# Patient Record
Sex: Female | Born: 1950 | Race: White | Hispanic: No | Marital: Married | Smoking: Never smoker
Health system: Southern US, Community
[De-identification: ages and names within clinical notes are randomized; demographics above are authoritative.]

## PROBLEM LIST (undated history)

## (undated) DIAGNOSIS — D649 Anemia, unspecified: Secondary | ICD-10-CM

## (undated) DIAGNOSIS — M1990 Unspecified osteoarthritis, unspecified site: Secondary | ICD-10-CM

## (undated) DIAGNOSIS — M839 Adult osteomalacia, unspecified: Secondary | ICD-10-CM

## (undated) DIAGNOSIS — T7840XA Allergy, unspecified, initial encounter: Secondary | ICD-10-CM

## (undated) HISTORY — DX: Unspecified osteoarthritis, unspecified site: M19.90

## (undated) HISTORY — PX: TONSILLECTOMY: SUR1361

## (undated) HISTORY — PX: BREAST SURGERY: SHX581

## (undated) HISTORY — DX: Anemia, unspecified: D64.9

## (undated) HISTORY — DX: Adult osteomalacia, unspecified: M83.9

## (undated) HISTORY — DX: Allergy, unspecified, initial encounter: T78.40XA

## (undated) HISTORY — PX: FOOT SURGERY: SHX648

## (undated) HISTORY — PX: ECTOPIC PREGNANCY SURGERY: SHX613

---

## 1980-09-10 HISTORY — PX: OTHER SURGICAL HISTORY: SHX169

## 1999-09-27 ENCOUNTER — Other Ambulatory Visit: Admission: RE | Admit: 1999-09-27 | Discharge: 1999-09-27 | Payer: Self-pay | Admitting: Podiatry

## 2000-12-18 ENCOUNTER — Other Ambulatory Visit: Admission: RE | Admit: 2000-12-18 | Discharge: 2000-12-18 | Payer: Self-pay | Admitting: Obstetrics and Gynecology

## 2001-01-01 ENCOUNTER — Encounter: Admission: RE | Admit: 2001-01-01 | Discharge: 2001-01-01 | Payer: Self-pay | Admitting: Family Medicine

## 2001-01-01 ENCOUNTER — Encounter: Payer: Self-pay | Admitting: Family Medicine

## 2002-03-03 ENCOUNTER — Other Ambulatory Visit: Admission: RE | Admit: 2002-03-03 | Discharge: 2002-03-03 | Payer: Self-pay | Admitting: Obstetrics and Gynecology

## 2003-03-17 ENCOUNTER — Other Ambulatory Visit: Admission: RE | Admit: 2003-03-17 | Discharge: 2003-03-17 | Payer: Self-pay | Admitting: Obstetrics and Gynecology

## 2004-05-18 ENCOUNTER — Other Ambulatory Visit: Admission: RE | Admit: 2004-05-18 | Discharge: 2004-05-18 | Payer: Self-pay | Admitting: Obstetrics and Gynecology

## 2004-07-13 ENCOUNTER — Ambulatory Visit: Payer: Self-pay | Admitting: Internal Medicine

## 2004-07-27 ENCOUNTER — Ambulatory Visit: Payer: Self-pay | Admitting: Internal Medicine

## 2004-08-31 ENCOUNTER — Encounter (INDEPENDENT_AMBULATORY_CARE_PROVIDER_SITE_OTHER): Payer: Self-pay | Admitting: *Deleted

## 2004-08-31 ENCOUNTER — Ambulatory Visit (HOSPITAL_COMMUNITY): Admission: RE | Admit: 2004-08-31 | Discharge: 2004-08-31 | Payer: Self-pay | Admitting: Obstetrics and Gynecology

## 2004-09-07 ENCOUNTER — Ambulatory Visit: Payer: Self-pay | Admitting: Family Medicine

## 2005-05-24 ENCOUNTER — Other Ambulatory Visit: Admission: RE | Admit: 2005-05-24 | Discharge: 2005-05-24 | Payer: Self-pay | Admitting: Obstetrics and Gynecology

## 2005-10-30 ENCOUNTER — Ambulatory Visit: Payer: Self-pay | Admitting: Family Medicine

## 2006-05-27 ENCOUNTER — Other Ambulatory Visit: Admission: RE | Admit: 2006-05-27 | Discharge: 2006-05-27 | Payer: Self-pay | Admitting: Obstetrics and Gynecology

## 2006-08-16 ENCOUNTER — Ambulatory Visit: Payer: Self-pay | Admitting: Internal Medicine

## 2006-10-11 ENCOUNTER — Ambulatory Visit: Payer: Self-pay | Admitting: Internal Medicine

## 2006-10-17 ENCOUNTER — Ambulatory Visit: Payer: Self-pay | Admitting: Internal Medicine

## 2006-10-29 ENCOUNTER — Encounter (INDEPENDENT_AMBULATORY_CARE_PROVIDER_SITE_OTHER): Payer: Self-pay | Admitting: *Deleted

## 2006-10-29 ENCOUNTER — Ambulatory Visit: Payer: Self-pay | Admitting: Internal Medicine

## 2006-11-28 ENCOUNTER — Ambulatory Visit: Payer: Self-pay | Admitting: Internal Medicine

## 2007-09-09 ENCOUNTER — Ambulatory Visit: Payer: Self-pay | Admitting: Family Medicine

## 2007-09-09 DIAGNOSIS — J069 Acute upper respiratory infection, unspecified: Secondary | ICD-10-CM | POA: Insufficient documentation

## 2007-09-09 DIAGNOSIS — J040 Acute laryngitis: Secondary | ICD-10-CM | POA: Insufficient documentation

## 2007-09-09 DIAGNOSIS — B9689 Other specified bacterial agents as the cause of diseases classified elsewhere: Secondary | ICD-10-CM | POA: Insufficient documentation

## 2008-03-17 ENCOUNTER — Ambulatory Visit: Payer: Self-pay | Admitting: Family Medicine

## 2008-03-17 DIAGNOSIS — J309 Allergic rhinitis, unspecified: Secondary | ICD-10-CM | POA: Insufficient documentation

## 2008-03-17 DIAGNOSIS — M129 Arthropathy, unspecified: Secondary | ICD-10-CM | POA: Insufficient documentation

## 2008-03-17 DIAGNOSIS — K644 Residual hemorrhoidal skin tags: Secondary | ICD-10-CM | POA: Insufficient documentation

## 2008-09-10 DIAGNOSIS — M839 Adult osteomalacia, unspecified: Secondary | ICD-10-CM

## 2008-09-10 HISTORY — DX: Adult osteomalacia, unspecified: M83.9

## 2008-09-26 LAB — HM DEXA SCAN

## 2009-05-27 LAB — HM COLONOSCOPY: HM Colonoscopy: NORMAL

## 2009-10-14 ENCOUNTER — Telehealth: Payer: Self-pay | Admitting: Family Medicine

## 2009-10-19 ENCOUNTER — Ambulatory Visit: Payer: Self-pay | Admitting: Family Medicine

## 2010-05-02 LAB — HM DEXA SCAN

## 2010-10-12 NOTE — Assessment & Plan Note (Signed)
Summary: Madison Rowe // RS  Nurse Visit   Allergies: 1)  ! Codeine  Immunizations Administered:  Zostavax # 1:    Vaccine Type: Zostavax    Site: left deltoid    Mfr: Merck    Dose: 0.5 ml    Route: [place]    Given by: Pura Spice, RN    Exp. Date: 09/28/2010    Lot #: [id number]  Orders Added: 1)  Zoster (Shingles) Vaccine Live [90736] 2)  Admin 1st Vaccine [03474]

## 2010-10-12 NOTE — Progress Notes (Signed)
Summary: SHINGLES VACCINE (ZOSTAVAX) QUESTION  Phone Note Call from Patient   Caller: Patient [phone] Reason for Call: Talk to Nurse, Talk to Doctor Summary of Call: Pt called to inquire about shingles vaccine (Zostavax).... If not available at [facility] then she would like to come by and p/u RX for same so that she can obtain vaccination at [facility].... Pt would like to be contacted to advise @ [phone].  Initial call taken by: Debbra Riding,  October 14, 2009 11:57 AM  Follow-up for Phone Call        we have them here, she needs to check with her insurance company to see if its covered.  If its not then she will to pay for it in advanced Follow-up by: Alfred Levins, CMA,  October 18, 2009 8:22 AM  Additional Follow-up for Phone Call Additional follow up Details #1::        Pt wants to make sure that the Zostavax vaccine will be available if OV is scheduled.... Can you advise? Additional Follow-up by: Debbra Riding,  October 18, 2009 9:26 AM    Additional Follow-up for Phone Call Additional follow up Details #2::    yes we have plenty Follow-up by: Alfred Levins, CMA,  October 18, 2009 10:00 AM  Additional Follow-up for Phone Call Additional follow up Details #3:: Details for Additional Follow-up Action Taken: Attempted to call pt to schedule appt for inj.... LMTCB so appt can be scheduled..... Debbra Riding, October 18, 2009 12:24PM  Pt came in for appt, adv that she is going to do some checking and she will c/b... pt understands cost of $248 plus fee for administering same.... Pt may ck w/ [facility] to see if it may be cheaper... Will c/b to advise...Marland KitchenMarland KitchenDebbra Riding, February 8, 12:29PM  Additional Follow-up by: Debbra Riding,  October 18, 2009 12:25 PM   Appended Document: SHINGLES VACCINE (ZOSTAVAX) QUESTION make appt and come in on 2/9 for shingles injection

## 2010-11-28 ENCOUNTER — Encounter: Payer: Self-pay | Admitting: Internal Medicine

## 2010-11-29 ENCOUNTER — Ambulatory Visit (INDEPENDENT_AMBULATORY_CARE_PROVIDER_SITE_OTHER): Payer: BC Managed Care – PPO | Admitting: Internal Medicine

## 2010-11-29 ENCOUNTER — Encounter: Payer: Self-pay | Admitting: Internal Medicine

## 2010-11-29 DIAGNOSIS — J40 Bronchitis, not specified as acute or chronic: Secondary | ICD-10-CM

## 2010-11-29 MED ORDER — DOXYCYCLINE HYCLATE 100 MG PO TABS: 100.0000 mg | ORAL_TABLET | Freq: Two times a day (BID) | ORAL | Status: AC

## 2010-12-15 ENCOUNTER — Encounter: Payer: Self-pay | Admitting: Internal Medicine

## 2010-12-15 DIAGNOSIS — J40 Bronchitis, not specified as acute or chronic: Secondary | ICD-10-CM | POA: Insufficient documentation

## 2010-12-15 NOTE — Progress Notes (Signed)
  Subjective:    Patient ID: Madison Rowe, female    DOB: [birth year], 60 y.o.   MRN: [id number]  HPI Pt presents to clinic for evaluation of URI sx's. Notes 2wk h/o nasal congestion/drainage and cough productive for green sputum. Denies hemoptysis, wheezing or dyspnea. No allevaiting or exacerbating factors. No sick exposures.    Review of Systems  Constitutional: Negative for fever, chills and fatigue.  HENT: Positive for congestion and rhinorrhea. Negative for facial swelling and ear discharge.   Eyes: Negative for pain and discharge.  Respiratory: Positive for cough. Negative for shortness of breath and wheezing.        Objective:   Physical Exam  Nursing note and vitals reviewed. Constitutional: She appears well-developed and well-nourished. No distress.  HENT:  Head: Normocephalic and atraumatic.  Right Ear: Tympanic membrane, external ear and ear canal normal.  Left Ear: Tympanic membrane, external ear and ear canal normal.  Nose: Nose normal.  Mouth/Throat: Oropharynx is clear and moist. No oropharyngeal exudate.  Eyes: Conjunctivae are normal. Right eye exhibits no discharge. Left eye exhibits no discharge. No scleral icterus.  Neck: Neck supple.  Cardiovascular: Normal rate, regular rhythm and normal heart sounds.  Exam reveals no gallop and no friction rub.   No murmur heard. Pulmonary/Chest: Effort normal and breath sounds normal. No respiratory distress. She has no wheezes. She has no rales.  Lymphadenopathy:    She has no cervical adenopathy.  Skin: Skin is warm and dry. No rash noted. She is not diaphoretic. No erythema.          Assessment & Plan:

## 2010-12-15 NOTE — Assessment & Plan Note (Signed)
Begin doxycycline. F/u if no improvement or worsening.

## 2011-01-26 NOTE — Assessment & Plan Note (Signed)
[facility]                         GASTROENTEROLOGY OFFICE NOTE   NAME:Furia, JAMAL HASKIN                      MRN:          [id number]  DATE:11/28/2006                            DOB:          1951-08-02    HISTORY:  Madison Rowe presents today for a follow-up.  She is a 60 year old  who was evaluated October 11, 2006, for Hemoccult-positive stool and  anemia.  See that dictation for details.  She subsequently underwent  colonoscopy and upper endoscopy October 29, 2006.  Colonoscopy revealed  sigmoid diverticulosis.  No other abnormalities.  The terminal ileum was  normal.  Follow-up colonoscopy in 10 years recommended.  Upper endoscopy  revealed evidence of esophagitis as well as an esophageal stricture and  a small hiatal hernia.  She was also noted to have gastritis and  duodenitis likely secondary to nonsteroidal anti-inflammatory drugs.  There was no evidence of Helicobacter pylori.  She was treated with  Prevacid 30 mg daily as well as iron supplementation.  She was asked to  follow up at this time.  It should be noted that her small bowel biopsy  was normal.  Since her procedure she reports feeling well.  She  apparently had been able to give blood to the [employer].  This would  signify that her anemia has resolved.  She never really did have much in  the way of reflux symptoms.  She does report some intermittent  dysphagia.  No other complaints.  She does have a number of questions  regarding each of the procedures.  We went over each procedure  thoroughly and answered all of her questions.   PHYSICAL EXAMINATION:  GENERAL:  A well-appearing female in no acute  distress.  VITAL SIGNS:  Blood pressure is 128/70.  Her heart rate is 80 and  regular.  Weight is 193.4 pounds.  ABDOMEN:  Not reexamined.   IMPRESSION:  1. 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  Possible gastrointestinal      contributions secondary to nonsteroidal anti-inflammatory drug-      induced mucosal lesions.  2. Gastroesophageal reflux disease with endoscopic evidence of      esophagitis and a stricture.  3. Incidental diverticulosis.   RECOMMENDATIONS:  1. Continue Prevacid 30 mg daily.  This to address her esophagitis as      well as gastroduodenal inflammation.  2. Continue iron supplementation -- indefinitely as long as the      patient plans on continuing her blood doner activity.  3. Consider endoscopy with esophageal dilation should her dysphagia      become significant or persistent despite medication.  Otherwise,      routine office follow-up in one year.     Madison Rowe. Marina Goodell, MD  Electronically Signed    JNP/MedQ  DD: 11/28/2006  DT: 11/28/2006  Job #: [id number]   cc:   Huel Cote, M.D.

## 2011-01-26 NOTE — H&P (Signed)
[name], Rowe               ACCOUNT NO.:  [id number]   MEDICAL RECORD NO.:  [id number]            PATIENT TYPE:   LOCATION:                                 FACILITY:   PHYSICIAN:  Huel Cote, M.D.      DATE OF BIRTH:   DATE OF ADMISSION:  DATE OF DISCHARGE:                                HISTORY & PHYSICAL   SCHEDULED DATE OF SURGERY:  August 31, 2004 at 8:30 a.m. at the [facility].   Madison Rowe is a 60 year old G1 P0 who is being scheduled for an outpatient  removal of a vaginal lesion and a vulvar biopsy.  The patient was first  noted to have this lesion on her physical exam in September 2005 and had a  new lesion approximately 2-3 cm in length extending down from her midline  which appeared to be benign in nature.  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   PAST MEDICAL HISTORY:  Significant for allergies, arthritis, premature  ovarian failure, and osteopenia.   PAST OBSTETRICAL HISTORY:  She had one ectopic pregnancy and subsequently  adopted two children.   PAST GYNECOLOGICAL HISTORY:  Foot surgery.  She had an ectopic with a  salpingo-oophorectomy, a tuboplasty, laparoscopies x10, cystectomy, and a  lumpectomy.   PAST SURGICAL HISTORY:  No abnormal Pap smears.   FAMILY HISTORY:  Significant for no breast cancer or colon cancer or heart  disease.   Her health maintenance is up to date.   CURRENT MEDICATIONS:  1.  Prempro 0.3/1.5.  2.  Premarin cream.   ALLERGIES:  CODEINE.   PHYSICAL EXAMINATION:  VITAL SIGNS:  Weight 184 pounds, blood pressure is  130/90.  CARDIAC:  Regular rate and rhythm.  LUNGS:  Clear.  ABDOMEN:  Soft and nontender.  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  Cervix  has no lesions and there is this anterior vaginal wall mass which  extends  down from the midline approximately 2-3 cm in length.  The uterus is normal  size and adnexa have no masses.   The patient was counseled as to her options including removal and expectant  management of this vaginal lesion.  She feels that it has grown in size over  the past several months and is desirous of complete removal if possible.  Also, we will be proceeding with a vulvar biopsy to confirm the diagnosis of  lichen sclerosis which she treats with triamcinolone cream with some  success.  I have discussed with her the risks of bleeding and infection and  possible damage to bladder.  The patient understands these risks and desires  to proceed with surgery as stated.     Madison Rowe   KR/MEDQ  D:  08/28/2004  T:  08/28/2004  Job:  [id number]

## 2011-01-26 NOTE — Op Note (Signed)
NAME:  Madison Rowe, Madison Rowe               ACCOUNT NO.:  [id number]   MEDICAL RECORD NO.:  [id number]          PATIENT TYPE:  AMB   LOCATION:  DAY                          FACILITY:  [facility]   PHYSICIAN:  Huel Cote, M.D. DATE OF BIRTH:  1951-03-07   DATE OF PROCEDURE:  08/31/2004  DATE OF DISCHARGE:                                 OPERATIVE REPORT   PREOPERATIVE DIAGNOSES:  1.  Anterior vaginal wall mass.  2.  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   POSTOPERATIVE DIAGNOSES:  1.  Anterior vaginal wall mass.  2.  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  3.  Cystoscope confirming urethral diverticulum.   PROCEDURE:  1.  Excision of vaginal wall mass.  2.  Vulvar lesion.  3.  Cystoscope.   SURGEON:  Huel Cote, M.D. with Lynelle Smoke I. Patsi Sears, M.D. performing  the cystoscope.   ANESTHESIA:  LMAC.   SPECIMENS:  Vaginal wall lesion and a vulva biopsy.   ESTIMATED BLOOD LOSS:  50 mL.   COMPLICATIONS:  None.   DESCRIPTION OF PROCEDURE:  The patient was taken to the operating room where  [facility] anesthesia was obtained without difficulty.  She was then prepped and  draped in the normal sterile fashion in the dorsal lithotomy position.  The  patient was then carefully examined and it was noted that the previous  anterior vaginal mass was substantially larger in size and edematous. This  was traced back to its base and off a small stalk adjacent to the bladder  neck possibly. Given its close proximity to the bladder neck though it did  not appear to be confluent with the urethra.  Dr. Patsi Sears, urology, was  called into the room and agreed that the patient should undergo a cystoscope  after removal to confirm no urethral diverticulum present.  Therefore the  patient was numbed with 1% lidocaine with epinephrine all along the base of  the lesion and it was excised with Mayo scissors. The defect left was very  small and was able to be closed with two interrupted sutures of  2-0 Vicryl  with minimal bleeding.  The mass was then handed off to pathology and  attention was turned to the patient's vulva. A small 2 mm biopsy was  obtained in an area that looked consistent with Lichen sclerosis for a  confirmatory diagnosis and this was also closed with two interrupted sutures  of 2-0 Vicryl. At this point, Dr. Patsi Sears came to the room and performed  a flexible  cystoscopy and noted no defects in the urethra which was consistent with  being an anterior vaginal wall cyst or lesion which had possibly torsed and  become more edematous.  At this point, all instruments and sponges were  removed from the patient's vagina and she was awakened and taken to the  recovery room in stable condition.     Gary Fleet   KR/MEDQ  D:  08/31/2004  T:  08/31/2004  Job:  [id number]

## 2011-01-26 NOTE — Consult Note (Signed)
NAME:  Madison Rowe, Madison Rowe               ACCOUNT NO.:  [id number]   MEDICAL RECORD NO.:  [id number]          PATIENT TYPE:  AMB   LOCATION:  DAY                          FACILITY:  [facility]   PHYSICIAN:  Sigmund I. Patsi Sears, M.D.DATE OF BIRTH:  1950-10-22   DATE OF CONSULTATION:  08/31/2004  DATE OF DISCHARGE:                                   CONSULTATION   SUBJECTIVE:  Patient with torsion of vaginal mass for excision today.  The  area of the space of the abnormality appears to be attached to the urethra  and intraoperative consultation for possible urethral diverticulum was  obtained by Dr. Senaida Ores.   OBJECTIVE:  Examination today shows a 2 cm x 1 cm edematous cystic mass,  torsed, with the base thickened, and appears to be attached to the vaginal  epithelium overlying the urethra. There is no urethral excrescence noted.  There is no history of urinary tract infections or stone.   Cystoscopy is accomplished, and shows that the patient has a normal  appearing urethra, normal bladder neck. The ureteral orifices were in normal  position, with clear efflux from both orifices. There is no evidence of  bladder stone, tumor or bladder diverticular formation. There is no urethral  diverticulum.  The scope is removed. The patient then was awakened and taken  to the recovery room in good condition.     Sigm   SIT/MEDQ  D:  08/31/2004  T:  08/31/2004  Job:  [id number]   cc:   Huel Cote, M.D.  [address] [place], [address]  [place], [place] [address]  Fax: [phone]

## 2011-01-26 NOTE — Assessment & Plan Note (Signed)
[facility]                         GASTROENTEROLOGY OFFICE NOTE   NAME:Madison Rowe, Madison Rowe                      MRN:          [id number]  DATE:10/11/2006                            DOB:          1951-03-10    REFERRING PHYSICIAN:  Huel Cote, M.D.   REASON FOR CONSULTATION:  Hemoccult-positive stool.   HISTORY:  This is a pleasant, 60 year old white female with no  significant past medical history.  She is referred through the courtesy  of Dr. Senaida Ores regarding Hemoccult-positive stool.  The patient  reports long-standing history of blood donation to the [employer].  In  recent months, she was declined giving blood, due to anemia.  She was  found to have iron deficiency and placed on iron supplementation.  She  recently underwent home Hemoccult testing with three of three studies  returning positive.  She does take occasional nonsteroidal anti-  inflammatory drugs and aspirin.  She was using these prior to stool  testing.   Her GI review of systems is quite unremarkable.  No nausea, vomiting,  heartburn, indigestion, dysphagia, abdominal pain, melena, hematochezia,  constipation, diarrhea or weight-loss.  She has noticed trivial amounts  of red blood on the tissue with defecation.  The patient did undergo  routine screening colonoscopy in November of 2005.  The examination was  normal, except for mild diverticulosis.   PAST MEDICAL HISTORY:  Unremarkable.   PAST SURGICAL HISTORY:  1. Foot surgeries.  2. Laparoscopy.  3. Tuboplasty.  4. Lumpectomy.   ALLERGIES:  CODEINE.   CURRENT MEDICATIONS:  Prempro, Premarin cream, buffered aspirin,  Citrucel, and iron sulfate.   SOCIAL HISTORY:  The patient is married with two children.  She is an  Programmer, systems.  She does not smoke.  Occasionally uses alcohol.   FAMILY HISTORY:  Negative for gastrointestinal malignancy.   PHYSICAL EXAM:  Well-appearing female in no acute distress.  Blood  pressure is 118/64, heart rate is 84, weight is 195 pounds.  HEENT:  Sclerae are anicteric.  Conjunctivae are pink.  Oral mucosa  intact.  LUNGS:  Clear.  HEART:  Regular.  ABDOMEN:  Soft, without tenderness, masses or hernia.  EXTREMITIES:  Without edema.   IMPRESSION:  This is a 60 year old female, who presents regarding iron  deficiency anemia and Hemoccult-positive stool.  Negative screening  colonoscopy a little over two years ago.  Though it is quite conceivable  that her occult-positive stool is due to minor GI mucosal irritation  from aspirin and nonsteroidals and that her iron deficiency anemia is  due to chronic blood donation to the [employer], a significant underlying  gastrointestinal mucosal lesion must be excluded.  However, this seems  somewhat less likely, given her negative review of systems and physical  exam.  In any event, I think it is important that we proceed to exclude  such a process.   RECOMMENDATIONS:  1. Colonoscopy and upper endoscopy.  The nature of the procedures, as      well as the risks, benefits, and alternatives have been reviewed.      She understood and agreed to proceed.  2. Continue iron therapy, as long as the patient plans to continue as      a blood donor.     Wilhemina Bonito. Marina Goodell, MD  Electronically Signed    JNP/MedQ  DD: 10/11/2006  DT: 10/11/2006  Job #: [id number]   cc:   Huel Cote, M.D.

## 2011-07-28 ENCOUNTER — Encounter: Payer: Self-pay | Admitting: Family Medicine

## 2011-07-28 ENCOUNTER — Ambulatory Visit (INDEPENDENT_AMBULATORY_CARE_PROVIDER_SITE_OTHER): Payer: BC Managed Care – PPO | Admitting: Family Medicine

## 2011-07-28 VITALS — BP 118/76 | HR 80 | Temp 97.5°F | Wt 183.8 lb

## 2011-07-28 DIAGNOSIS — J069 Acute upper respiratory infection, unspecified: Secondary | ICD-10-CM

## 2011-07-28 MED ORDER — BENZONATATE 100 MG PO CAPS: 100.0000 mg | ORAL_CAPSULE | Freq: Three times a day (TID) | ORAL | Status: DC | PRN

## 2011-07-28 MED ORDER — FLUTICASONE PROPIONATE 50 MCG/ACT NA SUSP: 2.0000 | Freq: Every day | NASAL | Status: DC

## 2011-07-28 NOTE — Progress Notes (Signed)
  Subjective:    Patient ID: Madison Rowe, female    DOB: 1951/02/24, 60 y.o.   MRN: [id number]  HPI CC: cold?  5d h/o ST, drainage down back of throat, now with coughing and congestion.  Feeling fatigued.  ST continues to bother her.  Minimal HA.  Cough slightly productive - started last night.  Has tried mucinex, nyquil, benadryl, cepacol throat spray and lozenges.  No fevers/chills, abd pain, n/v/d, rashes, myalgia, arthralgia.    No sick contacts at home.  No smokers at home.  No asthma/copd.  + seasonal allergies.  Review of Systems Per HPI    Objective:   Physical Exam  Vitals reviewed. Constitutional: She appears well-developed and well-nourished. No distress.  HENT:  Head: Normocephalic and atraumatic.  Right Ear: Hearing, external ear and ear canal normal.  Left Ear: Hearing, tympanic membrane, external ear and ear canal normal.  Nose: Mucosal edema (significantly swollen R turbinate) present. No rhinorrhea. Right sinus exhibits no maxillary sinus tenderness and no frontal sinus tenderness. Left sinus exhibits no maxillary sinus tenderness and no frontal sinus tenderness.  Mouth/Throat: Uvula is midline and mucous membranes are normal. Posterior oropharyngeal erythema present. No oropharyngeal exudate, posterior oropharyngeal edema or tonsillar abscesses.       R cerumen impaction, asxs  Eyes: Conjunctivae and EOM are normal. Pupils are equal, round, and reactive to light. No scleral icterus.  Neck: Normal range of motion. Neck supple. No JVD present. No thyromegaly present.  Cardiovascular: Normal rate, regular rhythm, normal heart sounds and intact distal pulses.   No murmur heard. Pulmonary/Chest: Effort normal and breath sounds normal. No respiratory distress. She has no wheezes. She has no rales.  Lymphadenopathy:    She has no cervical adenopathy.  Skin: Skin is warm and dry. No rash noted.  Psychiatric: She has a normal mood and affect.       Assessment &  Plan:

## 2011-07-28 NOTE — Patient Instructions (Signed)
Sounds like you have a viral upper respiratory infection. Antibiotics are not needed for this.  Viral infections usually take 7-10 days to resolve.  The cough can a few several weeks to go away. Use medication as prescribed: flonase for nasal swelling. Take ibuprofen for throat discomfort and nasal saline irrigation throughout the day. Continue nyquil and mucinex. Push fluids and plenty of rest. Please return if you are not improving as expected, or if you have high fevers (>101.5) or difficulty swallowing or worsening productive cough. Call clinic with questions.  Good to see you today.

## 2011-07-28 NOTE — Assessment & Plan Note (Signed)
Likely viral urti with cough. See pt instructions. Update [place] if not improving as expected.

## 2011-07-30 ENCOUNTER — Telehealth: Payer: Self-pay

## 2011-07-30 NOTE — Telephone Encounter (Signed)
[facility] Triage Call Report Triage Record Num: [id number] Operator: Ether Griffins Patient Name: Madison Rowe Call Date & Time: 07/28/2011 10:53:56AM Patient Phone: [phone] PCP: Rickard Patience Patient Gender: Female PCP Fax : [phone] Patient DOB: 14-Jun-1951 Practice Name: [facility] Reason for Call: Caller: Leonora/Patient; PCP: Dianna Limbo [facility]); CB#: [phone]; Call Reason: Sore Throat; Sx Onset: 07/24/2011; Sx Notes:calling about sore throat,sinus drainage,cough, no energy. Afebrile. Home treatment(s) tried: Benadryl, Nyquil and Advil sore throat spray; Did home treatment help?: No.Guideline Used:All emergent sxs of Sore throat r/o.Advised to call scheduling line at office for appt today. Protocol(s) Used: Sore Throat or Hoarseness Recommended Outcome per Protocol: See Provider within 24 hours Reason for Outcome: Exposure to an individual with diagnosed strep throat but who has not completed 24 hours of antibiotic therapy Care Advice: Call provider if any of the following signs and symptoms develop: severe sore throat, enlarged tonsils with white or yellow patches, tender swollen glands, fever, generally feel ill, persistent low grade headache, or abdominal pain. ~ ~ SYMPTOM / CONDITION MANAGEMENT ~ INFECTION CONTROL ~ CAUTIONS Most adults need to drink 6-10 eight-ounce glasses (1.2-2.0 liters) of fluids per day unless previously told to limit fluid intake for other medical reasons. Limit fluids that contain caffeine, sugar or alcohol. Urine will be a very light yellow color when you drink enough fluids. ~ Sore Throat Relief: - Use warm salt water gargles 3 to 4 times/day, as needed (1/2 tsp. salt in 8 oz. [.2 liters] water). - Suck on hard candy, nonprescription or herbal throat lozenges (sugar-free if diabetic) - Eat soothing, soft food/fluids (broths, soups, or honey and lemon juice in hot tea, Popsicles, frozen yogurt or sherbet, scrambled eggs,  cooked cereals, Jell-O or puddings) whichever is most comforting. - Avoid eating salty, spicy or acidic foods. ~ 07/28/2011 11:07:04AM Page 1 of 1 [id number]

## 2011-08-13 ENCOUNTER — Encounter: Payer: Self-pay | Admitting: Internal Medicine

## 2011-08-13 DIAGNOSIS — Z0000 Encounter for general adult medical examination without abnormal findings: Secondary | ICD-10-CM | POA: Insufficient documentation

## 2011-08-14 ENCOUNTER — Encounter: Payer: BC Managed Care – PPO | Admitting: Internal Medicine

## 2011-08-14 ENCOUNTER — Encounter: Payer: Self-pay | Admitting: Internal Medicine

## 2011-08-14 ENCOUNTER — Ambulatory Visit (INDEPENDENT_AMBULATORY_CARE_PROVIDER_SITE_OTHER): Payer: BC Managed Care – PPO | Admitting: Endocrinology

## 2011-08-14 VITALS — BP 102/64 | HR 78 | Temp 97.9°F | Ht 64.0 in | Wt 182.5 lb

## 2011-08-14 DIAGNOSIS — J069 Acute upper respiratory infection, unspecified: Secondary | ICD-10-CM

## 2011-08-14 MED ORDER — AZITHROMYCIN 500 MG PO TABS: 500.0000 mg | ORAL_TABLET | Freq: Every day | ORAL | Status: AC

## 2011-08-14 NOTE — Progress Notes (Signed)
  Subjective:    Patient ID: Madison Rowe, female    DOB: 02-21-1951, 60 y.o.   MRN: [id number]  HPI Pt states few weeks of moderate congestion in the nose, and assoc sore throat.  The sore throat is improved, but the nasal congestion persists.   Past Medical History  Diagnosis Date  . Allergy   . Arthritis     Past Surgical History  Procedure Date  . Breast surgery   . Ectopic pregnancy surgery   . Tonsillectomy     History   Social History  . Marital Status: Married    Spouse Name: N/A    Number of Children: N/A  . Years of Education: N/A   Occupational History  . Not on file.   Social History Main Topics  . Smoking status: Never Smoker   . Smokeless tobacco: Not on file  . Alcohol Use: Yes     Occasional  . Drug Use: No  . Sexually Active: Not on file   Other Topics Concern  . Not on file   Social History Narrative  . No narrative on file    Current Outpatient Prescriptions on File Prior to Visit  Medication Sig Dispense Refill  . benzonatate (TESSALON PERLES) 100 MG capsule Take 1 capsule (100 mg total) by mouth 3 (three) times daily as needed for cough.  30 capsule  0  . Calcium Carbonate-Vitamin D (CALCIUM + D PO) Take by mouth 2 (two) times daily.        . fluticasone (FLONASE) 50 MCG/ACT nasal spray Place 2 sprays into the nose daily.  16 g  1    Allergies  Allergen Reactions  . Codeine     No family history on file.  There were no vitals taken for this visit.    Review of Systems Denies fever or cough    Objective:   Physical Exam  BP  Pulse  Temp(Src)  Ht  Wt  BMI   102/64  78  97.9 F (36.6 C) (Oral)  5\' 4"  (1.626 m)  182 lb 8 oz (82.781 kg)  31.33 kg/m2      VITAL SIGNS:  See vs page GENERAL: no distress head: no deformity eyes: no periorbital swelling, no proptosis external nose and ears are normal mouth: no lesions Both tm's are slightly red NECK: There is no palpable thyroid enlargement.  No thyroid nodule is palpable.  No  palpable lymphadenopathy at the anterior neck       Assessment & Plan:  Glenford Peers, new

## 2011-08-14 NOTE — Patient Instructions (Signed)
i have sent a prescription to your pharmacy, for an antibiotic pill. Loratadine-d (non-prescription) will help your congestion. I hope you feel better soon.  If you don't feel better by next week, please call dr Jonny Ruiz.

## 2011-09-09 NOTE — Progress Notes (Signed)
  Subjective:    Patient ID: Madison Rowe, female    DOB: [birth year], 60 y.o.   MRN: [id number]  HPI error    Review of Systems     Objective:   Physical Exam        Assessment & Plan:

## 2011-09-17 ENCOUNTER — Ambulatory Visit: Payer: BC Managed Care – PPO | Admitting: Internal Medicine

## 2011-09-27 ENCOUNTER — Encounter: Payer: Self-pay | Admitting: Internal Medicine

## 2011-09-27 ENCOUNTER — Other Ambulatory Visit (INDEPENDENT_AMBULATORY_CARE_PROVIDER_SITE_OTHER): Payer: BC Managed Care – PPO

## 2011-09-27 ENCOUNTER — Ambulatory Visit (INDEPENDENT_AMBULATORY_CARE_PROVIDER_SITE_OTHER): Payer: BC Managed Care – PPO | Admitting: Internal Medicine

## 2011-09-27 DIAGNOSIS — E785 Hyperlipidemia, unspecified: Secondary | ICD-10-CM | POA: Insufficient documentation

## 2011-09-27 DIAGNOSIS — D539 Nutritional anemia, unspecified: Secondary | ICD-10-CM | POA: Insufficient documentation

## 2011-09-27 DIAGNOSIS — D649 Anemia, unspecified: Secondary | ICD-10-CM

## 2011-09-27 DIAGNOSIS — E782 Mixed hyperlipidemia: Secondary | ICD-10-CM

## 2011-09-27 DIAGNOSIS — M810 Age-related osteoporosis without current pathological fracture: Secondary | ICD-10-CM

## 2011-09-27 DIAGNOSIS — J309 Allergic rhinitis, unspecified: Secondary | ICD-10-CM

## 2011-09-27 LAB — CBC WITH DIFFERENTIAL/PLATELET
Basophils Relative: 0.8 % (ref 0.0–3.0)
Eosinophils Relative: 1.3 % (ref 0.0–5.0)
HCT: 34.8 % — ABNORMAL LOW (ref 36.0–46.0)
Hemoglobin: 11.9 g/dL — ABNORMAL LOW (ref 12.0–15.0)
MCV: 84.9 fl (ref 78.0–100.0)
Monocytes Absolute: 0.5 10*3/uL (ref 0.1–1.0)
Neutro Abs: 2.7 10*3/uL (ref 1.4–7.7)
Neutrophils Relative %: 55.6 % (ref 43.0–77.0)
RBC: 4.09 Mil/uL (ref 3.87–5.11)
WBC: 4.9 10*3/uL (ref 4.5–10.5)

## 2011-09-27 LAB — FOLATE: Folate: 16.7 ng/mL (ref >5.9)

## 2011-09-27 LAB — COMPREHENSIVE METABOLIC PANEL
ALT: 15 U/L (ref 0–35)
AST: 18 U/L (ref 0–37)
Albumin: 4.1 g/dL (ref 3.5–5.2)
BUN: 18 mg/dL (ref 6–23)
Calcium: 9.1 mg/dL (ref 8.4–10.5)
Chloride: 103 mEq/L (ref 96–112)
Potassium: 4.5 mEq/L (ref 3.5–5.1)

## 2011-09-27 LAB — LDL CHOLESTEROL, DIRECT: Direct LDL: 122.7 mg/dL

## 2011-09-27 LAB — LIPID PANEL: HDL: 65.6 mg/dL (ref >39.00)

## 2011-09-27 MED ORDER — CETIRIZINE-PSEUDOEPHEDRINE ER 5-120 MG PO TB12: 1.0000 | ORAL_TABLET | Freq: Two times a day (BID) | ORAL | Status: DC

## 2011-09-27 MED ORDER — FLUTICASONE PROPIONATE 50 MCG/ACT NA SUSP: 2.0000 | Freq: Every day | NASAL | Status: DC

## 2011-09-27 MED ORDER — IBANDRONATE SODIUM 150 MG PO TABS: 150.0000 mg | ORAL_TABLET | ORAL | Status: AC

## 2011-09-27 NOTE — Patient Instructions (Signed)
Anemia, Nonspecific Your exam and blood tests show you are anemic. This means your blood (hemoglobin) level is low. Normal hemoglobin values are 12 to 15 g/dL for females and 14 to 17 g/dL for males. Make a note of your hemoglobin level today. The hematocrit percent is also used to measure anemia. A normal hematocrit is 38% to 46% in females and 42% to 49% in males. Make a note of your hematocrit level today. CAUSES  Anemia can be due to many different causes.  Excessive bleeding from periods (in women).   Intestinal bleeding.   Poor nutrition.   Kidney, thyroid, liver, and bone marrow diseases.  SYMPTOMS  Anemia can come on suddenly (acute). It can also come on slowly. Symptoms can include:  Minor weakness.   Dizziness.   Palpitations.   Shortness of breath.  Symptoms may be absent until half your hemoglobin is missing if it comes on slowly. Anemia due to acute blood loss from an injury or internal bleeding may require blood transfusion if the loss is severe. Hospital care is needed if you are anemic and there is significant continual blood loss. TREATMENT   Stool tests for blood (Hemoccult) and additional lab tests are often needed. This determines the best treatment.   Further checking on your condition and your response to treatment is very important. It often takes many weeks to correct anemia.  Depending on the cause, treatment can include:  Supplements of iron.   Vitamins B12 and folic acid.   Hormone medicines.If your anemia is due to bleeding, finding the cause of the blood loss is very important. This will help avoid further problems.  SEEK IMMEDIATE MEDICAL CARE IF:   You develop fainting, extreme weakness, shortness of breath, or chest pain.   You develop heavy vaginal bleeding.   You develop bloody or black, tarry stools or vomit up blood.   You develop a high fever, rash, repeated vomiting, or dehydration.  Document Released: 10/04/2004 Document Revised:  05/09/2011 Document Reviewed: 07/12/2009 [facility] Patient Information 2012 [place], [place].Osteoporosis Osteoporosis is a disease of the bones that makes them weaker and prone to break (fracture). By their mid-30s, most people begin to gradually lose bone strength. If this is severe enough, osteoporosis may occur. Osteopenia is a less severe weakness of the bones, which places you at risk for osteoporosis. It is important to identify if you have osteoporosis or osteopenia. Bone fractures from osteoporosis (especially hip and spine fractures) are a major cause of hospitalization, loss of independence, and can lead to life-threatening complications. CAUSES  There are a number of causes and risk factors:  Gender. Women are at a higher risk for osteoporosis than men.   Age. Bone formation slows down with age.   Ethnicity. For unclear reasons, white and Asian women are at higher risk for osteoporosis. Hispanic and African American women are at increased, but lesser, risk.   Family history of osteoporosis can mean that you are at a higher risk for getting it.   History of bone fractures indicates you may be at higher risk of another.   Calcium is very important for bone health and strength. Not enough calcium in your diet increases your risk for osteoporosis. Vitamin D is important for calcium metabolism. You get vitamin D from sunlight, foods, or supplements.   Physical activity. Bones get stronger with weight-bearing exercise and weaker without use.   Smoking is associated with decreased bone strength.   Medicines. Cortisone medicines, too much thyroid medicine, some cancer  and seizure medicines, and others can weaken bones and cause osteoporosis.   Decreased body weight is associated with osteoporosis. The small amount of estrogen-type molecules produced in fat cells seems to protect the bones.   Menopausal decrease in the hormone estrogen can cause osteoporosis.   Low levels of the hormone  testosterone can cause osteoporosis.   Some medical conditions can lead to osteoporosis (hyperthyroidism, hyperparathyroidism, B12 deficiency).  SYMPTOMS  Usually, no symptoms are felt as the bones weaken. The first symptoms are generally related to bone fractures. You may have silent, tiny bone fractures, especially in your spine. This can cause height loss and forward bending of the spine (kyphosis). DIAGNOSIS  You or your caregiver may suspect osteoporosis based on height loss and kyphosis. Osteoporosis or osteopenia may be identified on an X-ray done for other reasons. A bone density measurement will likely be taken. Your bones are often measured at your lower spine or your hips. Measurement is done by an X-ray called a DEXA scan, or sometimes by a computerized X-ray scan (CT or CAT scan). Other tests may be done to find the cause of osteoporosis, such as blood tests to measure calcium and vitamin D, or to monitor treatment. TREATMENT  The goal of osteoporosis treatment is to prevent fractures. This is done through medicine and home care treatments. Treatment will slow the weakening of your bones and strengthen them where possible. Measures to decrease the likelihood of falling and fracturing a bone are also important. Medicine  You may need supplements if you are not getting enough calcium, vitamin D, and vitamin B12.   If you are female and menopausal, you should discuss the option of estrogen replacement or estrogen-like medicine with your caregiver.   Medicines can be taken by mouth or injection to help build bone strength. When taken by mouth, there are important directions that you need to follow.   Calcitonin is a hormone made by the thyroid gland that can help build bone strength and decrease fracture risk in the spine. It can be taken by nasal spray or injection.   Parathyroid hormone can be injected to help build bone strength.   You will need to continue to get enough calcium  intake with any of these medicines.  FALL PREVENTION  If you are unsteady on your feet, use a cane, walker, or walk with someone's help.   Remove loose rugs or electrical cords from your home.   Keep your home well lit at night. Use glasses if you need them.   Avoid icy streets and wet or waxed floors.   Hold the railing when using stairs.   Watch out for your pets.   Install grab bars in your bathroom.   Exercise. Physical activity, especially weight-bearing exercise, helps strengthen bones. Strength and balance exercise, such as tai chi, helps prevent falls.   Alcohol and some medicines can make you more likely to fall. Discuss alcohol use with your caregiver. Ask your caregiver if any of your medicines might increase your risk for falling. Ask if safer alternatives are available.  HOME CARE INSTRUCTIONS   Try to prevent and avoid falls.   To pick up objects, bend at the knees. Do not bend with your back.   Do not smoke. If you smoke, ask for help to stop.   Have adequate calcium and vitamin D in your diet. Talk with your caregiver about amounts.   Before exercising, ask your caregiver what exercises will be good for you.  Only take over-the-counter or prescription medicines for pain, discomfort, or fever as directed by your caregiver.  SEEK MEDICAL CARE IF:   You have had a fracture and your pain is not controlled.   You have had a fracture and you are not able to return to activities as expected.   You are reinjured.   You develop side effects from medicines, especially stomach pain or trouble swallowing.   You develop new, unexplained problems.  SEEK IMMEDIATE MEDICAL CARE IF:   You develop sudden, severe pain in your back.   You develop pain after an injury or fall.  Document Released: 06/06/2005 Document Revised: 05/09/2011 Document Reviewed: 04/24/2007 [facility] Patient Information 2012 [place], [place].

## 2011-09-27 NOTE — Assessment & Plan Note (Signed)
Check her FLP today 

## 2011-09-27 NOTE — Assessment & Plan Note (Signed)
I will check her CBC today and will check her vitamin levels as well

## 2011-09-27 NOTE — Assessment & Plan Note (Signed)
She will try boniva, today I will check her vit D level and will look at other causes to check for secondary causes

## 2011-09-27 NOTE — Progress Notes (Signed)
Subjective:    Patient ID: Madison Rowe, female    DOB: 06/15/1951, 61 y.o.   MRN: [id number]  Anemia Presents for follow-up visit. Symptoms include malaise/fatigue. There has been no abdominal pain, anorexia, bruising/bleeding easily, confusion, fever, leg swelling, light-headedness, pallor, palpitations, paresthesias, pica or weight loss. Signs of blood loss that are not present include hematemesis, hematochezia, melena and vaginal bleeding. There are no compliance problems.       Review of Systems  Constitutional: Positive for malaise/fatigue. Negative for fever, chills, weight loss, diaphoresis, activity change, appetite change, fatigue and unexpected weight change.  HENT: Positive for congestion, rhinorrhea, sneezing and postnasal drip. Negative for hearing loss, ear pain, nosebleeds, sore throat, facial swelling, drooling, mouth sores, trouble swallowing, neck pain, neck stiffness, dental problem, voice change, sinus pressure, tinnitus and ear discharge.   Eyes: Negative for photophobia, pain, discharge, redness, itching and visual disturbance.  Respiratory: Negative for cough, chest tightness, shortness of breath, wheezing and stridor.   Cardiovascular: Negative for chest pain, palpitations and leg swelling.  Gastrointestinal: Negative for nausea, vomiting, abdominal pain, diarrhea, constipation, melena, hematochezia, abdominal distention, anal bleeding, anorexia and hematemesis.  Genitourinary: Negative for dysuria, urgency, frequency, hematuria, decreased urine volume, vaginal bleeding, enuresis, difficulty urinating and dyspareunia.  Musculoskeletal: Negative for myalgias, back pain, joint swelling, arthralgias and gait problem.  Skin: Negative for color change, pallor, rash and wound.  Neurological: Negative for dizziness, tremors, seizures, syncope, facial asymmetry, speech difficulty, weakness, light-headedness, numbness, headaches and paresthesias.  Hematological: Negative for  adenopathy. Does not bruise/bleed easily.  Psychiatric/Behavioral: Negative.  Negative for suicidal ideas, hallucinations, behavioral problems, confusion, sleep disturbance, self-injury, dysphoric mood, decreased concentration and agitation. The patient is not nervous/anxious and is not hyperactive.        Objective:   Physical Exam  Vitals reviewed. Constitutional: She is oriented to person, place, and time. She appears well-developed and well-nourished. No distress.  HENT:  Head: Normocephalic and atraumatic. No trismus in the jaw.  Right Ear: Hearing, tympanic membrane, external ear and ear canal normal.  Left Ear: Hearing, tympanic membrane, external ear and ear canal normal.  Nose: Mucosal edema and rhinorrhea present. No nose lacerations, sinus tenderness, nasal deformity, septal deviation or nasal septal hematoma. No epistaxis.  No foreign bodies. Right sinus exhibits no maxillary sinus tenderness and no frontal sinus tenderness. Left sinus exhibits no maxillary sinus tenderness and no frontal sinus tenderness.  Mouth/Throat: Oropharynx is clear and moist and mucous membranes are normal. Mucous membranes are not pale, not dry and not cyanotic. No uvula swelling. No oropharyngeal exudate, posterior oropharyngeal edema, posterior oropharyngeal erythema or tonsillar abscesses.  Eyes: Conjunctivae are normal. Right eye exhibits no discharge. Left eye exhibits no discharge. No scleral icterus.  Neck: Normal range of motion. Neck supple. No JVD present. No tracheal deviation present. No thyromegaly present.  Cardiovascular: Normal rate, regular rhythm, normal heart sounds and intact distal pulses.  Exam reveals no gallop and no friction rub.   No murmur heard. Pulmonary/Chest: Effort normal and breath sounds normal. No stridor. No respiratory distress. She has no wheezes. She has no rales. She exhibits no tenderness.  Abdominal: Soft. Bowel sounds are normal. She exhibits no distension and no  mass. There is no tenderness. There is no rebound and no guarding.  Musculoskeletal: Normal range of motion. She exhibits no edema and no tenderness.  Lymphadenopathy:    She has no cervical adenopathy.  Neurological: She is oriented to person, place, and time.  Skin: Skin is  warm and dry. No rash noted. She is not diaphoretic. No erythema. No pallor.  Psychiatric: She has a normal mood and affect. Her behavior is normal. Judgment and thought content normal.          Assessment & Plan:

## 2011-09-27 NOTE — Assessment & Plan Note (Signed)
Restart flonase ns and try [id number]

## 2011-10-01 ENCOUNTER — Encounter: Payer: Self-pay | Admitting: Internal Medicine

## 2011-10-01 LAB — VITAMIN D 1,25 DIHYDROXY: Vitamin D3 1, 25 (OH)2: 77 pg/mL

## 2011-10-03 ENCOUNTER — Telehealth: Payer: Self-pay

## 2011-10-03 NOTE — Telephone Encounter (Signed)
Patient informed. 

## 2011-10-03 NOTE — Telephone Encounter (Signed)
Yes, it can cause some swelling so this is an expected reaction

## 2011-10-03 NOTE — Telephone Encounter (Signed)
Patient was seen on 09/27/2011 for New pt. OV. She stated she received the tdap vaccine. There has been swelling, bruising, soreness and feels like a knot where the injection was given . Please advise if normal, [phone]

## 2013-02-09 ENCOUNTER — Other Ambulatory Visit: Payer: Self-pay | Admitting: Internal Medicine

## 2013-11-12 ENCOUNTER — Encounter: Payer: Self-pay | Admitting: Internal Medicine

## 2013-11-12 ENCOUNTER — Ambulatory Visit (INDEPENDENT_AMBULATORY_CARE_PROVIDER_SITE_OTHER): Payer: 59 | Admitting: Internal Medicine

## 2013-11-12 VITALS — BP 112/70 | HR 80 | Temp 97.1°F | Resp 16 | Ht 62.0 in | Wt 194.0 lb

## 2013-11-12 DIAGNOSIS — M549 Dorsalgia, unspecified: Secondary | ICD-10-CM

## 2013-11-12 DIAGNOSIS — J309 Allergic rhinitis, unspecified: Secondary | ICD-10-CM

## 2013-11-12 MED ORDER — METHYLPREDNISOLONE ACETATE 80 MG/ML IJ SUSP
120.0000 mg | Freq: Once | INTRAMUSCULAR | Status: AC
  Administered 2013-11-12: 120 mg via INTRAMUSCULAR

## 2013-11-12 MED ORDER — CETIRIZINE-PSEUDOEPHEDRINE ER 5-120 MG PO TB12: 1.0000 | ORAL_TABLET | Freq: Two times a day (BID) | ORAL | Status: AC

## 2013-11-12 NOTE — Assessment & Plan Note (Signed)
She has had some relief with nsaids I think an injection of depo-medrol IM will help

## 2013-11-12 NOTE — Patient Instructions (Signed)
Back Pain, Adult Low back pain is very common. About 1 in 5 people have back pain.The cause of low back pain is rarely dangerous. The pain often gets better over time.About half of people with a sudden onset of back pain feel better in just 2 weeks. About 8 in 10 people feel better by 6 weeks.  CAUSES Some common causes of back pain include:  Strain of the muscles or ligaments supporting the spine.  Wear and tear (degeneration) of the spinal discs.  Arthritis.  Direct injury to the back. DIAGNOSIS Most of the time, the direct cause of low back pain is not known.However, back pain can be treated effectively even when the exact cause of the pain is unknown.Answering your caregiver's questions about your overall health and symptoms is one of the most accurate ways to make sure the cause of your pain is not dangerous. If your caregiver needs more information, he or she may order lab work or imaging tests (X-rays or MRIs).However, even if imaging tests show changes in your back, this usually does not require surgery. HOME CARE INSTRUCTIONS For many people, back pain returns.Since low back pain is rarely dangerous, it is often a condition that people can learn to manageon their own.   Remain active. It is stressful on the back to sit or stand in one place. Do not sit, drive, or stand in one place for more than 30 minutes at a time. Take short walks on level surfaces as soon as pain allows.Try to increase the length of time you walk each day.  Do not stay in bed.Resting more than 1 or 2 days can delay your recovery.  Do not avoid exercise or work.Your body is made to move.It is not dangerous to be active, even though your back may hurt.Your back will likely heal faster if you return to being active before your pain is gone.  Pay attention to your body when you bend and lift. Many people have less discomfortwhen lifting if they bend their knees, keep the load close to their bodies,and  avoid twisting. Often, the most comfortable positions are those that put less stress on your recovering back.  Find a comfortable position to sleep. Use a firm mattress and lie on your side with your knees slightly bent. If you lie on your back, put a pillow under your knees.  Only take over-the-counter or prescription medicines as directed by your caregiver. Over-the-counter medicines to reduce pain and inflammation are often the most helpful.Your caregiver may prescribe muscle relaxant drugs.These medicines help dull your pain so you can more quickly return to your normal activities and healthy exercise.  Put ice on the injured area.  Put ice in a plastic bag.  Place a towel between your skin and the bag.  Leave the ice on for 15-20 minutes, 03-04 times a day for the first 2 to 3 days. After that, ice and heat may be alternated to reduce pain and spasms.  Ask your caregiver about trying back exercises and gentle massage. This may be of some benefit.  Avoid feeling anxious or stressed.Stress increases muscle tension and can worsen back pain.It is important to recognize when you are anxious or stressed and learn ways to manage it.Exercise is a great option. SEEK MEDICAL CARE IF:  You have pain that is not relieved with rest or medicine.  You have pain that does not improve in 1 week.  You have new symptoms.  You are generally not feeling well. SEEK   IMMEDIATE MEDICAL CARE IF:   You have pain that radiates from your back into your legs.  You develop new bowel or bladder control problems.  You have unusual weakness or numbness in your arms or legs.  You develop nausea or vomiting.  You develop abdominal pain.  You feel faint. Document Released: 08/27/2005 Document Revised: 02/26/2012 Document Reviewed: 01/15/2011 [employer] Patient Information 2014 [employer].  

## 2013-11-12 NOTE — Progress Notes (Signed)
Pre visit review using our clinic review tool, if applicable. No additional management support is needed unless otherwise documented below in the visit note. 

## 2013-11-12 NOTE — Progress Notes (Signed)
Subjective:    Patient ID: Madison Rowe, female    DOB: Apr 16, 1951, 63 y.o.   MRN: [id number]  Sinusitis This is a new problem. The current episode started in the past 7 days. The problem has been gradually improving since onset. There has been no fever. The fever has been present for less than 1 day. Her pain is at a severity of 0/10. She is experiencing no pain. Associated symptoms include congestion and sneezing. Pertinent negatives include no chills, coughing, diaphoresis, ear pain, headaches, hoarse voice, neck pain, shortness of breath, sinus pressure, sore throat or swollen glands. Past treatments include spray decongestants. The treatment provided mild relief.      Review of Systems  Constitutional: Negative.  Negative for fever, chills, diaphoresis and fatigue.  HENT: Positive for congestion, postnasal drip, rhinorrhea and sneezing. Negative for dental problem, ear pain, facial swelling, hoarse voice, nosebleeds, sinus pressure, sore throat, tinnitus, trouble swallowing and voice change.   Eyes: Negative.   Respiratory: Negative.  Negative for cough, choking, chest tightness, shortness of breath, wheezing and stridor.   Cardiovascular: Negative.  Negative for chest pain, palpitations and leg swelling.  Gastrointestinal: Negative.  Negative for nausea, vomiting, abdominal pain, diarrhea, constipation and blood in stool.  Endocrine: Negative.   Genitourinary: Negative.  Negative for frequency, hematuria, flank pain and difficulty urinating.  Musculoskeletal: Positive for back pain. Negative for arthralgias, gait problem, joint swelling, myalgias, neck pain and neck stiffness.       She has right low back pain, aching without radiation.  Skin: Negative.   Allergic/Immunologic: Negative.   Neurological: Negative.  Negative for dizziness, weakness, numbness and headaches.  Hematological: Negative.  Negative for adenopathy. Does not bruise/bleed easily.  Psychiatric/Behavioral:  Negative.        Objective:   Physical Exam  Vitals reviewed. Constitutional: She is oriented to person, place, and time. She appears well-developed and well-nourished.  Non-toxic appearance. She does not have a sickly appearance. She does not appear ill. No distress.  HENT:  Head: Normocephalic.  Right Ear: Hearing, tympanic membrane, external ear and ear canal normal.  Left Ear: Hearing, external ear and ear canal normal.  Nose: Rhinorrhea present. No sinus tenderness or nasal deformity. Right sinus exhibits no maxillary sinus tenderness and no frontal sinus tenderness. Left sinus exhibits no maxillary sinus tenderness and no frontal sinus tenderness.  Mouth/Throat: Oropharynx is clear and moist and mucous membranes are normal. Mucous membranes are not pale, not dry and not cyanotic. No oral lesions. No trismus in the jaw. No uvula swelling. No oropharyngeal exudate, posterior oropharyngeal edema, posterior oropharyngeal erythema or tonsillar abscesses.  Eyes: Conjunctivae are normal. Right eye exhibits no discharge. Left eye exhibits no discharge. No scleral icterus.  Neck: Normal range of motion. Neck supple. No JVD present. No tracheal deviation present. No thyromegaly present.  Cardiovascular: Normal rate, regular rhythm, normal heart sounds and intact distal pulses.  Exam reveals no gallop.   No murmur heard. Pulmonary/Chest: Effort normal and breath sounds normal. No stridor. No respiratory distress. She has no wheezes. She has no rales. She exhibits no tenderness.  Abdominal: Soft. Bowel sounds are normal. She exhibits no distension and no mass. There is no tenderness. There is no rebound and no guarding.  Musculoskeletal: Normal range of motion. She exhibits no edema and no tenderness.       Lumbar back: Normal. She exhibits normal range of motion, no tenderness, no bony tenderness, no swelling, no edema, no deformity, no laceration,  no pain, no spasm and normal pulse.    Lymphadenopathy:    She has no cervical adenopathy.  Neurological: She is alert and oriented to person, place, and time. She has normal strength. She displays no atrophy, no tremor and normal reflexes. No cranial nerve deficit or sensory deficit. She exhibits normal muscle tone. She displays a negative Romberg sign. She displays no seizure activity. Coordination and gait normal.  Reflex Scores:      Tricep reflexes are 1+ on the right side and 1+ on the left side.      Bicep reflexes are 1+ on the right side and 1+ on the left side.      Brachioradialis reflexes are 1+ on the right side and 1+ on the left side.      Patellar reflexes are 1+ on the right side and 1+ on the left side.      Achilles reflexes are 1+ on the right side and 1+ on the left side. Neg SLR in BLE  Skin: Skin is warm and dry. No rash noted. She is not diaphoretic. No erythema. No pallor.     Lab Results  Component Value Date   WBC 4.9 09/27/2011   HGB 11.9* 09/27/2011   HCT 34.8* 09/27/2011   PLT 229.0 09/27/2011   GLUCOSE 78 09/27/2011   CHOL 201* 09/27/2011   TRIG 44.0 09/27/2011   HDL 65.60 09/27/2011   LDLDIRECT 122.7 09/27/2011   ALT 15 09/27/2011   AST 18 09/27/2011   NA 138 09/27/2011   K 4.5 09/27/2011   CL 103 09/27/2011   CREATININE 0.6 09/27/2011   BUN 18 09/27/2011   CO2 27 09/27/2011   TSH 2.27 09/27/2011       Assessment & Plan:

## 2013-11-12 NOTE — Assessment & Plan Note (Signed)
She is having a flare of s/s so I gave her an injection of depo-medrol IM I have also asked her to start zyrtec-d

## 2014-04-21 LAB — HM PAP SMEAR: HM PAP: NORMAL

## 2014-05-22 ENCOUNTER — Other Ambulatory Visit: Payer: Self-pay | Admitting: Internal Medicine

## 2014-08-02 ENCOUNTER — Ambulatory Visit (INDEPENDENT_AMBULATORY_CARE_PROVIDER_SITE_OTHER): Payer: 59

## 2014-08-02 ENCOUNTER — Ambulatory Visit (INDEPENDENT_AMBULATORY_CARE_PROVIDER_SITE_OTHER): Payer: 59 | Admitting: Podiatry

## 2014-08-02 ENCOUNTER — Encounter: Payer: Self-pay | Admitting: Podiatry

## 2014-08-02 DIAGNOSIS — M2010 Hallux valgus (acquired), unspecified foot: Secondary | ICD-10-CM

## 2014-08-02 DIAGNOSIS — M779 Enthesopathy, unspecified: Secondary | ICD-10-CM

## 2014-08-02 DIAGNOSIS — M722 Plantar fascial fibromatosis: Secondary | ICD-10-CM

## 2014-08-02 MED ORDER — TRIAMCINOLONE ACETONIDE 10 MG/ML IJ SUSP: 10.0000 mg | Freq: Once | INTRAMUSCULAR | Status: DC

## 2014-08-02 NOTE — Patient Instructions (Addendum)
Bunion (Hallux Valgus) A bony bump (protrusion) on the inside of the foot, at the base of the first toe, is called a bunion (hallux valgus). A bunion causes the first toe to angle toward the other toes. SYMPTOMS   A bony bump on the inside of the foot, causing an outward turning of the first toe. It may also overlap the second toe.  Thickening of the skin (callus) over the bony bump.  Fluid buildup under the callus. Fluid may become red, tender, and swollen (inflamed) with constant irritation or pressure.  Foot pain and stiffness. CAUSES  Many causes exist, including:  Inherited from your family (genetics).  Injury (trauma) forcing the first toe into a position in which it overlaps other toes.  Bunions are also associated with wearing shoes that have a narrow toe box (pointy shoes). RISK INCREASES WITH:  Family history of foot abnormalities, especially bunions.  Arthritis.  Narrow shoes, especially high heels. PREVENTION  Wear shoes with a wide toe box.  Avoid shoes with high heels.  Wear a small pad between the big toe and second toe.  Maintain proper conditioning:  Foot and ankle flexibility.  Muscle strength and endurance. PROGNOSIS  With proper treatment, bunions can typically be cured. Occasionally, surgery is required.  RELATED COMPLICATIONS   Infection of the bunion.  Arthritis of the first toe.  Risks of surgery, including infection, bleeding, injury to nerves (numb toe), recurrent bunion, overcorrection (toe points inward), arthritis of the big toe, big toe pointing upward, and bone not healing. TREATMENT  Treatment first consists of stopping the activities that aggravate the pain, taking pain medicines, and icing to reduce inflammation and pain. Wear shoes with a wide toe box. Shoes can be modified by a shoe repair person to relieve pressure on the bunion, especially if you cannot find shoes with a wide enough toe box. You may also place a pad with the  center cut out in your shoe, to reduce pressure on the bunion. Sometimes, an arch support (orthotic) may reduce pressure on the bunion and alleviate the symptoms. Stretching and strengthening exercises for the muscles of the foot may be useful. You may choose to wear a brace or pad at night to hold the big toe away from the second toe. If non-surgical treatments are not successful, surgery may be needed. Surgery involves removing the overgrown tissue and correcting the position of the first toe, by realigning the bones. Bunion surgery is typically performed on an outpatient basis, meaning you can go home the same day as surgery. The surgery may involve cutting the mid portion of the bone of the first toe, or just cutting and repairing (reconstructing) the ligaments and soft tissues around the first toe.  MEDICATION   If pain medicine is needed, nonsteroidal anti-inflammatory medicines, such as aspirin and ibuprofen, or other minor pain relievers, such as acetaminophen, are often recommended.  Do not take pain medicine for 7 days before surgery.  Prescription pain relievers are usually only prescribed after surgery. Use only as directed and only as much as you need.  Ointments applied to the skin may be helpful. HEAT AND COLD  Cold treatment (icing) relieves pain and reduces inflammation. Cold treatment should be applied for 10 to 15 minutes every 2 to 3 hours for inflammation and pain and immediately after any activity that aggravates your symptoms. Use ice packs or an ice massage.  Heat treatment may be used prior to performing the stretching and strengthening activities prescribed by your   caregiver, physical therapist, or athletic trainer. Use a heat pack or a warm soak. SEEK MEDICAL CARE IF:   Symptoms get worse or do not improve in 2 weeks, despite treatment.  After surgery, you develop fever, increasing pain, redness, swelling, drainage of fluids, bleeding, or increasing warmth around the  surgical area.  New, unexplained symptoms develop. (Drugs used in treatment may produce side effects.) Document Released: 08/27/2005 Document Revised: 11/19/2011 Document Reviewed: 12/09/2008 [facility] Patient Information 2015 [place], [place]. This information is not intended to replace advice given to you by your health care provider. Make sure you discuss any questions you have with your health care provider.   Plantar Fasciitis (Heel Spur Syndrome) with Rehab The plantar fascia is a fibrous, ligament-like, soft-tissue structure that spans the bottom of the foot. Plantar fasciitis is a condition that causes pain in the foot due to inflammation of the tissue. SYMPTOMS   Pain and tenderness on the underneath side of the foot.  Pain that worsens with standing or walking. CAUSES  Plantar fasciitis is caused by irritation and injury to the plantar fascia on the underneath side of the foot. Common mechanisms of injury include:  Direct trauma to bottom of the foot.  Damage to a small nerve that runs under the foot where the main fascia attaches to the heel bone.  Stress placed on the plantar fascia due to bone spurs. RISK INCREASES WITH:   Activities that place stress on the plantar fascia (running, jumping, pivoting, or cutting).  Poor strength and flexibility.  Improperly fitted shoes.  Tight calf muscles.  Flat feet.  Failure to warm-up properly before activity.  Obesity. PREVENTION  Warm up and stretch properly before activity.  Allow for adequate recovery between workouts.  Maintain physical fitness:  Strength, flexibility, and endurance.  Cardiovascular fitness.  Maintain a health body weight.  Avoid stress on the plantar fascia.  Wear properly fitted shoes, including arch supports for individuals who have flat feet.  PROGNOSIS  If treated properly, then the symptoms of plantar fasciitis usually resolve without surgery. However, occasionally surgery is  necessary.  RELATED COMPLICATIONS   Recurrent symptoms that may result in a chronic condition.  Problems of the lower back that are caused by compensating for the injury, such as limping.  Pain or weakness of the foot during push-off following surgery.  Chronic inflammation, scarring, and partial or complete fascia tear, occurring more often from repeated injections.  TREATMENT  Treatment initially involves the use of ice and medication to help reduce pain and inflammation. The use of strengthening and stretching exercises may help reduce pain with activity, especially stretches of the Achilles tendon. These exercises may be performed at home or with a therapist. Your caregiver may recommend that you use heel cups of arch supports to help reduce stress on the plantar fascia. Occasionally, corticosteroid injections are given to reduce inflammation. If symptoms persist for greater than 6 months despite non-surgical (conservative), then surgery may be recommended.   MEDICATION   If pain medication is necessary, then nonsteroidal anti-inflammatory medications, such as aspirin and ibuprofen, or other minor pain relievers, such as acetaminophen, are often recommended.  Do not take pain medication within 7 days before surgery.  Prescription pain relievers may be given if deemed necessary by your caregiver. Use only as directed and only as much as you need.  Corticosteroid injections may be given by your caregiver. These injections should be reserved for the most serious cases, because they may only be given a  certain number of times.  HEAT AND COLD  Cold treatment (icing) relieves pain and reduces inflammation. Cold treatment should be applied for 10 to 15 minutes every 2 to 3 hours for inflammation and pain and immediately after any activity that aggravates your symptoms. Use ice packs or massage the area with a piece of ice (ice massage).  Heat treatment may be used prior to performing the  stretching and strengthening activities prescribed by your caregiver, physical therapist, or athletic trainer. Use a heat pack or soak the injury in warm water.  SEEK IMMEDIATE MEDICAL CARE IF:  Treatment seems to offer no benefit, or the condition worsens.  Any medications produce adverse side effects.  EXERCISES- RANGE OF MOTION (ROM) AND STRETCHING EXERCISES - Plantar Fasciitis (Heel Spur Syndrome) These exercises may help you when beginning to rehabilitate your injury. Your symptoms may resolve with or without further involvement from your physician, physical therapist or athletic trainer. While completing these exercises, remember:   Restoring tissue flexibility helps normal motion to return to the joints. This allows healthier, less painful movement and activity.  An effective stretch should be held for at least 30 seconds.  A stretch should never be painful. You should only feel a gentle lengthening or release in the stretched tissue.  RANGE OF MOTION - Toe Extension, Flexion  Sit with your right / left leg crossed over your opposite knee.  Grasp your toes and gently pull them back toward the top of your foot. You should feel a stretch on the bottom of your toes and/or foot.  Hold this stretch for 10 seconds.  Now, gently pull your toes toward the bottom of your foot. You should feel a stretch on the top of your toes and or foot.  Hold this stretch for 10 seconds. Repeat  times. Complete this stretch 3 times per day.   RANGE OF MOTION - Ankle Dorsiflexion, Active Assisted  Remove shoes and sit on a chair that is preferably not on a carpeted surface.  Place right / left foot under knee. Extend your opposite leg for support.  Keeping your heel down, slide your right / left foot back toward the chair until you feel a stretch at your ankle or calf. If you do not feel a stretch, slide your bottom forward to the edge of the chair, while still keeping your heel down.  Hold this  stretch for 10 seconds. Repeat 3 times. Complete this stretch 2 times per day.   STRETCH  Gastroc, Standing  Place hands on wall.  Extend right / left leg, keeping the front knee somewhat bent.  Slightly point your toes inward on your back foot.  Keeping your right / left heel on the floor and your knee straight, shift your weight toward the wall, not allowing your back to arch.  You should feel a gentle stretch in the right / left calf. Hold this position for 10 seconds. Repeat 3 times. Complete this stretch 2 times per day.  STRETCH  Soleus, Standing  Place hands on wall.  Extend right / left leg, keeping the other knee somewhat bent.  Slightly point your toes inward on your back foot.  Keep your right / left heel on the floor, bend your back knee, and slightly shift your weight over the back leg so that you feel a gentle stretch deep in your back calf.  Hold this position for 10 seconds. Repeat 3 times. Complete this stretch 2 times per day.  STRETCH  Mackey,  Standing  Note: This exercise can place a lot of stress on your foot and ankle. Please complete this exercise only if specifically instructed by your caregiver.   Place the ball of your right / left foot on a step, keeping your other foot firmly on the same step.  Hold on to the wall or a rail for balance.  Slowly lift your other foot, allowing your body weight to press your heel down over the edge of the step.  You should feel a stretch in your right / left calf.  Hold this position for 10 seconds.  Repeat this exercise with a slight bend in your right / left knee. Repeat 3 times. Complete this stretch 2 times per day.   STRENGTHENING EXERCISES - Plantar Fasciitis (Heel Spur Syndrome)  These exercises may help you when beginning to rehabilitate your injury. They may resolve your symptoms with or without further involvement from your physician, physical therapist or athletic trainer. While completing these  exercises, remember:   Muscles can gain both the endurance and the strength needed for everyday activities through controlled exercises.  Complete these exercises as instructed by your physician, physical therapist or athletic trainer. Progress the resistance and repetitions only as guided.  STRENGTH - Towel Curls  Sit in a chair positioned on a non-carpeted surface.  Place your foot on a towel, keeping your heel on the floor.  Pull the towel toward your heel by only curling your toes. Keep your heel on the floor. Repeat 3 times. Complete this exercise 2 times per day.  STRENGTH - Ankle Inversion  Secure one end of a rubber exercise band/tubing to a fixed object (table, pole). Loop the other end around your foot just before your toes.  Place your fists between your knees. This will focus your strengthening at your ankle.  Slowly, pull your big toe up and in, making sure the band/tubing is positioned to resist the entire motion.  Hold this position for 10 seconds.  Have your muscles resist the band/tubing as it slowly pulls your foot back to the starting position. Repeat 3 times. Complete this exercises 2 times per day.  Document Released: 08/27/2005 Document Revised: 11/19/2011 Document Reviewed: 12/09/2008 [facility] Patient Information 2014 [place], [place].

## 2014-08-03 NOTE — Progress Notes (Signed)
Subjective:     Patient ID: Madison Rowe, female   DOB: 1950-12-16, 63 y.o.   MRN: [id number]  HPI patient presents stating my heel has been bothering me quite a bit the top of my left foot has been hurting and I have these bunions which seem to be getting worse and are sore with certain types of shoes on both feet. Has tried over-the-counter support ice therapy without relief   Review of Systems  All other systems reviewed and are negative.      Objective:   Physical Exam  Constitutional: She is oriented to person, place, and time.  Cardiovascular: Intact distal pulses.   Musculoskeletal: Normal range of motion.  Neurological: She is oriented to person, place, and time.  Skin: Skin is warm.  Nursing note and vitals reviewed.  neurovascular status intact muscle strength was found to be adequate and range of motion subtalar midtarsal joint is within normal limits. 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

## 2014-08-16 ENCOUNTER — Encounter: Payer: Self-pay | Admitting: Podiatry

## 2014-08-16 ENCOUNTER — Ambulatory Visit (INDEPENDENT_AMBULATORY_CARE_PROVIDER_SITE_OTHER): Payer: 59 | Admitting: Podiatry

## 2014-08-16 VITALS — BP 116/64 | HR 76 | Resp 16

## 2014-08-16 DIAGNOSIS — M722 Plantar fascial fibromatosis: Secondary | ICD-10-CM

## 2014-08-16 MED ORDER — TRIAMCINOLONE ACETONIDE 10 MG/ML IJ SUSP
10.0000 mg | Freq: Once | INTRAMUSCULAR | Status: AC
  Administered 2014-08-16: 10 mg

## 2014-08-17 NOTE — Progress Notes (Signed)
Subjective:     Patient ID: Madison Rowe, female   DOB: [birth year], 63 y.o.   MRN: [id number]  HPI patient presents stating the inside of my heel feels pretty good but I'm getting a lot of pain on the plantar outside of my heel and I get a lot of pain when I get up in the morning or after periods of sitting   Review of Systems     Objective:   Physical Exam  neurovascular status unchanged with discomfort which has moved to the center and lateral portion of the plantar heel right with inflammation and pain noted    Assessment:      plantar fasciitis of the central and lateral band right with stretching that is necessary to help  solve this problem    Plan:      injected from a lateral direction the lateral and central band of the plantar fascia and  Dispensed night splint with instructions on usage at this time

## 2014-09-13 ENCOUNTER — Ambulatory Visit (INDEPENDENT_AMBULATORY_CARE_PROVIDER_SITE_OTHER): Payer: 59 | Admitting: Podiatry

## 2014-09-13 ENCOUNTER — Encounter: Payer: Self-pay | Admitting: Podiatry

## 2014-09-13 VITALS — BP 116/64 | HR 76 | Resp 16

## 2014-09-13 DIAGNOSIS — M722 Plantar fascial fibromatosis: Secondary | ICD-10-CM

## 2014-09-15 NOTE — Progress Notes (Signed)
Subjective:     Patient ID: Madison Rowe, female   DOB: [birth year], 64 y.o.   MRN: [id number]  HPI patient states her right heel is doing quite a bit better but still is discomfort again if she is on it too much or she is not wearing the right shoes   Review of Systems     Objective:   Physical Exam Neurovascular status unchanged with health history good and noted to have pain in the plantar right heel upon deep palpation to the central band with minimal discomfort on the medial or lateral side    Assessment:     Improved plantar fasciitis right with inflammation still noted but quite a bit better from previous visit    Plan:     Reviewed condition and at this time recommended aggressive physical therapy supportive shoe gear usage and anti-inflammatories as needed. Reappoint to recheck

## 2014-11-03 ENCOUNTER — Other Ambulatory Visit: Payer: Self-pay | Admitting: *Deleted

## 2014-11-03 NOTE — Telephone Encounter (Signed)
[facility] sent a refill request for Gabapentin 4%/Ketoprofen 10%/Lidocaine 5%/ and Cyclobenazaprine H 2% compound cream. Apply 1-12ml to affected area 3-4 times a day.   Dr. Josephina Shih the refill plus 2 additional refills.

## 2015-01-20 LAB — HM MAMMOGRAPHY

## 2015-03-30 LAB — HM MAMMOGRAPHY: HM MAMMO: NORMAL

## 2015-05-04 LAB — LIPID PANEL
CHOLESTEROL: 207 mg/dL — AB (ref 0–200)
HDL: 80 mg/dL — AB (ref 35–70)
LDL CALC: 116 mg/dL
TRIGLYCERIDES: 55 mg/dL (ref 40–160)

## 2015-05-04 LAB — BASIC METABOLIC PANEL
BUN: 13 mg/dL (ref 4–21)
Creatinine: 0.7 mg/dL (ref 0.5–1.1)
Glucose: 91 mg/dL
Potassium: 4.2 mmol/L (ref 3.4–5.3)
SODIUM: 139 mmol/L (ref 137–147)

## 2015-05-04 LAB — HEPATIC FUNCTION PANEL
ALK PHOS: 59 U/L (ref 25–125)
ALT: 13 U/L (ref 7–35)
AST: 16 U/L (ref 13–35)

## 2015-05-04 LAB — CBC AND DIFFERENTIAL
HEMATOCRIT: 35 % — AB (ref 36–46)
HEMOGLOBIN: 11.4 g/dL — AB (ref 12.0–16.0)
Platelets: 235 10*3/uL (ref 150–399)
WBC: 4.8 10^3/mL

## 2015-05-04 LAB — TSH: TSH: 5.23 u[IU]/mL (ref 0.41–5.90)

## 2015-05-26 ENCOUNTER — Other Ambulatory Visit (INDEPENDENT_AMBULATORY_CARE_PROVIDER_SITE_OTHER): Payer: 59

## 2015-05-26 ENCOUNTER — Ambulatory Visit (INDEPENDENT_AMBULATORY_CARE_PROVIDER_SITE_OTHER): Payer: 59 | Admitting: Internal Medicine

## 2015-05-26 ENCOUNTER — Encounter: Payer: Self-pay | Admitting: Internal Medicine

## 2015-05-26 VITALS — BP 112/80 | HR 79 | Temp 98.1°F | Resp 16 | Ht 62.0 in | Wt 192.0 lb

## 2015-05-26 DIAGNOSIS — M810 Age-related osteoporosis without current pathological fracture: Secondary | ICD-10-CM

## 2015-05-26 DIAGNOSIS — E038 Other specified hypothyroidism: Secondary | ICD-10-CM | POA: Diagnosis not present

## 2015-05-26 DIAGNOSIS — E782 Mixed hyperlipidemia: Secondary | ICD-10-CM

## 2015-05-26 DIAGNOSIS — E559 Vitamin D deficiency, unspecified: Secondary | ICD-10-CM

## 2015-05-26 DIAGNOSIS — D538 Other specified nutritional anemias: Secondary | ICD-10-CM | POA: Diagnosis not present

## 2015-05-26 DIAGNOSIS — L301 Dyshidrosis [pompholyx]: Secondary | ICD-10-CM

## 2015-05-26 LAB — T3, FREE: T3, Free: 3 pg/mL (ref 2.3–4.2)

## 2015-05-26 LAB — T4: T4, Total: 7.5 ug/dL (ref 4.5–12.0)

## 2015-05-26 LAB — T4, FREE: Free T4: 0.91 ng/dL (ref 0.60–1.60)

## 2015-05-26 LAB — TSH: TSH: 2.68 u[IU]/mL (ref 0.35–4.50)

## 2015-05-26 MED ORDER — FLUOCINONIDE-E 0.05 % EX CREA: 1.0000 "application " | TOPICAL_CREAM | Freq: Two times a day (BID) | CUTANEOUS | Status: DC

## 2015-05-26 NOTE — Progress Notes (Signed)
Subjective:  Patient ID: Madison Rowe, female    DOB: [birth year]  Age: 64 y.o. MRN: [id number]  CC: Hypothyroidism and Rash   HPI Madison Rowe presents for follow on recent labs that were done by her GYN. She had a low Vit D level and a slightly elevated TSH level. She feels well and offers no complaints.  Outpatient Prescriptions Prior to Visit  Medication Sig Dispense Refill  . conjugated estrogens (PREMARIN) vaginal cream Place vaginally as directed. 2-3 times weekly.    . fluticasone (FLONASE) 50 MCG/ACT nasal spray instill 2 sprays into each nostril once daily 16 g 11  . Ibuprofen (ADVIL) 200 MG CAPS Take 400 mg by mouth 2 (two) times daily.    Marland Kitchen triamcinolone acetonide (KENALOG) 10 MG/ML injection 10 mg      No facility-administered medications prior to visit.    ROS Review of Systems  Constitutional: Negative.  Negative for fever, chills, diaphoresis, appetite change and fatigue.  HENT: Negative.   Eyes: Negative.   Respiratory: Negative.  Negative for cough, choking, chest tightness, shortness of breath and stridor.   Cardiovascular: Negative.  Negative for chest pain, palpitations and leg swelling.  Gastrointestinal: Negative.  Negative for nausea, vomiting, abdominal pain, diarrhea, constipation and blood in stool.  Endocrine: Negative.   Genitourinary: Negative.   Musculoskeletal: Negative.  Negative for myalgias, back pain, joint swelling and arthralgias.  Skin: Negative.  Negative for rash.  Allergic/Immunologic: Negative.   Neurological: Negative.  Negative for dizziness, syncope, speech difficulty, light-headedness and headaches.  Hematological: Negative.  Negative for adenopathy. Does not bruise/bleed easily.  Psychiatric/Behavioral: Negative.     Objective:  BP 112/80 mmHg  Pulse 79  Temp(Src) 98.1 F (36.7 C) (Oral)  Resp 16  Ht 5\' 2"  (1.575 m)  Wt 192 lb (87.091 kg)  BMI 35.11 kg/m2  SpO2 96%  BP Readings from Last 3 Encounters:  05/26/15  112/80  09/13/14 116/64  08/16/14 116/64    Wt Readings from Last 3 Encounters:  05/26/15 192 lb (87.091 kg)  11/12/13 194 lb (87.998 kg)  09/27/11 180 lb 8 oz (81.874 kg)    Physical Exam  Constitutional: She is oriented to person, place, and time. No distress.  HENT:  Mouth/Throat: Oropharynx is clear and moist. No oropharyngeal exudate.  Eyes: Conjunctivae are normal. Right eye exhibits no discharge. Left eye exhibits no discharge. No scleral icterus.  Neck: Normal range of motion. Neck supple. No JVD present. No tracheal deviation present. No thyromegaly present.  Cardiovascular: Normal rate, regular rhythm, normal heart sounds and intact distal pulses.  Exam reveals no gallop and no friction rub.   No murmur heard. Pulmonary/Chest: Effort normal and breath sounds normal. No stridor. No respiratory distress. She has no wheezes. She has no rales. She exhibits no tenderness.  Abdominal: Soft. Bowel sounds are normal. She exhibits no distension and no mass. There is no tenderness. There is no rebound and no guarding.  Musculoskeletal: Normal range of motion. She exhibits no edema or tenderness.  Lymphadenopathy:    She has no cervical adenopathy.  Neurological: She is oriented to person, place, and time.  Skin: Skin is warm and dry. Rash noted. No purpura noted. Rash is not macular, not maculopapular, not pustular, not vesicular and not urticarial. She is not diaphoretic. No erythema. No pallor.  She complains of intermittent itchy dry spots on her legs, she previously saw a derm and was told that she has eczema, she wants a cream to  treat this There are small patches of ezcema on her LE's  Vitals reviewed.   Lab Results  Component Value Date   WBC 4.8 05/04/2015   HGB 11.4* 05/04/2015   HCT 35* 05/04/2015   PLT 235 05/04/2015   GLUCOSE 78 09/27/2011   CHOL 207* 05/04/2015   TRIG 55 05/04/2015   HDL 80* 05/04/2015   LDLDIRECT 122.7 09/27/2011   LDLCALC 116 05/04/2015   ALT  13 05/04/2015   AST 16 05/04/2015   NA 139 05/04/2015   K 4.2 05/04/2015   CL 103 09/27/2011   CREATININE 0.7 05/04/2015   BUN 13 05/04/2015   CO2 27 09/27/2011   TSH 2.68 05/26/2015    No results found.  Assessment & Plan:   Madison Rowe was seen today for hypothyroidism and rash.  Diagnoses and all orders for this visit:  Other specified nutritional anemias- this has improved, will follow for now -     Cancel: CBC with Differential/Platelet; Future -     Cancel: IBC panel; Future -     Cancel: Vitamin B12; Future -     Cancel: Folate; Future -     Cancel: Ferritin; Future  Mixed hyperlipidemia- Framingham risk score is only 2%. I do not recommend that she start a statin -     Cancel: Lipid panel; Future -     Cancel: Comprehensive metabolic panel; Future -     TSH; Future  Osteoporosis- she is due for a DEXA scan -     Cancel: Comprehensive metabolic panel; Future -     DG Bone Density; Future  Other specified hypothyroidism- recheck of her TFT's today are normal, will follow for now, no treatment is indicated -     Thyroid peroxidase antibody; Future -     T4, free; Future -     T4; Future -     T3, free; Future  Eczema, dyshidrotic -     fluocinonide-emollient (LIDEX-E) 0.05 % cream; Apply 1 application topically 2 (two) times daily.  Vitamin D deficiency- she has started D replacement therapy -     DG Bone Density; Future   I have discontinued Madison Rowe's Ibuprofen and clobetasol cream. I am also having her start on fluocinonide-emollient. Additionally, I am having her maintain her conjugated estrogens, fluticasone, BIOTIN PO, Cholecalciferol (VITAMIN D-3 PO), and vitamin B-12. We will stop administering triamcinolone acetonide.  Meds ordered this encounter  Medications  . BIOTIN PO    Sig: Take by mouth daily.  . Cholecalciferol (VITAMIN D-3 PO)    Sig: Take by mouth daily.  . vitamin B-12 (CYANOCOBALAMIN) 100 MCG tablet    Sig: Take 100 mcg by mouth daily.    Marland Kitchen DISCONTD: clobetasol cream (TEMOVATE) 0.05 %    Sig: apply TOPICALLY to affected area twice a day for 7 days    Refill:  0  . fluocinonide-emollient (LIDEX-E) 0.05 % cream    Sig: Apply 1 application topically 2 (two) times daily.    Dispense:  30 g    Refill:  3     Follow-up: Return in about 4 months (around 09/25/2015).  Scarlette Calico, MD

## 2015-05-26 NOTE — Progress Notes (Signed)
Pre visit review using our clinic review tool, if applicable. No additional management support is needed unless otherwise documented below in the visit note. 

## 2015-05-26 NOTE — Patient Instructions (Signed)
Hypothyroidism The thyroid is a large gland located in the lower front of your neck. The thyroid gland helps control metabolism. Metabolism is how your body handles food. It controls metabolism with the hormone thyroxine. When this gland is underactive (hypothyroid), it produces too little hormone.  CAUSES These include:   Absence or destruction of thyroid tissue.  Goiter due to iodine deficiency.  Goiter due to medications.  Congenital defects (since birth).  Problems with the pituitary. This causes a lack of TSH (thyroid stimulating hormone). This hormone tells the thyroid to turn out more hormone. SYMPTOMS  Lethargy (feeling as though you have no energy)  Cold intolerance  Weight gain (in spite of normal food intake)  Dry skin  Coarse hair  Menstrual irregularity (if severe, may lead to infertility)  Slowing of thought processes Cardiac problems are also caused by insufficient amounts of thyroid hormone. Hypothyroidism in the newborn is cretinism, and is an extreme form. It is important that this form be treated adequately and immediately or it will lead rapidly to retarded physical and mental development. DIAGNOSIS  To prove hypothyroidism, your caregiver may do blood tests and ultrasound tests. Sometimes the signs are hidden. It may be necessary for your caregiver to watch this illness with blood tests either before or after diagnosis and treatment. TREATMENT  Low levels of thyroid hormone are increased by using synthetic thyroid hormone. This is a safe, effective treatment. It usually takes about four weeks to gain the full effects of the medication. After you have the full effect of the medication, it will generally take another four weeks for problems to leave. Your caregiver may start you on low doses. If you have had heart problems the dose may be gradually increased. It is generally not an emergency to get rapidly to normal. HOME CARE INSTRUCTIONS   Take your  medications as your caregiver suggests. Let your caregiver know of any medications you are taking or start taking. Your caregiver will help you with dosage schedules.  As your condition improves, your dosage needs may increase. It will be necessary to have continuing blood tests as suggested by your caregiver.  Report all suspected medication side effects to your caregiver. SEEK MEDICAL CARE IF: Seek medical care if you develop:  Sweating.  Tremulousness (tremors).  Anxiety.  Rapid weight loss.  Heat intolerance.  Emotional swings.  Diarrhea.  Weakness. SEEK IMMEDIATE MEDICAL CARE IF:  You develop chest pain, an irregular heart beat (palpitations), or a rapid heart beat. MAKE SURE YOU:   Understand these instructions.  Will watch your condition.  Will get help right away if you are not doing well or get worse. Document Released: 08/27/2005 Document Revised: 11/19/2011 Document Reviewed: 04/16/2008 Exit[employer] Patient Information 2015 [employer]. This information is not intended to replace advice given to you by your health care provider. Make sure you discuss any questions you have with your health care provider.  

## 2015-05-27 ENCOUNTER — Encounter: Payer: Self-pay | Admitting: Internal Medicine

## 2015-05-27 LAB — TSH: TSH: 3.52

## 2015-05-27 LAB — THYROID PEROXIDASE ANTIBODY: Thyroperoxidase Ab SerPl-aCnc: 1 IU/mL (ref <9)

## 2015-05-27 LAB — VITAMIN D 25 HYDROXY (VIT D DEFICIENCY, FRACTURES): VIT D 25 HYDROXY: 27.8

## 2015-06-07 ENCOUNTER — Other Ambulatory Visit: Payer: Self-pay | Admitting: Internal Medicine

## 2015-10-17 ENCOUNTER — Ambulatory Visit (INDEPENDENT_AMBULATORY_CARE_PROVIDER_SITE_OTHER)
Admission: RE | Admit: 2015-10-17 | Discharge: 2015-10-17 | Disposition: A | Discharge status: Home / Self Care | Payer: Medicare Other | Source: Ambulatory Visit | Attending: Internal Medicine | Admitting: Internal Medicine

## 2015-10-17 DIAGNOSIS — M810 Age-related osteoporosis without current pathological fracture: Secondary | ICD-10-CM | POA: Diagnosis not present

## 2015-10-17 DIAGNOSIS — E559 Vitamin D deficiency, unspecified: Secondary | ICD-10-CM | POA: Diagnosis not present

## 2015-10-18 ENCOUNTER — Other Ambulatory Visit: Payer: 59

## 2015-10-18 DIAGNOSIS — L821 Other seborrheic keratosis: Secondary | ICD-10-CM | POA: Diagnosis not present

## 2015-10-18 DIAGNOSIS — B078 Other viral warts: Secondary | ICD-10-CM | POA: Diagnosis not present

## 2015-10-18 DIAGNOSIS — L851 Acquired keratosis [keratoderma] palmaris et plantaris: Secondary | ICD-10-CM | POA: Diagnosis not present

## 2015-10-24 ENCOUNTER — Other Ambulatory Visit: Payer: Self-pay | Admitting: Internal Medicine

## 2015-10-24 ENCOUNTER — Encounter: Payer: Self-pay | Admitting: Internal Medicine

## 2015-10-24 DIAGNOSIS — M810 Age-related osteoporosis without current pathological fracture: Secondary | ICD-10-CM

## 2015-10-24 LAB — HM DEXA SCAN

## 2015-10-24 MED ORDER — FOSTEUM PLUS PO CAPS: 1.0000 | ORAL_CAPSULE | Freq: Two times a day (BID) | ORAL | Status: DC

## 2015-10-27 ENCOUNTER — Telehealth: Payer: Self-pay

## 2015-10-27 DIAGNOSIS — M810 Age-related osteoporosis without current pathological fracture: Secondary | ICD-10-CM

## 2015-10-27 NOTE — Telephone Encounter (Signed)
Fax received from pharmacy stating Madison Rowe is not covered by pt's insurance plan. Please advise on alternative medication, thanks

## 2015-10-30 MED ORDER — FOSTEUM PLUS PO CAPS: 1.0000 | ORAL_CAPSULE | Freq: Two times a day (BID) | ORAL | Status: DC

## 2015-10-30 NOTE — Telephone Encounter (Signed)
I sent the prescription to a specialized pharmacy to try to get it at a lower cost

## 2015-10-31 NOTE — Telephone Encounter (Signed)
Left message on machine for pt to return my call  

## 2015-11-01 NOTE — Telephone Encounter (Signed)
Left message on machine for pt to return my call  

## 2015-11-11 DIAGNOSIS — J069 Acute upper respiratory infection, unspecified: Secondary | ICD-10-CM | POA: Diagnosis not present

## 2015-11-11 DIAGNOSIS — R110 Nausea: Secondary | ICD-10-CM | POA: Diagnosis not present

## 2015-11-11 DIAGNOSIS — R531 Weakness: Secondary | ICD-10-CM | POA: Diagnosis not present

## 2015-11-14 ENCOUNTER — Telehealth: Payer: Self-pay | Admitting: Internal Medicine

## 2015-11-14 NOTE — Telephone Encounter (Signed)
Pt request bone density result that was done 10/23/15. Please call her back

## 2015-11-14 NOTE — Telephone Encounter (Signed)
Pt request to speak to you, she was out of town that why she just now get a chance to return your call.

## 2015-11-15 NOTE — Telephone Encounter (Signed)
Spoke with pt she states her cost is 190 for the pills she would like to know if this is almost like Fosamax she had side effects w tha,t before she pays out of pocket w this. Please advise

## 2015-11-15 NOTE — Telephone Encounter (Signed)
No- this is not a drug like fosamax

## 2015-11-15 NOTE — Telephone Encounter (Signed)
Pt informed

## 2015-11-16 NOTE — Telephone Encounter (Signed)
Per pt, she discuss her questions with Dr. Ronnald Ramp' assistant

## 2016-01-11 DIAGNOSIS — H5203 Hypermetropia, bilateral: Secondary | ICD-10-CM | POA: Diagnosis not present

## 2016-01-11 DIAGNOSIS — H52223 Regular astigmatism, bilateral: Secondary | ICD-10-CM | POA: Diagnosis not present

## 2016-01-11 DIAGNOSIS — H40053 Ocular hypertension, bilateral: Secondary | ICD-10-CM | POA: Diagnosis not present

## 2016-01-11 DIAGNOSIS — H524 Presbyopia: Secondary | ICD-10-CM | POA: Diagnosis not present

## 2016-01-11 DIAGNOSIS — H1850 Unspecified hereditary corneal dystrophies: Secondary | ICD-10-CM | POA: Diagnosis not present

## 2016-02-08 DIAGNOSIS — Z1231 Encounter for screening mammogram for malignant neoplasm of breast: Secondary | ICD-10-CM | POA: Diagnosis not present

## 2016-02-08 LAB — HM MAMMOGRAPHY

## 2016-02-13 DIAGNOSIS — R928 Other abnormal and inconclusive findings on diagnostic imaging of breast: Secondary | ICD-10-CM | POA: Diagnosis not present

## 2016-02-13 DIAGNOSIS — R922 Inconclusive mammogram: Secondary | ICD-10-CM | POA: Diagnosis not present

## 2016-02-13 LAB — HM MAMMOGRAPHY

## 2016-02-14 ENCOUNTER — Encounter: Payer: Self-pay | Admitting: Internal Medicine

## 2016-02-15 ENCOUNTER — Encounter: Payer: Self-pay | Admitting: Internal Medicine

## 2016-04-25 ENCOUNTER — Ambulatory Visit (INDEPENDENT_AMBULATORY_CARE_PROVIDER_SITE_OTHER): Payer: Medicare Other | Admitting: Podiatry

## 2016-04-25 ENCOUNTER — Encounter: Payer: Self-pay | Admitting: Podiatry

## 2016-04-25 VITALS — BP 105/64 | HR 89

## 2016-04-25 DIAGNOSIS — M779 Enthesopathy, unspecified: Secondary | ICD-10-CM | POA: Diagnosis not present

## 2016-04-25 DIAGNOSIS — M2010 Hallux valgus (acquired), unspecified foot: Secondary | ICD-10-CM

## 2016-04-25 MED ORDER — TRIAMCINOLONE ACETONIDE 10 MG/ML IJ SUSP
10.0000 mg | Freq: Once | INTRAMUSCULAR | Status: AC
  Administered 2016-04-25: 10 mg

## 2016-04-25 NOTE — Progress Notes (Signed)
   Subjective:    Patient ID: Madison Rowe, female    DOB: 01-05-1951, 65 y.o.   MRN: [id number]  HPI    Review of Systems     Objective:   Physical Exam        Assessment & Plan:

## 2016-04-26 DIAGNOSIS — H52223 Regular astigmatism, bilateral: Secondary | ICD-10-CM | POA: Diagnosis not present

## 2016-04-26 DIAGNOSIS — H2513 Age-related nuclear cataract, bilateral: Secondary | ICD-10-CM | POA: Diagnosis not present

## 2016-04-26 DIAGNOSIS — H5203 Hypermetropia, bilateral: Secondary | ICD-10-CM | POA: Diagnosis not present

## 2016-04-26 DIAGNOSIS — H524 Presbyopia: Secondary | ICD-10-CM | POA: Diagnosis not present

## 2016-04-26 NOTE — Progress Notes (Signed)
Subjective:     Patient ID: Madison Rowe, female   DOB: 29-Dec-1950, 65 y.o.   MRN: [id number]  HPI patient presents with pain on top of the left foot depression of the arch with long-term history of orthotics that have worn out   Review of Systems     Objective:   Physical Exam Neurovascular status found to be intact with patient found to have inflammation and pain of the dorsal foot mid foot region with inflammation and fluid buildup noted. 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 Discussed heat and ice therapy that like her to use and scanned for custom orthotic devices at this time

## 2016-05-18 ENCOUNTER — Ambulatory Visit: Payer: Medicare Other | Admitting: *Deleted

## 2016-05-18 DIAGNOSIS — M722 Plantar fascial fibromatosis: Secondary | ICD-10-CM

## 2016-05-18 NOTE — Patient Instructions (Signed)

## 2016-05-18 NOTE — Progress Notes (Signed)
Patient ID: Madison Rowe, female   DOB: Apr 29, 1951, 65 y.o.   MRN: [id number]  Patient presents for orthotic pick up.  Verbal and written break in and wear instructions given.  Patient will follow up in 4 weeks if symptoms worsen or fail to improve.

## 2016-06-25 DIAGNOSIS — L900 Lichen sclerosus et atrophicus: Secondary | ICD-10-CM | POA: Diagnosis not present

## 2016-06-25 DIAGNOSIS — Z01419 Encounter for gynecological examination (general) (routine) without abnormal findings: Secondary | ICD-10-CM | POA: Diagnosis not present

## 2016-06-25 DIAGNOSIS — Z1389 Encounter for screening for other disorder: Secondary | ICD-10-CM | POA: Diagnosis not present

## 2016-06-25 DIAGNOSIS — Z6834 Body mass index (BMI) 34.0-34.9, adult: Secondary | ICD-10-CM | POA: Diagnosis not present

## 2016-06-25 DIAGNOSIS — Z124 Encounter for screening for malignant neoplasm of cervix: Secondary | ICD-10-CM | POA: Diagnosis not present

## 2016-06-25 LAB — HM PAP SMEAR

## 2016-06-25 LAB — HM MAMMOGRAPHY: HM Mammogram: ABNORMAL — AB (ref 0–4)

## 2016-06-26 DIAGNOSIS — Z1321 Encounter for screening for nutritional disorder: Secondary | ICD-10-CM | POA: Diagnosis not present

## 2016-06-26 DIAGNOSIS — Z13228 Encounter for screening for other metabolic disorders: Secondary | ICD-10-CM | POA: Diagnosis not present

## 2016-06-26 DIAGNOSIS — Z0000 Encounter for general adult medical examination without abnormal findings: Secondary | ICD-10-CM | POA: Diagnosis not present

## 2016-06-26 DIAGNOSIS — Z13220 Encounter for screening for lipoid disorders: Secondary | ICD-10-CM | POA: Diagnosis not present

## 2016-06-26 DIAGNOSIS — Z1329 Encounter for screening for other suspected endocrine disorder: Secondary | ICD-10-CM | POA: Diagnosis not present

## 2016-06-26 DIAGNOSIS — Z124 Encounter for screening for malignant neoplasm of cervix: Secondary | ICD-10-CM | POA: Diagnosis not present

## 2016-06-26 LAB — LIPID PANEL
Cholesterol: 197 mg/dL (ref 0–200)
HDL: 69 mg/dL (ref 35–70)
LDL Cholesterol: 118 mg/dL
Triglycerides: 51 mg/dL (ref 40–160)

## 2016-06-26 LAB — HEPATIC FUNCTION PANEL
ALT: 12 U/L (ref 7–35)
AST: 13 U/L (ref 13–35)
Alkaline Phosphatase: 67 U/L (ref 25–125)
BILIRUBIN, TOTAL: 0.3 mg/dL

## 2016-06-26 LAB — BASIC METABOLIC PANEL
BUN: 13 mg/dL (ref 4–21)
CREATININE: 0.7 mg/dL (ref 0.5–1.1)
GLUCOSE: 86 mg/dL
POTASSIUM: 4.9 mmol/L (ref 3.4–5.3)
SODIUM: 140 mmol/L (ref 137–147)

## 2016-06-26 LAB — CBC AND DIFFERENTIAL
HCT: 39 % (ref 36–46)
Hemoglobin: 12.9 g/dL (ref 12.0–16.0)
Neutrophils Absolute: 3 /uL
Platelets: 253 10*3/uL (ref 150–399)
WBC: 6 10^3/mL

## 2016-06-26 LAB — TSH: TSH: 4.42 u[IU]/mL (ref 0.41–5.90)

## 2016-06-27 ENCOUNTER — Encounter: Payer: Self-pay | Admitting: Internal Medicine

## 2016-06-27 LAB — VITAMIN D 25 HYDROXY (VIT D DEFICIENCY, FRACTURES): VIT D 25 HYDROXY: 36.8

## 2016-06-27 NOTE — Progress Notes (Signed)
[facility] results ordered by Dr. Marvel Plan at [facility]

## 2016-07-11 DIAGNOSIS — Z23 Encounter for immunization: Secondary | ICD-10-CM | POA: Diagnosis not present

## 2016-08-08 DIAGNOSIS — H52223 Regular astigmatism, bilateral: Secondary | ICD-10-CM | POA: Diagnosis not present

## 2016-08-08 DIAGNOSIS — H5203 Hypermetropia, bilateral: Secondary | ICD-10-CM | POA: Diagnosis not present

## 2016-08-08 DIAGNOSIS — H1850 Unspecified hereditary corneal dystrophies: Secondary | ICD-10-CM | POA: Diagnosis not present

## 2016-08-21 ENCOUNTER — Other Ambulatory Visit: Payer: Self-pay | Admitting: Internal Medicine

## 2016-08-21 ENCOUNTER — Encounter: Payer: Self-pay | Admitting: Internal Medicine

## 2016-08-21 DIAGNOSIS — R921 Mammographic calcification found on diagnostic imaging of breast: Secondary | ICD-10-CM | POA: Diagnosis not present

## 2016-08-24 ENCOUNTER — Other Ambulatory Visit: Payer: Self-pay | Admitting: Internal Medicine

## 2016-09-19 ENCOUNTER — Encounter: Payer: Self-pay | Admitting: Internal Medicine

## 2016-11-02 ENCOUNTER — Ambulatory Visit (INDEPENDENT_AMBULATORY_CARE_PROVIDER_SITE_OTHER): Payer: Medicare Other | Admitting: Podiatry

## 2016-11-02 ENCOUNTER — Encounter: Payer: Self-pay | Admitting: Podiatry

## 2016-11-02 ENCOUNTER — Ambulatory Visit (INDEPENDENT_AMBULATORY_CARE_PROVIDER_SITE_OTHER): Payer: Medicare Other

## 2016-11-02 VITALS — BP 128/69 | HR 76 | Resp 16

## 2016-11-02 DIAGNOSIS — M79672 Pain in left foot: Secondary | ICD-10-CM | POA: Diagnosis not present

## 2016-11-02 DIAGNOSIS — S92522A Displaced fracture of medial phalanx of left lesser toe(s), initial encounter for closed fracture: Secondary | ICD-10-CM

## 2016-11-02 DIAGNOSIS — M722 Plantar fascial fibromatosis: Secondary | ICD-10-CM | POA: Diagnosis not present

## 2016-11-03 NOTE — Progress Notes (Signed)
Subjective:     Patient ID: Madison Rowe, female   DOB: 06-Oct-1950, 66 y.o.   MRN: [id number]  HPI patient states she traumatized her left third toe about 2 weeks ago and it's remained very tender and difficult to wear shoe gear with   Review of Systems     Objective:   Physical Exam Neurovascular status intact negative Homan sign was noted with digit 3 left showing edema and erythema with pain in the proximal phalanx when palpated    Assessment:     Doing well with probable contusion or fracture digit 3 left    Plan:     H&P x-rays reviewed and dispensed surgical shoe with instructions on usage and the fact this will probably take 8-12 weeks to heal completely. Patient at this time will gradually return soft shoe in the next few weeks  X-ray report indicates fracture of the proximal phalanx digit 3 left that does not involve the joint surface

## 2016-11-30 ENCOUNTER — Encounter: Payer: Self-pay | Admitting: Internal Medicine

## 2016-12-04 DIAGNOSIS — L719 Rosacea, unspecified: Secondary | ICD-10-CM | POA: Diagnosis not present

## 2017-01-22 DIAGNOSIS — L719 Rosacea, unspecified: Secondary | ICD-10-CM | POA: Diagnosis not present

## 2017-01-30 ENCOUNTER — Ambulatory Visit (AMBULATORY_SURGERY_CENTER): Payer: Self-pay

## 2017-01-30 VITALS — Ht 64.0 in | Wt 198.8 lb

## 2017-01-30 DIAGNOSIS — Z86010 Personal history of colonic polyps: Secondary | ICD-10-CM

## 2017-01-30 MED ORDER — NA SULFATE-K SULFATE-MG SULF 17.5-3.13-1.6 GM/177ML PO SOLN: 1.0000 | Freq: Once | ORAL | 0 refills | Status: AC

## 2017-01-30 NOTE — Progress Notes (Signed)
Denies allergies to eggs or soy products. Denies complication of anesthesia or sedation. Denies use of weight loss medication. Denies use of O2.   Emmi instructions given for colonoscopy.  

## 2017-02-12 ENCOUNTER — Ambulatory Visit (AMBULATORY_SURGERY_CENTER): Payer: Medicare Other | Admitting: Internal Medicine

## 2017-02-12 ENCOUNTER — Encounter: Payer: Self-pay | Admitting: Internal Medicine

## 2017-02-12 VITALS — BP 107/55 | HR 65 | Temp 96.8°F | Resp 9 | Ht 64.0 in | Wt 198.0 lb

## 2017-02-12 DIAGNOSIS — Z1211 Encounter for screening for malignant neoplasm of colon: Secondary | ICD-10-CM | POA: Diagnosis not present

## 2017-02-12 DIAGNOSIS — D122 Benign neoplasm of ascending colon: Secondary | ICD-10-CM | POA: Diagnosis not present

## 2017-02-12 DIAGNOSIS — Z1212 Encounter for screening for malignant neoplasm of rectum: Secondary | ICD-10-CM | POA: Diagnosis not present

## 2017-02-12 DIAGNOSIS — Z86010 Personal history of colonic polyps: Secondary | ICD-10-CM

## 2017-02-12 LAB — HM COLONOSCOPY

## 2017-02-12 MED ORDER — SODIUM CHLORIDE 0.9 % IV SOLN: 500.0000 mL | INTRAVENOUS | Status: DC

## 2017-02-12 NOTE — Patient Instructions (Signed)
Colon polyp x 1 removed today. Colon polyp, diverticulosis, and hemorrhoid handouts given.  Result letter in your mail in 2-3 weeks. Resume current medications.  Call us with any questions or concerns. Thank you!   YOU HAD AN ENDOSCOPIC PROCEDURE TODAY AT [facility]:   Refer to the procedure report that was given to you for any specific questions about what was found during the examination.  If the procedure report does not answer your questions, please call your gastroenterologist to clarify.  If you requested that your care partner not be given the details of your procedure findings, then the procedure report has been included in a sealed envelope for you to review at your convenience later.  YOU SHOULD EXPECT: Some feelings of bloating in the abdomen. Passage of more gas than usual.  Walking can help get rid of the air that was put into your GI tract during the procedure and reduce the bloating. If you had a lower endoscopy (such as a colonoscopy or flexible sigmoidoscopy) you may notice spotting of blood in your stool or on the toilet paper. If you underwent a bowel prep for your procedure, you may not have a normal bowel movement for a few days.  Please Note:  You might notice some irritation and congestion in your nose or some drainage.  This is from the oxygen used during your procedure.  There is no need for concern and it should clear up in a day or so.  SYMPTOMS TO REPORT IMMEDIATELY:   Following lower endoscopy (colonoscopy or flexible sigmoidoscopy):  Excessive amounts of blood in the stool  Significant tenderness or worsening of abdominal pains  Swelling of the abdomen that is new, acute  Fever of 100F or higher  For urgent or emergent issues, a gastroenterologist can be reached at any hour by calling [phone].   DIET:  We do recommend a small meal at first, but then you may proceed to your regular diet.  Drink plenty of fluids but you should avoid  alcoholic beverages for 24 hours.  ACTIVITY:  You should plan to take it easy for the rest of today and you should NOT DRIVE or use heavy machinery until tomorrow (because of the sedation medicines used during the test).    FOLLOW UP: Our staff will call the number listed on your records the next business day following your procedure to check on you and address any questions or concerns that you may have regarding the information given to you following your procedure. If we do not reach you, we will leave a message.  However, if you are feeling well and you are not experiencing any problems, there is no need to return our call.  We will assume that you have returned to your regular daily activities without incident.  If any biopsies were taken you will be contacted by phone or by letter within the next 1-3 weeks.  Please call us at [phone] if you have not heard about the biopsies in 3 weeks.    SIGNATURES/CONFIDENTIALITY: You and/or your care partner have signed paperwork which will be entered into your electronic medical record.  These signatures attest to the fact that that the information above on your After Visit Summary has been reviewed and is understood.  Full responsibility of the confidentiality of this discharge information lies with you and/or your care-partner.

## 2017-02-12 NOTE — Progress Notes (Signed)
Called to room to assist during endoscopic procedure.  Patient ID and intended procedure confirmed with present staff. Received instructions for my participation in the procedure from the performing physician.  

## 2017-02-12 NOTE — Progress Notes (Signed)
Pt's states no medical or surgical changes since previsit or office visit. 

## 2017-02-12 NOTE — Progress Notes (Signed)
Report to PACU, RN, vss, BBS= Clear.  

## 2017-02-12 NOTE — Op Note (Signed)
[place] Patient Name: Madison Rowe Procedure Date: 02/12/2017 11:35 AM MRN: [id number] Endoscopist: Docia Chuck. Henrene Pastor , MD Age: 66 Referring MD:  Date of Birth: 12-Oct-1950 Gender: Female Account #: [id number] Procedure:                Colonoscopy, with cold snare polypectomy x 1 Indications:              Screening for colorectal malignant neoplasm.                            Negative index exam 2008 Medicines:                Monitored Anesthesia Care Procedure:                Pre-Anesthesia Assessment:                           - Prior to the procedure, a History and Physical                            was performed, and patient medications and                            allergies were reviewed. The patient's tolerance of                            previous anesthesia was also reviewed. The risks                            and benefits of the procedure and the sedation                            options and risks were discussed with the patient.                            All questions were answered, and informed consent                            was obtained. Prior Anticoagulants: The patient has                            taken no previous anticoagulant or antiplatelet                            agents. ASA Grade Assessment: II - A patient with                            mild systemic disease. After reviewing the risks                            and benefits, the patient was deemed in                            satisfactory condition to undergo the procedure.  After obtaining informed consent, the colonoscope                            was passed under direct vision. Throughout the                            procedure, the patient's blood pressure, pulse, and                            oxygen saturations were monitored continuously. The                            Colonoscope was introduced through the anus and                            advanced to  the the cecum, identified by                            appendiceal orifice and ileocecal valve. The                            ileocecal valve, appendiceal orifice, and rectum                            were photographed. The quality of the bowel                            preparation was excellent. The colonoscopy was                            performed without difficulty. The patient tolerated                            the procedure well. The bowel preparation used was                            SUPREP. Scope In: 11:44:51 AM Scope Out: 11:56:31 AM Scope Withdrawal Time: 0 hours 9 minutes 43 seconds  Total Procedure Duration: 0 hours 11 minutes 40 seconds  Findings:                 A 2 mm polyp was found in the ascending colon. The                            polyp was removed with a cold snare. Resection and                            retrieval were complete.                           Multiple medium-mouthed diverticula were found in                            the sigmoid colon.  Internal hemorrhoids were found during retroflexion.                           The exam was otherwise without abnormality on                            direct and retroflexion views. Complications:            No immediate complications. Estimated blood loss:                            None. Estimated Blood Loss:     Estimated blood loss: none. Impression:               - One 2 mm polyp in the ascending colon, removed                            with a cold snare. Resected and retrieved.                           - Diverticulosis in the sigmoid colon.                           - Internal hemorrhoids.                           - The examination was otherwise normal on direct                            and retroflexion views. Recommendation:           - Repeat colonoscopy in 5-10 years for surveillance.                           - Patient has a contact number available for                             emergencies. The signs and symptoms of potential                            delayed complications were discussed with the                            patient. Return to normal activities tomorrow.                            Written discharge instructions were provided to the                            patient.                           - Resume previous diet.                           - Continue present medications.                           -  Await pathology results. Docia Chuck. Henrene Pastor, MD 02/12/2017 12:00:45 PM This report has been signed electronically.

## 2017-02-13 ENCOUNTER — Telehealth: Payer: Self-pay | Admitting: *Deleted

## 2017-02-13 NOTE — Telephone Encounter (Signed)
  Follow up Call-  Call back number 02/12/2017  Post procedure Call Back phone  # [phone]  Permission to leave phone message Yes  Some recent data might be hidden     Patient questions:  Do you have a fever, pain , or abdominal swelling? No. Pain Score  0 *  Have you tolerated food without any problems? Yes.    Have you been able to return to your normal activities? Yes.    Do you have any questions about your discharge instructions: Diet   No. Medications  No. Follow up visit  No.  Do you have questions or concerns about your Care? No.  Actions: * If pain score is 4 or above: No action needed, pain <4.

## 2017-02-18 ENCOUNTER — Encounter: Payer: Self-pay | Admitting: Internal Medicine

## 2017-03-05 ENCOUNTER — Ambulatory Visit (INDEPENDENT_AMBULATORY_CARE_PROVIDER_SITE_OTHER): Payer: Medicare Other | Admitting: Internal Medicine

## 2017-03-05 ENCOUNTER — Encounter: Payer: Self-pay | Admitting: Internal Medicine

## 2017-03-05 ENCOUNTER — Other Ambulatory Visit (INDEPENDENT_AMBULATORY_CARE_PROVIDER_SITE_OTHER): Payer: Medicare Other

## 2017-03-05 VITALS — BP 124/78 | HR 96 | Temp 98.3°F | Resp 16 | Wt 193.0 lb

## 2017-03-05 DIAGNOSIS — R1030 Lower abdominal pain, unspecified: Secondary | ICD-10-CM

## 2017-03-05 LAB — CBC WITH DIFFERENTIAL/PLATELET
BASOS ABS: 0.1 10*3/uL (ref 0.0–0.1)
Basophils Relative: 0.5 % (ref 0.0–3.0)
EOS ABS: 0.1 10*3/uL (ref 0.0–0.7)
Eosinophils Relative: 0.7 % (ref 0.0–5.0)
HEMATOCRIT: 33.9 % — AB (ref 36.0–46.0)
HEMOGLOBIN: 11.1 g/dL — AB (ref 12.0–15.0)
LYMPHS PCT: 19.3 % (ref 12.0–46.0)
Lymphs Abs: 1.9 10*3/uL (ref 0.7–4.0)
MCHC: 32.8 g/dL (ref 30.0–36.0)
MCV: 81.1 fl (ref 78.0–100.0)
MONOS PCT: 8.8 % (ref 3.0–12.0)
Monocytes Absolute: 0.8 10*3/uL (ref 0.1–1.0)
Neutro Abs: 6.8 10*3/uL (ref 1.4–7.7)
Neutrophils Relative %: 70.7 % (ref 43.0–77.0)
Platelets: 224 10*3/uL (ref 150.0–400.0)
RBC: 4.18 Mil/uL (ref 3.87–5.11)
RDW: 18.3 % — ABNORMAL HIGH (ref 11.5–15.5)
WBC: 9.7 10*3/uL (ref 4.0–10.5)

## 2017-03-05 LAB — COMPREHENSIVE METABOLIC PANEL
ALK PHOS: 62 U/L (ref 39–117)
ALT: 13 U/L (ref 0–35)
AST: 13 U/L (ref 0–37)
Albumin: 4.2 g/dL (ref 3.5–5.2)
BUN: 16 mg/dL (ref 6–23)
CO2: 28 mEq/L (ref 19–32)
Calcium: 10 mg/dL (ref 8.4–10.5)
Chloride: 100 mEq/L (ref 96–112)
Creatinine, Ser: 0.62 mg/dL (ref 0.40–1.20)
GFR: 101.65 mL/min (ref >60.00)
GLUCOSE: 96 mg/dL (ref 70–99)
POTASSIUM: 4.1 meq/L (ref 3.5–5.1)
Sodium: 137 mEq/L (ref 135–145)
TOTAL PROTEIN: 7 g/dL (ref 6.0–8.3)
Total Bilirubin: 0.5 mg/dL (ref 0.2–1.2)

## 2017-03-05 LAB — URINALYSIS, ROUTINE W REFLEX MICROSCOPIC
BILIRUBIN URINE: NEGATIVE
Hgb urine dipstick: NEGATIVE
KETONES UR: 15 — AB
LEUKOCYTES UA: NEGATIVE
NITRITE: NEGATIVE
PH: 6 (ref 5.0–8.0)
Specific Gravity, Urine: >=1.03 — AB (ref 1.000–1.030)
Urine Glucose: NEGATIVE
Urobilinogen, UA: 0.2 (ref 0.0–1.0)

## 2017-03-05 NOTE — Patient Instructions (Signed)
  Test(s) ordered today. Your results will be released to MyChart (or called to you) after review, usually within 72hours after test completion. If any changes need to be made, you will be notified at that same time.    Medications reviewed and updated.  No changes recommended at this time.     

## 2017-03-05 NOTE — Progress Notes (Signed)
Subjective:    Patient ID: Madison Rowe, female    DOB: [birth year], 66 y.o.   MRN: [id number]  HPI She is here for an acute visit.   Abdominal pain:  Her pain started two days ago.  She has had lower abdominal pain and a lot of gas.  Over the weekend she would go to the bathroom, but only a small amount.  Last night she took a laxative and had a good bowel movement today and feels better.  She still has some discomfort in her lower abdomen, but it is much better.  She does have issues with chronic constipation, but nothing like this in the past.  She had a low grade fever of the weekend and possible chills, but was also in air conditioning.  She also had some aches over the weekend.  She denies cold symptoms and urinary symptoms.    Medications and allergies reviewed with patient and updated if appropriate.  Patient Active Problem List   Diagnosis Date Noted  . Other specified hypothyroidism 05/26/2015  . Eczema, dyshidrotic 05/26/2015  . Vitamin D deficiency 05/26/2015  . Back pain 11/12/2013  . Osteoporosis 09/27/2011  . Mixed hyperlipidemia 09/27/2011  . Anemia 09/27/2011  . Preventative health care 08/13/2011  . Overweight(278.02) 03/17/2008  . ALLERGIC RHINITIS 03/17/2008    Current Outpatient Prescriptions on File Prior to Visit  Medication Sig Dispense Refill  . Cholecalciferol (VITAMIN D-3 PO) Take by mouth daily.    . clobetasol cream (TEMOVATE) 3.53 % Apply 1 application topically 2 (two) times daily.    Marland Kitchen conjugated estrogens (PREMARIN) vaginal cream Place vaginally as directed. 2-3 times weekly.    . Dietary Management Product (FOSTEUM PLUS) CAPS Take 1 capsule by mouth 2 (two) times daily. (Patient taking differently: Take 1 capsule by mouth 2 (two) times daily. For Bone density) 180 capsule 3  . Doxycycline Hyclate 50 MG TBEC Take 1 tablet by mouth every other day.    . fluticasone (FLONASE) 50 MCG/ACT nasal spray instill 2 sprays into each nostril once daily 16 g  11  . ibuprofen (ADVIL,MOTRIN) 200 MG tablet Take 200 mg by mouth every 6 (six) hours as needed.    . loratadine (CLARITIN) 10 MG tablet Take 10 mg by mouth daily.     Current Facility-Administered Medications on File Prior to Visit  Medication Dose Route Frequency Provider Last Rate Last Dose  . 0.9 %  sodium chloride infusion  500 mL Intravenous Continuous Irene Shipper, MD        Past Medical History:  Diagnosis Date  . Allergy   . Anemia   . Arthritis   . Osteoporosis 2010  . Vitamin D deficient osteomalacia 2010    Past Surgical History:  Procedure Laterality Date  . BREAST SURGERY    . ECTOPIC PREGNANCY SURGERY    . FOOT SURGERY Left   . TONSILLECTOMY      Social History   Social History  . Marital status: Married    Spouse name: N/A  . Number of children: N/A  . Years of education: N/A   Social History Main Topics  . Smoking status: Never Smoker  . Smokeless tobacco: Never Used  . Alcohol use Yes     Comment: Occasional  . Drug use: No  . Sexual activity: Yes    Birth control/ protection: Surgical   Other Topics Concern  . Not on file   Social History Narrative  . No narrative on file  Family History  Problem Relation Age of Onset  . Kidney disease Father   . Kidney disease Maternal Grandfather   . Cancer Neg Hx   . Heart disease Neg Hx   . Colon cancer Neg Hx   . Esophageal cancer Neg Hx   . Rectal cancer Neg Hx   . Stomach cancer Neg Hx     Review of Systems  Constitutional: Positive for chills and fatigue. Negative for fever (99.2 yesterday).  HENT: Positive for congestion (chronic). Negative for ear pain, sinus pressure and sore throat.   Respiratory: Negative for cough, shortness of breath and wheezing.   Cardiovascular: Negative for chest pain, palpitations and leg swelling.  Gastrointestinal: Positive for abdominal pain and constipation. Negative for blood in stool, diarrhea, nausea and vomiting.  Genitourinary: Negative for dysuria,  frequency and hematuria.  Musculoskeletal: Positive for myalgias (generalized achiness) and neck stiffness.  Neurological: Negative for dizziness and headaches.       Objective:   Vitals:   03/05/17 1427  BP: 124/78  Pulse: 96  Resp: 16  Temp: 98.3 F (36.8 C)   Filed Weights   03/05/17 1427  Weight: 193 lb (87.5 kg)   Body mass index is 33.13 kg/m.  Wt Readings from Last 3 Encounters:  03/05/17 193 lb (87.5 kg)  02/12/17 198 lb (89.8 kg)  01/30/17 198 lb 12.8 oz (90.2 kg)     Physical Exam  Constitutional: She appears well-developed and well-nourished. No distress.  HENT:  Head: Normocephalic and atraumatic.  Right Ear: External ear normal.  Left Ear: External ear normal.  Mouth/Throat: Oropharynx is clear and moist.  B/l ear canals and TM normal  Eyes: Conjunctivae are normal.  Neck: Neck supple. No tracheal deviation present. No thyromegaly present.  Cardiovascular: Normal rate, regular rhythm and normal heart sounds.   No murmur heard. Pulmonary/Chest: Effort normal and breath sounds normal. No respiratory distress. She has no wheezes. She has no rales.  Abdominal: Soft. Bowel sounds are normal. She exhibits no distension and no mass. There is tenderness (minimal tenderness in central lower abdomen with palpation). There is no rebound and no guarding.  Musculoskeletal: She exhibits no edema.  Lymphadenopathy:    She has no cervical adenopathy.  Skin: Skin is warm and dry. She is not diaphoretic.          Assessment & Plan:   See Problem List for Assessment and Plan of chronic medical problems.

## 2017-03-05 NOTE — Assessment & Plan Note (Signed)
Appears non-toxic and has minimal discomfort Symptoms likely related to constipation - much improved after taking a laxative She is going out of the country tomorrow There is a slight risk of infection - despite minimal symptoms so we will check UA, UCx, cbc, cmp to ensure no abnormalities suggestive of an infection since she is leaving the country.

## 2017-03-06 LAB — URINE CULTURE: ORGANISM ID, BACTERIA: NO GROWTH

## 2017-03-21 DIAGNOSIS — R921 Mammographic calcification found on diagnostic imaging of breast: Secondary | ICD-10-CM | POA: Diagnosis not present

## 2017-03-21 DIAGNOSIS — M8589 Other specified disorders of bone density and structure, multiple sites: Secondary | ICD-10-CM | POA: Diagnosis not present

## 2017-03-21 LAB — HM DEXA SCAN: HM Dexa Scan: -1.2

## 2017-03-21 LAB — HM MAMMOGRAPHY

## 2017-03-26 ENCOUNTER — Encounter: Payer: Self-pay | Admitting: Internal Medicine

## 2017-03-28 ENCOUNTER — Encounter: Payer: Self-pay | Admitting: Internal Medicine

## 2017-03-28 NOTE — Progress Notes (Signed)
Abstracted and sent to scan  

## 2017-04-15 ENCOUNTER — Encounter: Payer: Self-pay | Admitting: Physician Assistant

## 2017-04-15 ENCOUNTER — Ambulatory Visit: Payer: Medicare Other | Admitting: Family Medicine

## 2017-04-15 ENCOUNTER — Ambulatory Visit (INDEPENDENT_AMBULATORY_CARE_PROVIDER_SITE_OTHER): Payer: Medicare Other | Admitting: Physician Assistant

## 2017-04-15 VITALS — BP 126/80 | HR 71 | Temp 98.3°F | Ht 64.0 in | Wt 195.0 lb

## 2017-04-15 DIAGNOSIS — R300 Dysuria: Secondary | ICD-10-CM | POA: Diagnosis not present

## 2017-04-15 LAB — POCT URINALYSIS DIPSTICK
Bilirubin, UA: NEGATIVE
GLUCOSE UA: NEGATIVE
Ketones, UA: NEGATIVE
Leukocytes, UA: NEGATIVE
NITRITE UA: NEGATIVE
PROTEIN UA: NEGATIVE
RBC UA: NEGATIVE
Spec Grav, UA: 1.015 (ref 1.010–1.025)
UROBILINOGEN UA: 0.2 U/dL
pH, UA: 6 (ref 5.0–8.0)

## 2017-04-15 MED ORDER — CEPHALEXIN 500 MG PO CAPS: 500.0000 mg | ORAL_CAPSULE | Freq: Two times a day (BID) | ORAL | 0 refills | Status: AC

## 2017-04-15 NOTE — Patient Instructions (Signed)
It was great to meet you!  Push fluids, keep urine mostly clear.  Follow-up with [place] if symptoms worsen or persist. We will call you with your urine culture and any changes to our plans.  Dysuria Dysuria is pain or discomfort while urinating. The pain or discomfort may be felt in the tube that carries urine out of the bladder (urethra) or in the surrounding tissue of the genitals. The pain may also be felt in the groin area, lower abdomen, and lower back. You may have to urinate frequently or have the sudden feeling that you have to urinate (urgency). Dysuria can affect both men and women, but is more common in women. Dysuria can be caused by many different things, including:  Urinary tract infection in women.  Infection of the kidney or bladder.  Kidney stones or bladder stones.  Certain sexually transmitted infections (STIs), such as chlamydia.  Dehydration.  Inflammation of the vagina.  Use of certain medicines.  Use of certain soaps or scented products that cause irritation.  Follow these instructions at home: Watch your dysuria for any changes. The following actions may help to reduce any discomfort you are feeling:  Drink enough fluid to keep your urine clear or pale yellow.  Empty your bladder often. Avoid holding urine for long periods of time.  After a bowel movement or urination, women should cleanse from front to back, using each tissue only once.  Empty your bladder after sexual intercourse.  Take medicines only as directed by your health care provider.  If you were prescribed an antibiotic medicine, finish it all even if you start to feel better.  Avoid caffeine, tea, and alcohol. They can irritate the bladder and make dysuria worse. In men, alcohol may irritate the prostate.  Keep all follow-up visits as directed by your health care provider. This is important.  If you had any tests done to find the cause of dysuria, it is your responsibility to obtain your  test results. Ask the lab or department performing the test when and how you will get your results. Talk with your health care provider if you have any questions about your results.  Contact a health care provider if:  You develop pain in your back or sides.  You have a fever.  You have nausea or vomiting.  You have blood in your urine.  You are not urinating as often as you usually do. Get help right away if:  You pain is severe and not relieved with medicines.  You are unable to hold down any fluids.  You or someone else notices a change in your mental function.  You have a rapid heartbeat at rest.  You have shaking or chills.  You feel extremely weak. This information is not intended to replace advice given to you by your health care provider. Make sure you discuss any questions you have with your health care provider. Document Released: 05/25/2004 Document Revised: 02/02/2016 Document Reviewed: 04/22/2014 Elsevier Interactive Patient Education  [employer].

## 2017-04-15 NOTE — Progress Notes (Signed)
Madison Rowe is a 66 y.o. female here for a new problem.  History of Present Illness:   Chief Complaint  Patient presents with  . Dysuria    burning and frequent urination for a week- took strip test and tested positive for UTI- no fever- does not feel urgency but pressure    HPI   Patient reports burning with urination x 1 week. She took urine strip test x 2 at home and both tested "posiitve" for leukocytes. She denies fever, chills, vaginal discharge, concerns for STD, pain or bleeding with sex. Does not have urgency but does have pressure. Did have an episode of diarrhea last night, but has not had anything to eat today. Did have an office visit on 03/05/17 for abdominal pain and was suspected to have mild constipation. She does have a hx of cystitis -- it has been several years since she has had this; denies hx of kidney stones or pyelonephritis.  She is planning to go out of town for a few days with her husband.   Past Medical History:  Diagnosis Date  . Allergy   . Anemia   . Arthritis   . Osteoporosis 2010  . Vitamin D deficient osteomalacia 2010     Social History   Social History  . Marital status: Married    Spouse name: N/A  . Number of children: N/A  . Years of education: N/A   Occupational History  . Not on file.   Social History Main Topics  . Smoking status: Never Smoker  . Smokeless tobacco: Never Used  . Alcohol use Yes     Comment: Occasional  . Drug use: No  . Sexual activity: Yes    Birth control/ protection: Surgical   Other Topics Concern  . Not on file   Social History Narrative  . No narrative on file    Past Surgical History:  Procedure Laterality Date  . BREAST SURGERY    . ECTOPIC PREGNANCY SURGERY    . FOOT SURGERY Left   . TONSILLECTOMY      Family History  Problem Relation Age of Onset  . Kidney disease Father   . Kidney disease Maternal Grandfather   . Cancer Neg Hx   . Heart disease Neg Hx   . Colon cancer Neg Hx    . Esophageal cancer Neg Hx   . Rectal cancer Neg Hx   . Stomach cancer Neg Hx     Allergies  Allergen Reactions  . Alendronate Sodium [Alendronate Sodium]     nausea  . Codeine     Current Medications:   Current Outpatient Prescriptions:  .  clobetasol cream (TEMOVATE) 7.86 %, Apply 1 application topically 2 (two) times daily., Disp: , Rfl:  .  conjugated estrogens (PREMARIN) vaginal cream, Place vaginally as directed. 2-3 times weekly., Disp: , Rfl:  .  Dietary Management Product (FOSTEUM PLUS) CAPS, Take 1 capsule by mouth 2 (two) times daily. (Patient taking differently: Take 1 capsule by mouth 2 (two) times daily. For Bone density), Disp: 180 capsule, Rfl: 3 .  fluticasone (FLONASE) 50 MCG/ACT nasal spray, instill 2 sprays into each nostril once daily, Disp: 16 g, Rfl: 11 .  ibuprofen (ADVIL,MOTRIN) 200 MG tablet, Take 200 mg by mouth every 6 (six) hours as needed., Disp: , Rfl:  .  loratadine (CLARITIN) 10 MG tablet, Take 10 mg by mouth daily., Disp: , Rfl:  .  cephALEXin (KEFLEX) 500 MG capsule, Take 1 capsule (500 mg total)  by mouth 2 (two) times daily., Disp: 10 capsule, Rfl: 0 .  Cholecalciferol (VITAMIN D-3 PO), Take by mouth daily., Disp: , Rfl:   Current Facility-Administered Medications:  .  0.9 %  sodium chloride infusion, 500 mL, Intravenous, Continuous, Irene Shipper, MD   Review of Systems:   Review of Systems  Constitutional: Negative for chills, fever, malaise/fatigue and weight loss.  Gastrointestinal: Positive for diarrhea. Negative for abdominal pain, blood in stool, heartburn, nausea and vomiting.  Genitourinary: Positive for dysuria. Negative for flank pain, frequency, hematuria and urgency.    Vitals:   Vitals:   04/15/17 1107  BP: 126/80  Pulse: 71  Temp: 98.3 F (36.8 C)  SpO2: 96%  Weight: 195 lb (88.5 kg)  Height: 5\' 4"  (1.626 m)     Body mass index is 33.47 kg/m.  Physical Exam:   Physical Exam  Constitutional: She appears  well-developed. She is cooperative.  Non-toxic appearance. She does not have a sickly appearance. She does not appear ill. No distress.  Cardiovascular: Normal rate, regular rhythm, S1 normal, S2 normal, normal heart sounds and normal pulses.   No LE edema  Pulmonary/Chest: Effort normal and breath sounds normal.  Abdominal: Normal appearance and bowel sounds are normal. There is no tenderness. There is no rigidity, no rebound, no guarding and no CVA tenderness.  Neurological: She is alert. GCS eye subscore is 4. GCS verbal subscore is 5. GCS motor subscore is 6.  Skin: Skin is warm, dry and intact.  Psychiatric: She has a normal mood and affect. Her speech is normal and behavior is normal.  Nursing note and vitals reviewed.   Results for orders placed or performed in visit on 04/15/17  POC Urinalysis Dipstick  Result Value Ref Range   Color, UA yellow    Clarity, UA clear    Glucose, UA Negative    Bilirubin, UA Negative    Ketones, UA Negative    Spec Grav, UA 1.015 1.010 - 1.025   Blood, UA Negative    pH, UA 6.0 5.0 - 8.0   Protein, UA Negative    Urobilinogen, UA 0.2 0.2 or 1.0 E.U./dL   Nitrite, UA Negative    Leukocytes, UA Negative Negative    Assessment and Plan:    Madison Rowe was seen today for dysuria.  Diagnoses and all orders for this visit:  Burning with urination Urine is essentially normal today. She is going out of town for the rest of the week, so we have sent in a safety net prescription in case symptoms worsen. I recommended working on staying well hydrated and keeping bowels regular. Will send urine off for culture, plan to call her mobile when results are in as she will be out of town. Seek medical attention if symptoms worsen or do not improve with treatment. -     POC Urinalysis Dipstick -     Culture, Urine  Other orders -     cephALEXin (KEFLEX) 500 MG capsule; Take 1 capsule (500 mg total) by mouth 2 (two) times daily.  . Reviewed expectations re:  course of current medical issues. . Discussed self-management of symptoms. . Outlined signs and symptoms indicating need for more acute intervention. . Patient verbalized understanding and all questions were answered. . See orders for this visit as documented in the electronic medical record. . Patient received an After-Visit Summary.  CMA or LPN served as scribe during this visit. History, Physical, and Plan performed by medical provider. Documentation and orders  reviewed and attested to.  Inda Coke, PA-C

## 2017-04-16 LAB — URINE CULTURE: Organism ID, Bacteria: NO GROWTH

## 2017-05-20 ENCOUNTER — Other Ambulatory Visit: Payer: Self-pay | Admitting: Internal Medicine

## 2017-05-20 DIAGNOSIS — M810 Age-related osteoporosis without current pathological fracture: Secondary | ICD-10-CM

## 2017-06-13 ENCOUNTER — Telehealth: Payer: Self-pay | Admitting: *Deleted

## 2017-06-13 NOTE — Telephone Encounter (Signed)
Rec'd call pt states she has been trying to get a refill on the Fostuem plus capsule. They stated they have fax MD for renewal but have not receive call back. Inform pt per chart refill came in on 9/10, and MD approve will contact them to check status, and give her a call back...Johny Chess

## 2017-06-13 NOTE — Telephone Encounter (Signed)
Called [facility] gave pt info... Per [name] they did not receive script. Gave MD authorization to fill from 05/20/17. Called pt back inform her they should be shipping me out today per [name]...Madison Rowe

## 2017-06-17 DIAGNOSIS — Z23 Encounter for immunization: Secondary | ICD-10-CM | POA: Diagnosis not present

## 2017-07-11 DIAGNOSIS — Z23 Encounter for immunization: Secondary | ICD-10-CM | POA: Diagnosis not present

## 2017-07-26 DIAGNOSIS — Z01419 Encounter for gynecological examination (general) (routine) without abnormal findings: Secondary | ICD-10-CM | POA: Diagnosis not present

## 2017-07-26 DIAGNOSIS — L900 Lichen sclerosus et atrophicus: Secondary | ICD-10-CM | POA: Diagnosis not present

## 2017-07-26 DIAGNOSIS — Z0000 Encounter for general adult medical examination without abnormal findings: Secondary | ICD-10-CM | POA: Diagnosis not present

## 2017-07-26 DIAGNOSIS — Z1389 Encounter for screening for other disorder: Secondary | ICD-10-CM | POA: Diagnosis not present

## 2017-12-09 ENCOUNTER — Other Ambulatory Visit: Payer: Self-pay | Admitting: Internal Medicine

## 2017-12-09 DIAGNOSIS — M810 Age-related osteoporosis without current pathological fracture: Secondary | ICD-10-CM

## 2017-12-18 DIAGNOSIS — H1850 Unspecified hereditary corneal dystrophies: Secondary | ICD-10-CM | POA: Diagnosis not present

## 2017-12-18 DIAGNOSIS — H52223 Regular astigmatism, bilateral: Secondary | ICD-10-CM | POA: Diagnosis not present

## 2017-12-18 DIAGNOSIS — H25813 Combined forms of age-related cataract, bilateral: Secondary | ICD-10-CM | POA: Diagnosis not present

## 2017-12-18 DIAGNOSIS — H3562 Retinal hemorrhage, left eye: Secondary | ICD-10-CM | POA: Diagnosis not present

## 2017-12-18 DIAGNOSIS — H5203 Hypermetropia, bilateral: Secondary | ICD-10-CM | POA: Diagnosis not present

## 2018-01-01 ENCOUNTER — Ambulatory Visit (INDEPENDENT_AMBULATORY_CARE_PROVIDER_SITE_OTHER): Payer: Medicare Other | Admitting: Internal Medicine

## 2018-01-01 ENCOUNTER — Encounter: Payer: Self-pay | Admitting: Internal Medicine

## 2018-01-01 ENCOUNTER — Other Ambulatory Visit (INDEPENDENT_AMBULATORY_CARE_PROVIDER_SITE_OTHER): Payer: Medicare Other

## 2018-01-01 VITALS — BP 158/88 | HR 72 | Temp 98.0°F | Resp 16 | Ht 64.0 in | Wt 194.5 lb

## 2018-01-01 DIAGNOSIS — Z0000 Encounter for general adult medical examination without abnormal findings: Secondary | ICD-10-CM

## 2018-01-01 DIAGNOSIS — E559 Vitamin D deficiency, unspecified: Secondary | ICD-10-CM

## 2018-01-01 DIAGNOSIS — D539 Nutritional anemia, unspecified: Secondary | ICD-10-CM | POA: Diagnosis not present

## 2018-01-01 DIAGNOSIS — I10 Essential (primary) hypertension: Secondary | ICD-10-CM

## 2018-01-01 DIAGNOSIS — E038 Other specified hypothyroidism: Secondary | ICD-10-CM | POA: Diagnosis not present

## 2018-01-01 DIAGNOSIS — H35032 Hypertensive retinopathy, left eye: Secondary | ICD-10-CM | POA: Diagnosis not present

## 2018-01-01 DIAGNOSIS — E785 Hyperlipidemia, unspecified: Secondary | ICD-10-CM | POA: Diagnosis not present

## 2018-01-01 LAB — COMPREHENSIVE METABOLIC PANEL
ALBUMIN: 4.5 g/dL (ref 3.5–5.2)
ALK PHOS: 59 U/L (ref 39–117)
ALT: 15 U/L (ref 0–35)
AST: 17 U/L (ref 0–37)
BUN: 14 mg/dL (ref 6–23)
CALCIUM: 10.3 mg/dL (ref 8.4–10.5)
CO2: 31 mEq/L (ref 19–32)
CREATININE: 0.62 mg/dL (ref 0.40–1.20)
Chloride: 100 mEq/L (ref 96–112)
GFR: 101.4 mL/min (ref >60.00)
Glucose, Bld: 91 mg/dL (ref 70–99)
POTASSIUM: 4.5 meq/L (ref 3.5–5.1)
SODIUM: 137 meq/L (ref 135–145)
TOTAL PROTEIN: 7.2 g/dL (ref 6.0–8.3)
Total Bilirubin: 0.4 mg/dL (ref 0.2–1.2)

## 2018-01-01 LAB — CBC WITH DIFFERENTIAL/PLATELET
BASOS PCT: 1 % (ref 0.0–3.0)
Basophils Absolute: 0.1 10*3/uL (ref 0.0–0.1)
EOS PCT: 1.5 % (ref 0.0–5.0)
Eosinophils Absolute: 0.1 10*3/uL (ref 0.0–0.7)
HEMATOCRIT: 39.1 % (ref 36.0–46.0)
HEMOGLOBIN: 13 g/dL (ref 12.0–15.0)
LYMPHS PCT: 28.1 % (ref 12.0–46.0)
Lymphs Abs: 2 10*3/uL (ref 0.7–4.0)
MCHC: 33.2 g/dL (ref 30.0–36.0)
MCV: 87.5 fl (ref 78.0–100.0)
MONOS PCT: 7.3 % (ref 3.0–12.0)
Monocytes Absolute: 0.5 10*3/uL (ref 0.1–1.0)
Neutro Abs: 4.5 10*3/uL (ref 1.4–7.7)
Neutrophils Relative %: 62.1 % (ref 43.0–77.0)
Platelets: 195 10*3/uL (ref 150.0–400.0)
RBC: 4.47 Mil/uL (ref 3.87–5.11)
RDW: 14.9 % (ref 11.5–15.5)
WBC: 7.2 10*3/uL (ref 4.0–10.5)

## 2018-01-01 LAB — LIPID PANEL
CHOLESTEROL: 207 mg/dL — AB (ref 0–200)
HDL: 73.4 mg/dL (ref >39.00)
LDL Cholesterol: 121 mg/dL — ABNORMAL HIGH (ref 0–99)
NONHDL: 134.02
Total CHOL/HDL Ratio: 3
Triglycerides: 63 mg/dL (ref 0.0–149.0)
VLDL: 12.6 mg/dL (ref 0.0–40.0)

## 2018-01-01 LAB — FERRITIN: Ferritin: 9.6 ng/mL — ABNORMAL LOW (ref 10.0–291.0)

## 2018-01-01 LAB — VITAMIN D 25 HYDROXY (VIT D DEFICIENCY, FRACTURES): VITD: 32.58 ng/mL (ref 30.00–100.00)

## 2018-01-01 LAB — THYROID PANEL WITH TSH
Free Thyroxine Index: 2.3 (ref 1.4–3.8)
T3 Uptake: 29 % (ref 22–35)
T4, Total: 8 ug/dL (ref 5.1–11.9)
TSH: 2.78 mIU/L (ref 0.40–4.50)

## 2018-01-01 LAB — IBC PANEL
IRON: 84 ug/dL (ref 42–145)
SATURATION RATIOS: 17.7 % — AB (ref 20.0–50.0)
TRANSFERRIN: 339 mg/dL (ref 212.0–360.0)

## 2018-01-01 LAB — FOLATE: FOLATE: 11.6 ng/mL (ref >5.9)

## 2018-01-01 LAB — VITAMIN B12

## 2018-01-01 MED ORDER — ATORVASTATIN CALCIUM 10 MG PO TABS: 10.0000 mg | ORAL_TABLET | Freq: Every day | ORAL | 1 refills | Status: DC

## 2018-01-01 MED ORDER — OLMESARTAN MEDOXOMIL-HCTZ 40-12.5 MG PO TABS: 1.0000 | ORAL_TABLET | Freq: Every day | ORAL | 0 refills | Status: DC

## 2018-01-01 NOTE — Patient Instructions (Signed)

## 2018-01-01 NOTE — Progress Notes (Signed)
Subjective:  Patient ID: Madison Rowe, female    DOB: Oct 21, 1950  Age: 67 y.o. MRN: [id number]  CC: Hypothyroidism; Hyperlipidemia; Anemia; and Hypertension   HPI Madison Rowe presents for concerns about an elevated blood pressure.  She recently saw her eye doctor andl was told that she had a hemorrhage in her left eye.  It has not affected her visual acuity.  She has been seen at other places over the last few months and her blood pressure has been consistently elevated.  She does have a history of high blood pressure.  She denies any recent episodes of headache, blurred vision, chest pain, shortness of breath, edema, or palpitations.   Past Medical History:  Diagnosis Date  . Allergy   . Anemia   . Arthritis   . Osteoporosis 2010  . Vitamin D deficient osteomalacia 2010   Past Surgical History:  Procedure Laterality Date  . BREAST SURGERY    . ECTOPIC PREGNANCY SURGERY    . FOOT SURGERY Left   . TONSILLECTOMY      reports that she has never smoked. She has never used smokeless tobacco. She reports that she drinks alcohol. She reports that she does not use drugs. family history includes Hypertension in her mother; Kidney disease in her father and maternal grandfather. Allergies  Allergen Reactions  . Alendronate Sodium [Alendronate Sodium]     nausea  . Codeine     Outpatient Medications Prior to Visit  Medication Sig Dispense Refill  . Cholecalciferol (VITAMIN D-3 PO) Take by mouth daily.    . clobetasol cream (TEMOVATE) 7.62 % Apply 1 application topically 2 (two) times daily.    Marland Kitchen conjugated estrogens (PREMARIN) vaginal cream Place vaginally as directed. 2-3 times weekly.    . Dietary Management Product (FOSTEUM PLUS) CAPS TAKE ONE CAPSULE BY MOUTH TWICE DAILY 180 capsule 1  . fluticasone (FLONASE) 50 MCG/ACT nasal spray instill 2 sprays into each nostril once daily 16 g 11  . loratadine (CLARITIN) 10 MG tablet Take 10 mg by mouth daily.    Marland Kitchen ibuprofen  (ADVIL,MOTRIN) 200 MG tablet Take 200 mg by mouth every 6 (six) hours as needed.    Marland Kitchen 0.9 %  sodium chloride infusion      No facility-administered medications prior to visit.     ROS Review of Systems  Constitutional: Negative for appetite change, diaphoresis and fatigue.  HENT: Negative.   Eyes: Negative for photophobia, pain and visual disturbance.  Respiratory: Negative for apnea, cough, chest tightness and shortness of breath.   Cardiovascular: Negative.  Negative for chest pain and leg swelling.  Gastrointestinal: Negative for abdominal pain, constipation, diarrhea, nausea and vomiting.  Endocrine: Negative.   Genitourinary: Negative.  Negative for difficulty urinating, dysuria and hematuria.  Musculoskeletal: Negative.  Negative for arthralgias and myalgias.  Skin: Negative.  Negative for color change.  Allergic/Immunologic: Negative.   Neurological: Negative.   Hematological: Negative for adenopathy. Does not bruise/bleed easily.  Psychiatric/Behavioral: Negative.     Objective:  BP (!) 158/88 (BP Location: Left Arm, Patient Position: Sitting, Cuff Size: Large)   Pulse 72   Temp 98 F (36.7 C) (Oral)   Resp 16   Ht 5\' 4"  (1.626 m)   Wt 194 lb 8 oz (88.2 kg)   SpO2 95%   BMI 33.39 kg/m   BP Readings from Last 3 Encounters:  01/01/18 (!) 158/88  04/15/17 126/80  03/05/17 124/78    Wt Readings from Last 3 Encounters:  01/01/18  194 lb 8 oz (88.2 kg)  04/15/17 195 lb (88.5 kg)  03/05/17 193 lb (87.5 kg)    Physical Exam  Constitutional: She is oriented to person, place, and time. No distress.  HENT:  Mouth/Throat: Oropharynx is clear and moist. No oropharyngeal exudate.  Eyes: Conjunctivae are normal. No scleral icterus.  Neck: Normal range of motion. Neck supple. No thyromegaly present.  Cardiovascular: Normal rate, regular rhythm and normal heart sounds. Exam reveals no gallop and no friction rub.  No murmur heard. EKG ---   Sinus  Rhythm  -Right  bundle branch block.   ABNORMAL - no old EKG for comparison   Pulmonary/Chest: Effort normal and breath sounds normal. No stridor. No respiratory distress. She has no wheezes. She has no rales.  Abdominal: Soft. Bowel sounds are normal. She exhibits no mass. There is no tenderness. There is no guarding.  Musculoskeletal: Normal range of motion. She exhibits no edema, tenderness or deformity.  Neurological: She is alert and oriented to person, place, and time.  Skin: Skin is warm and dry. She is not diaphoretic.  Psychiatric: She has a normal mood and affect. Her behavior is normal. Judgment and thought content normal.  Vitals reviewed.   Lab Results  Component Value Date   WBC 7.2 01/01/2018   HGB 13.0 01/01/2018   HCT 39.1 01/01/2018   PLT 195.0 01/01/2018   GLUCOSE 91 01/01/2018   CHOL 207 (H) 01/01/2018   TRIG 63.0 01/01/2018   HDL 73.40 01/01/2018   LDLDIRECT 122.7 09/27/2011   LDLCALC 121 (H) 01/01/2018   ALT 15 01/01/2018   AST 17 01/01/2018   NA 137 01/01/2018   K 4.5 01/01/2018   CL 100 01/01/2018   CREATININE 0.62 01/01/2018   BUN 14 01/01/2018   CO2 31 01/01/2018   TSH 2.78 01/01/2018    Dg Bone Density  Result Date: 10/23/2015 Date of study: 10/17/15 Exam: DUAL X-RAY ABSORPTIOMETRY (DXA) FOR BONE MINERAL DENSITY (BMD) Instrument: [employer] Provider: PCP Indication: follow up for low BMD Comparison: none (please note that it is not possible to compare data from different instruments) Clinical data: Pt is a postmenopausal 67 y.o. female with no previous h/o fracture. On calcium and vitamin D. Results:  Lumbar spine (L1-L4) Femoral neck (FN) 33% distal radius T-score -1.1 RFN:-1.6 LFN:-1.4 n/a Change in BMD from previous DXA test (%) n/a n/a n/a (*) statistically significant Assessment: the BMD is low according to the [facility] classification for osteoporosis (see below). Fracture risk: moderate FRAX score: 10 year major osteoporotic risk: 8.9%. 10 year hip  fracture risk: 1.1%. These are under the thresholds for treatment of 20% and 3%, respectively. Comments: the technical quality of the study is good. Evaluation for secondary causes should be considered if clinically indicated. Recommend optimizing calcium (1200 mg/day) and vitamin D (800 IU/day) intake. Followup: Repeat BMD is appropriate after 2 years or after 1-2 years if starting treatment. WHO criteria for diagnosis of osteoporosis in postmenopausal women and in men 70 y/o or older: - normal: T-score -1.0 to + 1.0 - osteopenia/low bone density: T-score between -2.5 and -1.0 - osteoporosis: T-score below -2.5 - severe osteoporosis: T-score below -2.5 with history of fragility fracture Note: although not part of the WHO classification, the presence of a fragility fracture, regardless of the T-score, should be considered diagnostic of osteoporosis, provided other causes for the fracture have been excluded. Treatment: The National Osteoporosis Foundation recommends that treatment be considered in postmenopausal women and men age  56 or older with: 1. Hip or vertebral (clinical or morphometric) fracture 2. T-score of - 2.5 or lower at the spine or hip 3. 10-year fracture probability by FRAX of at least 20% for a major osteoporotic fracture and 3% for a hip fracture Loura Pardon MD    Assessment & Plan:   Madison Rowe was seen today for hypothyroidism, hyperlipidemia, anemia and hypertension.  Diagnoses and all orders for this visit:  Other specified hypothyroidism- Her TFTs are normal now and she tells me she does not have a history of thyroid disease.  This appears to be an erroneous entry. -     Thyroid Panel With TSH; Future  Vitamin D deficiency- Her vitamin D level is in the normal range.  She will continue the current vitamin D supplement. -     VITAMIN D 25 Hydroxy (Vit-D Deficiency, Fractures); Future  Dyslipidemia, goal LDL below 130- She has an elevated ASCVD risk score so I have asked her to start  taking a statin for CV risk reduction. -     Comprehensive metabolic panel; Future -     Lipid panel; Future -     atorvastatin (LIPITOR) 10 MG tablet; Take 1 tablet (10 mg total) by mouth daily.  Deficiency anemia- Her H&H are normal now.  I will screen her for vitamin deficiencies. -     CBC with Differential/Platelet; Future -     IBC panel; Future -     Vitamin B12; Future -     Folate; Future -     Ferritin; Future -     Vitamin B1; Future  Preventative health care  Hypertensive retinopathy of left eye-we will start treating the hypertension. -     olmesartan-hydrochlorothiazide (BENICAR HCT) 40-12.5 MG tablet; Take 1 tablet by mouth daily.  Essential hypertension- She has developed stage II hypertension with documented endorgan damage.  Her lab work is negative for secondary causes.  Her EKG is negative for LVH or ischemia.  Will start an ARB and thiazide diuretic to treat this. -     EKG 12-Lead -     olmesartan-hydrochlorothiazide (BENICAR HCT) 40-12.5 MG tablet; Take 1 tablet by mouth daily.   I have discontinued Lynnex E. Raybon's ibuprofen. I am also having her start on olmesartan-hydrochlorothiazide and atorvastatin. Additionally, I am having her maintain her conjugated estrogens, Cholecalciferol (VITAMIN D-3 PO), fluticasone, loratadine, clobetasol cream, and FOSTEUM PLUS. We will stop administering sodium chloride.  Meds ordered this encounter  Medications  . olmesartan-hydrochlorothiazide (BENICAR HCT) 40-12.5 MG tablet    Sig: Take 1 tablet by mouth daily.    Dispense:  90 tablet    Refill:  0  . atorvastatin (LIPITOR) 10 MG tablet    Sig: Take 1 tablet (10 mg total) by mouth daily.    Dispense:  90 tablet    Refill:  1   See AVS for instructions about healthy living and anticipatory guidance.  Follow-up: Return in about 2 months (around 03/03/2018).  Scarlette Calico, MD

## 2018-01-02 ENCOUNTER — Encounter: Payer: Self-pay | Admitting: Internal Medicine

## 2018-01-03 ENCOUNTER — Ambulatory Visit: Payer: Self-pay

## 2018-01-03 NOTE — Telephone Encounter (Signed)
Patient called and says "I think I am having a reaction to the new BP medicine Dr. Ronnald Ramp started me on." Olmesartan-HCTZ is the medication. She says "I picked it up last night and took the dose this morning. About 1 hour or so after taking it, I became nauseated, vomited x 1, a little dizzy, body aches, chills, no energy to do anything, and I can't get warm. It was all I could do to get upstairs in the bed to get under more covers to keep warm. I don't even feel like getting in the shower I feel so bad." I asked about swelling, SOB, difficulty breathing, she says "none of those." I advised this would be sent to the office. Advised Dr. Ronnald Ramp is not there today and I would find out if another provider can review and give a recommendation. Advised someone will give her a call back with the recommendation, she verbalized understanding. I called the office and spoke with Sam, [facility], informed of the above and asked if this can be sent to another provider, she verbalized that it will be sent.  Reason for Disposition . Caller has URGENT medication question about med that PCP prescribed and triager unable to answer question  Answer Assessment - Initial Assessment Questions 1. SYMPTOMS: "Do you have any symptoms?"     Nausea, vomiting, body aches, no energy 2. SEVERITY: If symptoms are present, ask "Are they mild, moderate or severe?"    Severe  Protocols used: MEDICATION QUESTION CALL-A-AH

## 2018-01-05 LAB — VITAMIN B1: VITAMIN B1 (THIAMINE): 13 nmol/L (ref 8–30)

## 2018-01-06 NOTE — Telephone Encounter (Signed)
LVM for pt to call back as soon as possible.   RE: is pt still having sx, is pt still taking medication in question.

## 2018-01-14 ENCOUNTER — Telehealth: Payer: Self-pay

## 2018-01-14 NOTE — Telephone Encounter (Signed)
ok 

## 2018-01-14 NOTE — Telephone Encounter (Signed)
Pt contacted and stated that she has not taken the new bp medication since 01/03/2018. Her sx were nausea, vomiting, dizziness and chills. Pt states that her sx have not returned since she stopped the medication.   117/70 on 01/13/2018

## 2018-01-22 NOTE — Telephone Encounter (Signed)
Mychart message sent to patient.

## 2018-02-21 DIAGNOSIS — B9689 Other specified bacterial agents as the cause of diseases classified elsewhere: Secondary | ICD-10-CM | POA: Diagnosis not present

## 2018-02-21 DIAGNOSIS — R05 Cough: Secondary | ICD-10-CM | POA: Diagnosis not present

## 2018-02-21 DIAGNOSIS — J3489 Other specified disorders of nose and nasal sinuses: Secondary | ICD-10-CM | POA: Diagnosis not present

## 2018-02-21 DIAGNOSIS — J0110 Acute frontal sinusitis, unspecified: Secondary | ICD-10-CM | POA: Diagnosis not present

## 2018-02-21 DIAGNOSIS — J208 Acute bronchitis due to other specified organisms: Secondary | ICD-10-CM | POA: Diagnosis not present

## 2018-03-04 ENCOUNTER — Encounter: Payer: Self-pay | Admitting: Internal Medicine

## 2018-03-04 ENCOUNTER — Ambulatory Visit (INDEPENDENT_AMBULATORY_CARE_PROVIDER_SITE_OTHER): Payer: Medicare Other | Admitting: Internal Medicine

## 2018-03-04 VITALS — BP 138/86 | HR 74 | Temp 97.8°F | Resp 16 | Ht 64.0 in | Wt 200.0 lb

## 2018-03-04 DIAGNOSIS — I10 Essential (primary) hypertension: Secondary | ICD-10-CM

## 2018-03-04 MED ORDER — LORCASERIN HCL ER 20 MG PO TB24: 1.0000 | ORAL_TABLET | Freq: Every day | ORAL | 5 refills | Status: DC

## 2018-03-04 NOTE — Progress Notes (Signed)
Subjective:  Patient ID: Madison Rowe, female    DOB: 06/02/1951  Age: 67 y.o. MRN: [id number]  CC: Hypertension   HPI Madison Rowe presents for a BP check - She tried the ARB/thiazide diuretic combination but it caused severe dizziness and lightheaded stiffness so she has not been taking it.  She has been working on her lifestyle modifications and has been pleased with her blood pressure numbers.  She denies any recent episodes of CP, DOE, palpitations, edema, or fatigue.  She wants to try an oral medication to help her lose weight.  Outpatient Medications Prior to Visit  Medication Sig Dispense Refill  . atorvastatin (LIPITOR) 10 MG tablet Take 1 tablet (10 mg total) by mouth daily. 90 tablet 1  . Cholecalciferol (VITAMIN D-3 PO) Take by mouth daily.    . clobetasol cream (TEMOVATE) 0.53 % Apply 1 application topically 2 (two) times daily.    Marland Kitchen conjugated estrogens (PREMARIN) vaginal cream Place vaginally as directed. 2-3 times weekly.    . Dietary Management Product (FOSTEUM PLUS) CAPS TAKE ONE CAPSULE BY MOUTH TWICE DAILY 180 capsule 1  . fluticasone (FLONASE) 50 MCG/ACT nasal spray instill 2 sprays into each nostril once daily 16 g 11  . loratadine (CLARITIN) 10 MG tablet Take 10 mg by mouth daily.    . TURMERIC CURCUMIN PO Take by mouth.    . olmesartan-hydrochlorothiazide (BENICAR HCT) 40-12.5 MG tablet Take 1 tablet by mouth daily. (Patient not taking: Reported on 03/04/2018) 90 tablet 0   No facility-administered medications prior to visit.     ROS Review of Systems  Constitutional: Negative.  Negative for chills, diaphoresis, fatigue and fever.  HENT: Negative.   Eyes: Negative for visual disturbance.  Respiratory: Negative for apnea, cough, shortness of breath and wheezing.   Cardiovascular: Negative for chest pain, palpitations and leg swelling.  Gastrointestinal: Negative for abdominal pain, constipation, diarrhea, nausea and vomiting.  Endocrine: Negative.     Genitourinary: Negative.  Negative for difficulty urinating and dysuria.  Musculoskeletal: Negative.  Negative for back pain, myalgias and neck pain.  Skin: Negative.  Negative for color change, pallor and rash.  Neurological: Negative.  Negative for dizziness, weakness and light-headedness.  Hematological: Negative for adenopathy. Does not bruise/bleed easily.  Psychiatric/Behavioral: Negative.     Objective:  BP 138/86 (BP Location: Left Arm, Patient Position: Sitting, Cuff Size: Large)   Pulse 74   Temp 97.8 F (36.6 C) (Oral)   Resp 16   Ht 5\' 4"  (1.626 m)   Wt 200 lb (90.7 kg)   SpO2 95%   BMI 34.33 kg/m   BP Readings from Last 3 Encounters:  03/04/18 138/86  01/01/18 (!) 158/88  04/15/17 126/80    Wt Readings from Last 3 Encounters:  03/04/18 200 lb (90.7 kg)  01/01/18 194 lb 8 oz (88.2 kg)  04/15/17 195 lb (88.5 kg)    Physical Exam  Constitutional: She is oriented to person, place, and time. No distress.  HENT:  Mouth/Throat: Oropharynx is clear and moist. No oropharyngeal exudate.  Eyes: Conjunctivae are normal. No scleral icterus.  Neck: Normal range of motion. Neck supple. No JVD present. No thyromegaly present.  Cardiovascular: Normal rate, regular rhythm and normal heart sounds. Exam reveals no friction rub.  No murmur heard. Pulmonary/Chest: Effort normal and breath sounds normal. She has no wheezes. She has no rhonchi. She has no rales.  Abdominal: Soft. Normal appearance and bowel sounds are normal. She exhibits no mass. There is  no hepatosplenomegaly. There is no tenderness. No hernia.  Musculoskeletal: Normal range of motion. She exhibits no edema, tenderness or deformity.  Lymphadenopathy:    She has no cervical adenopathy.  Neurological: She is alert and oriented to person, place, and time.  Skin: Skin is warm and dry. She is not diaphoretic. No pallor.  Vitals reviewed.   Lab Results  Component Value Date   WBC 7.2 01/01/2018   HGB 13.0  01/01/2018   HCT 39.1 01/01/2018   PLT 195.0 01/01/2018   GLUCOSE 91 01/01/2018   CHOL 207 (H) 01/01/2018   TRIG 63.0 01/01/2018   HDL 73.40 01/01/2018   LDLDIRECT 122.7 09/27/2011   LDLCALC 121 (H) 01/01/2018   ALT 15 01/01/2018   AST 17 01/01/2018   NA 137 01/01/2018   K 4.5 01/01/2018   CL 100 01/01/2018   CREATININE 0.62 01/01/2018   BUN 14 01/01/2018   CO2 31 01/01/2018   TSH 2.78 01/01/2018    Dg Bone Density  Result Date: 10/23/2015 Date of study: 10/17/15 Exam: DUAL X-RAY ABSORPTIOMETRY (DXA) FOR BONE MINERAL DENSITY (BMD) Instrument: [employer] Provider: PCP Indication: follow up for low BMD Comparison: none (please note that it is not possible to compare data from different instruments) Clinical data: Pt is a postmenopausal 67 y.o. female with no previous h/o fracture. On calcium and vitamin D. Results:  Lumbar spine (L1-L4) Femoral neck (FN) 33% distal radius T-score -1.1 RFN:-1.6 LFN:-1.4 n/a Change in BMD from previous DXA test (%) n/a n/a n/a (*) statistically significant Assessment: the BMD is low according to the [facility] classification for osteoporosis (see below). Fracture risk: moderate FRAX score: 10 year major osteoporotic risk: 8.9%. 10 year hip fracture risk: 1.1%. These are under the thresholds for treatment of 20% and 3%, respectively. Comments: the technical quality of the study is good. Evaluation for secondary causes should be considered if clinically indicated. Recommend optimizing calcium (1200 mg/day) and vitamin D (800 IU/day) intake. Followup: Repeat BMD is appropriate after 2 years or after 1-2 years if starting treatment. WHO criteria for diagnosis of osteoporosis in postmenopausal women and in men 44 y/o or older: - normal: T-score -1.0 to + 1.0 - osteopenia/low bone density: T-score between -2.5 and -1.0 - osteoporosis: T-score below -2.5 - severe osteoporosis: T-score below -2.5 with history of fragility fracture Note: although not part of the  WHO classification, the presence of a fragility fracture, regardless of the T-score, should be considered diagnostic of osteoporosis, provided other causes for the fracture have been excluded. Treatment: The National Osteoporosis Foundation recommends that treatment be considered in postmenopausal women and men age 33 or older with: 1. Hip or vertebral (clinical or morphometric) fracture 2. T-score of - 2.5 or lower at the spine or hip 3. 10-year fracture probability by FRAX of at least 20% for a major osteoporotic fracture and 3% for a hip fracture Madison Pardon MD    Assessment & Plan:   Madison Rowe was seen today for hypertension.  Diagnoses and all orders for this visit:  Essential hypertension- Her blood pressure is well controlled with no medications.  She will continue to work on her lifestyle modifications.  Morbid obesity due to excess calories ([place])- In addition to lifestyle modifications she will try Belviq to help decrease her caloric intake. -     Lorcaserin HCl ER (BELVIQ XR) 20 MG TB24; Take 1 tablet by mouth daily.   I have discontinued Lillia Pauls. Mcquarrie's olmesartan-hydrochlorothiazide. I am also having her  start on Lorcaserin HCl ER. Additionally, I am having her maintain her conjugated estrogens, Cholecalciferol (VITAMIN D-3 PO), fluticasone, loratadine, clobetasol cream, FOSTEUM PLUS, atorvastatin, and TURMERIC CURCUMIN PO.  Meds ordered this encounter  Medications  . Lorcaserin HCl ER (BELVIQ XR) 20 MG TB24    Sig: Take 1 tablet by mouth daily.    Dispense:  30 tablet    Refill:  5     Follow-up: Return in about 6 months (around 09/03/2018).  Madison Calico, MD

## 2018-03-04 NOTE — Patient Instructions (Signed)

## 2018-03-05 ENCOUNTER — Telehealth: Payer: Self-pay | Admitting: Internal Medicine

## 2018-03-05 NOTE — Telephone Encounter (Signed)
Copied from [place] [id number]. Topic: General - Other >> Mar 05, 2018  2:09 PM Lennox Solders wrote: Reason for CRM: Pt is calling and needs a PA for belviq . Please call [phone] PA dept .and insurance number is [phone]. [facility] [address]

## 2018-03-06 NOTE — Telephone Encounter (Signed)
Key: [id number]

## 2018-03-24 LAB — LIPID PANEL
CHOLESTEROL: 137 (ref 0–200)
HDL: 64 (ref 35–70)
LDL CALC: 64
TRIGLYCERIDES: 47 (ref 40–160)

## 2018-03-24 LAB — BASIC METABOLIC PANEL
BUN: 15 (ref 4–21)
Creatinine: 0.6 (ref 0.5–1.1)
Glucose: 94
Potassium: 4.2 (ref 3.4–5.3)
Sodium: 138 (ref 137–147)

## 2018-03-24 LAB — CBC AND DIFFERENTIAL
HCT: 40 (ref 36–46)
HEMOGLOBIN: 13.2 (ref 12.0–16.0)
NEUTROS ABS: 3
PLATELETS: 196 (ref 150–399)
WBC: 6.2

## 2018-03-24 LAB — HEPATIC FUNCTION PANEL
ALK PHOS: 63 (ref 25–125)
ALT: 17 (ref 7–35)
AST: 20 (ref 13–35)
Bilirubin, Total: 0.4

## 2018-04-02 DIAGNOSIS — Z1231 Encounter for screening mammogram for malignant neoplasm of breast: Secondary | ICD-10-CM | POA: Diagnosis not present

## 2018-04-02 LAB — HM MAMMOGRAPHY

## 2018-04-10 ENCOUNTER — Encounter: Payer: Self-pay | Admitting: Internal Medicine

## 2018-04-24 DIAGNOSIS — H5203 Hypermetropia, bilateral: Secondary | ICD-10-CM | POA: Diagnosis not present

## 2018-04-24 DIAGNOSIS — H52223 Regular astigmatism, bilateral: Secondary | ICD-10-CM | POA: Diagnosis not present

## 2018-04-24 DIAGNOSIS — H1850 Unspecified hereditary corneal dystrophies: Secondary | ICD-10-CM | POA: Diagnosis not present

## 2018-04-24 DIAGNOSIS — H3562 Retinal hemorrhage, left eye: Secondary | ICD-10-CM | POA: Diagnosis not present

## 2018-04-24 DIAGNOSIS — H25813 Combined forms of age-related cataract, bilateral: Secondary | ICD-10-CM | POA: Diagnosis not present

## 2018-05-14 DIAGNOSIS — R300 Dysuria: Secondary | ICD-10-CM | POA: Diagnosis not present

## 2018-06-17 DIAGNOSIS — H9011 Conductive hearing loss, unilateral, right ear, with unrestricted hearing on the contralateral side: Secondary | ICD-10-CM | POA: Insufficient documentation

## 2018-06-17 DIAGNOSIS — Z7289 Other problems related to lifestyle: Secondary | ICD-10-CM | POA: Diagnosis not present

## 2018-06-17 DIAGNOSIS — H6121 Impacted cerumen, right ear: Secondary | ICD-10-CM | POA: Diagnosis not present

## 2018-06-17 DIAGNOSIS — J343 Hypertrophy of nasal turbinates: Secondary | ICD-10-CM | POA: Diagnosis not present

## 2018-06-17 DIAGNOSIS — Z9089 Acquired absence of other organs: Secondary | ICD-10-CM | POA: Diagnosis not present

## 2018-07-09 DIAGNOSIS — E785 Hyperlipidemia, unspecified: Secondary | ICD-10-CM

## 2018-07-14 ENCOUNTER — Telehealth: Payer: Self-pay

## 2018-07-14 ENCOUNTER — Ambulatory Visit: Payer: Medicare Other

## 2018-07-14 NOTE — Telephone Encounter (Signed)
Routing to stefannie----patient came in today (nurse visit) for weight check, documentation made on vitals screen, 179 lb---ok to proceed with PA request, see previous mychart message

## 2018-07-15 ENCOUNTER — Telehealth: Payer: Self-pay

## 2018-07-15 MED ORDER — ATORVASTATIN CALCIUM 10 MG PO TABS: 10.0000 mg | ORAL_TABLET | Freq: Every day | ORAL | 0 refills | Status: DC

## 2018-07-15 NOTE — Telephone Encounter (Signed)
Key: [id number]

## 2018-07-15 NOTE — Telephone Encounter (Signed)
error 

## 2018-07-15 NOTE — Telephone Encounter (Signed)
PA has been approved.   Mychart message sent to pt informing of same.  

## 2018-07-16 ENCOUNTER — Encounter: Payer: Self-pay | Admitting: Family

## 2018-07-16 ENCOUNTER — Ambulatory Visit (INDEPENDENT_AMBULATORY_CARE_PROVIDER_SITE_OTHER): Payer: Medicare Other | Admitting: Family

## 2018-07-16 VITALS — BP 138/90 | HR 74 | Temp 98.0°F | Ht 64.0 in | Wt 183.0 lb

## 2018-07-16 DIAGNOSIS — J019 Acute sinusitis, unspecified: Secondary | ICD-10-CM | POA: Diagnosis not present

## 2018-07-16 MED ORDER — CEFDINIR 300 MG PO CAPS: 300.0000 mg | ORAL_CAPSULE | Freq: Two times a day (BID) | ORAL | 0 refills | Status: DC

## 2018-07-16 NOTE — Progress Notes (Signed)
Madison Rowe is a 67 y.o. female with the following history as recorded in EpicCare:  Patient Active Problem List   Diagnosis Date Noted  . Hypertensive retinopathy of left eye 01/01/2018  . Essential hypertension 01/01/2018  . Other specified hypothyroidism 05/26/2015  . Eczema, dyshidrotic 05/26/2015  . Vitamin D deficiency 05/26/2015  . Back pain 11/12/2013  . Osteoporosis 09/27/2011  . Dyslipidemia, goal LDL below 130 09/27/2011  . Preventative health care 08/13/2011  . Morbid obesity due to excess calories ([place]) 03/17/2008  . ALLERGIC RHINITIS 03/17/2008    Current Outpatient Medications  Medication Sig Dispense Refill  . atorvastatin (LIPITOR) 10 MG tablet Take 1 tablet (10 mg total) by mouth daily. 90 tablet 0  . Cholecalciferol (VITAMIN D-3 PO) Take by mouth daily.    . clobetasol cream (TEMOVATE) 2.58 % Apply 1 application topically 2 (two) times daily.    Marland Kitchen conjugated estrogens (PREMARIN) vaginal cream Place vaginally as directed. 2-3 times weekly.    . Dietary Management Product (FOSTEUM PLUS) CAPS TAKE ONE CAPSULE BY MOUTH TWICE DAILY 180 capsule 1  . fluticasone (FLONASE) 50 MCG/ACT nasal spray instill 2 sprays into each nostril once daily 16 g 11  . loratadine (CLARITIN) 10 MG tablet Take 10 mg by mouth daily.    . Lorcaserin HCl ER (BELVIQ XR) 20 MG TB24 Take 1 tablet by mouth daily. 30 tablet 5  . Turmeric Curcumin 500 MG CAPS Take by mouth.    . TURMERIC CURCUMIN PO Take by mouth.    . cefdinir (OMNICEF) 300 MG capsule Take 1 capsule (300 mg total) by mouth 2 (two) times daily. 20 capsule 0   No current facility-administered medications for this visit.     Allergies: Alendronate sodium [alendronate sodium]; Olmesartan; and Codeine  Past Medical History:  Diagnosis Date  . Allergy   . Anemia   . Arthritis   . Osteoporosis 2010  . Vitamin D deficient osteomalacia 2010    Past Surgical History:  Procedure Laterality Date  . BREAST SURGERY    . ECTOPIC  PREGNANCY SURGERY    . FOOT SURGERY Left   . TONSILLECTOMY      Family History  Problem Relation Age of Onset  . Kidney disease Father   . Kidney disease Maternal Grandfather   . Hypertension Mother   . Cancer Neg Hx   . Heart disease Neg Hx   . Colon cancer Neg Hx   . Esophageal cancer Neg Hx   . Rectal cancer Neg Hx   . Stomach cancer Neg Hx     Social History   Tobacco Use  . Smoking status: Never Smoker  . Smokeless tobacco: Never Used  Substance Use Topics  . Alcohol use: Yes    Comment: Occasional    Subjective:  Patient presents with concerns for sinus infection; no chest pain, shortness of breath; + sore throat; no fever; using Flonase and Claritin;    Objective:  Vitals:   07/16/18 1330  BP: 138/90  Pulse: 74  Temp: 98 F (36.7 C)  TempSrc: Oral  SpO2: 97%  Weight: 183 lb (83 kg)  Height: 5\' 4"  (1.626 m)    General: Well developed, well nourished, in no acute distress  Skin : Warm and dry.  Head: Normocephalic and atraumatic  Eyes: Sclera and conjunctiva clear; pupils round and reactive to light; extraocular movements intact  Ears: External normal; canals clear; tympanic membranes normal  Oropharynx: Pink, supple. No suspicious lesions  Neck: Supple without  thyromegaly, adenopathy  Lungs: Respirations unlabored; clear to auscultation bilaterally without wheeze, rales, rhonchi  CVS exam: normal rate and regular rhythm.  Neurologic: Alert and oriented; speech intact; face symmetrical; moves all extremities well; CNII-XII intact without focal deficit   Assessment:  1. Acute sinusitis, recurrence not specified, unspecified location     Plan:  Rx for Omnicef 300 mg bid x 10 days; continue Claritin and Flonase; increase fluids, rest and follow- up worse, no better.   No follow-ups on file.  No orders of the defined types were placed in this encounter.   Requested Prescriptions   Signed Prescriptions Disp Refills  . cefdinir (OMNICEF) 300 MG capsule  20 capsule 0    Sig: Take 1 capsule (300 mg total) by mouth 2 (two) times daily.

## 2018-07-25 DIAGNOSIS — Z6831 Body mass index (BMI) 31.0-31.9, adult: Secondary | ICD-10-CM | POA: Diagnosis not present

## 2018-07-25 DIAGNOSIS — Z1389 Encounter for screening for other disorder: Secondary | ICD-10-CM | POA: Diagnosis not present

## 2018-07-25 DIAGNOSIS — Z01419 Encounter for gynecological examination (general) (routine) without abnormal findings: Secondary | ICD-10-CM | POA: Diagnosis not present

## 2018-07-25 LAB — HM PAP SMEAR

## 2018-07-31 ENCOUNTER — Encounter: Payer: Self-pay | Admitting: Internal Medicine

## 2018-07-31 NOTE — Progress Notes (Signed)
Outside notes received. Information abstracted. Notes sent to scan.  

## 2018-08-18 ENCOUNTER — Other Ambulatory Visit: Payer: Self-pay | Admitting: Internal Medicine

## 2018-08-18 DIAGNOSIS — M810 Age-related osteoporosis without current pathological fracture: Secondary | ICD-10-CM

## 2018-08-25 ENCOUNTER — Ambulatory Visit (INDEPENDENT_AMBULATORY_CARE_PROVIDER_SITE_OTHER): Payer: Medicare Other | Admitting: Internal Medicine

## 2018-08-25 ENCOUNTER — Encounter: Payer: Self-pay | Admitting: Internal Medicine

## 2018-08-25 VITALS — BP 124/80 | HR 63 | Temp 97.6°F | Resp 16 | Ht 64.0 in | Wt 182.8 lb

## 2018-08-25 DIAGNOSIS — J301 Allergic rhinitis due to pollen: Secondary | ICD-10-CM | POA: Diagnosis not present

## 2018-08-25 MED ORDER — FLUTICASONE PROPIONATE 50 MCG/ACT NA SUSP: 2.0000 | Freq: Every day | NASAL | 1 refills | Status: DC

## 2018-08-25 MED ORDER — LORCASERIN HCL ER 20 MG PO TB24: 1.0000 | ORAL_TABLET | Freq: Every day | ORAL | 5 refills | Status: DC

## 2018-08-25 MED ORDER — LEVOCETIRIZINE DIHYDROCHLORIDE 5 MG PO TABS: 5.0000 mg | ORAL_TABLET | Freq: Every evening | ORAL | 1 refills | Status: DC

## 2018-08-25 NOTE — Progress Notes (Signed)
Subjective:  Patient ID: Madison Rowe, female    DOB: May 21, 1951  Age: 67 y.o. MRN: [id number]  CC: Allergic Rhinitis    HPI Madison Rowe presents for f/up - She is doing well on Belviq.  She reports she is losing weight.  She is tolerating it well and she wants to continue taking it.  She complains of a several month history of postnasal drip and chronic throat clearing.  She is not getting much symptom relief with Claritin.  Outpatient Medications Prior to Visit  Medication Sig Dispense Refill  . atorvastatin (LIPITOR) 10 MG tablet Take 1 tablet (10 mg total) by mouth daily. 90 tablet 0  . Cholecalciferol (VITAMIN D-3 PO) Take by mouth daily.    . clobetasol cream (TEMOVATE) 4.16 % Apply 1 application topically 2 (two) times daily.    Marland Kitchen conjugated estrogens (PREMARIN) vaginal cream Place vaginally as directed. 2-3 times weekly.    . Dietary Management Product (FOSTEUM PLUS) CAPS TAKE ONE CAPSULE BY MOUTH TWICE DAILY 180 capsule 1  . Turmeric Curcumin 500 MG CAPS Take by mouth.    . fluticasone (FLONASE) 50 MCG/ACT nasal spray instill 2 sprays into each nostril once daily 16 g 11  . loratadine (CLARITIN) 10 MG tablet Take 10 mg by mouth daily.    . Lorcaserin HCl ER (BELVIQ XR) 20 MG TB24 Take 1 tablet by mouth daily. 30 tablet 5  . cefdinir (OMNICEF) 300 MG capsule Take 1 capsule (300 mg total) by mouth 2 (two) times daily. 20 capsule 0  . TURMERIC CURCUMIN PO Take by mouth.     No facility-administered medications prior to visit.     ROS Review of Systems  Constitutional: Negative.  Negative for diaphoresis and unexpected weight change.  HENT: Positive for congestion, postnasal drip and rhinorrhea. Negative for nosebleeds, sinus pressure, sinus pain, sneezing, sore throat and trouble swallowing.   Eyes: Negative for visual disturbance.  Respiratory: Negative.  Negative for cough, chest tightness, shortness of breath and wheezing.   Cardiovascular: Negative for chest pain,  palpitations and leg swelling.  Gastrointestinal: Negative for abdominal pain, constipation, diarrhea, nausea and vomiting.  Endocrine: Negative.   Genitourinary: Negative.  Negative for difficulty urinating.  Musculoskeletal: Negative.  Negative for arthralgias.  Skin: Negative.   Neurological: Negative.  Negative for dizziness, weakness and light-headedness.  Hematological: Negative for adenopathy. Does not bruise/bleed easily.  Psychiatric/Behavioral: Negative.     Objective:  BP 124/80 (BP Location: Left Arm, Patient Position: Sitting, Cuff Size: Normal)   Pulse 63   Temp 97.6 F (36.4 C) (Oral)   Resp 16   Ht 5\' 4"  (1.626 m)   Wt 182 lb 12 oz (82.9 kg)   SpO2 96%   BMI 31.37 kg/m   BP Readings from Last 3 Encounters:  08/25/18 124/80  07/16/18 138/90  03/04/18 138/86    Wt Readings from Last 3 Encounters:  08/25/18 182 lb 12 oz (82.9 kg)  07/16/18 183 lb (83 kg)  07/14/18 179 lb (81.2 kg)    Physical Exam Vitals signs reviewed.  Constitutional:      General: She is not in acute distress.    Appearance: She is not ill-appearing, toxic-appearing or diaphoretic.  HENT:     Nose: Mucosal edema and rhinorrhea present. No nasal deformity, septal deviation, signs of injury, nasal tenderness or congestion.     Right Nostril: No epistaxis.     Left Nostril: No epistaxis.  Neck:     Musculoskeletal: Normal  range of motion and neck supple. No neck rigidity or muscular tenderness.  Cardiovascular:     Rate and Rhythm: Normal rate and regular rhythm.     Pulses: Normal pulses.     Heart sounds: Normal heart sounds. No murmur. No friction rub. No gallop.   Pulmonary:     Effort: Pulmonary effort is normal.     Breath sounds: Normal breath sounds. No stridor. No wheezing or rales.  Abdominal:     General: Abdomen is flat. Bowel sounds are normal. There is no distension.     Palpations: Abdomen is soft. There is no hepatomegaly, splenomegaly or mass.     Tenderness: There  is no abdominal tenderness.  Musculoskeletal: Normal range of motion.        General: No swelling, tenderness or deformity.  Skin:    General: Skin is warm and dry.     Coloration: Skin is not pale.     Findings: No rash.  Neurological:     General: No focal deficit present.     Mental Status: She is oriented to person, place, and time. Mental status is at baseline.  Psychiatric:        Mood and Affect: Mood normal.        Behavior: Behavior normal.        Thought Content: Thought content normal.        Judgment: Judgment normal.     Lab Results  Component Value Date   WBC 6.2 03/24/2018   HGB 13.2 03/24/2018   HCT 40 03/24/2018   PLT 196 03/24/2018   GLUCOSE 91 01/01/2018   CHOL 137 03/24/2018   TRIG 47 03/24/2018   HDL 64 03/24/2018   LDLDIRECT 122.7 09/27/2011   LDLCALC 64 03/24/2018   ALT 17 03/24/2018   AST 20 03/24/2018   NA 138 03/24/2018   K 4.2 03/24/2018   CL 100 01/01/2018   CREATININE 0.6 03/24/2018   BUN 15 03/24/2018   CO2 31 01/01/2018   TSH 2.78 01/01/2018    Dg Bone Density  Result Date: 10/23/2015 Date of study: 10/17/15 Exam: DUAL X-RAY ABSORPTIOMETRY (DXA) FOR BONE MINERAL DENSITY (BMD) Instrument: [employer] [facility] Provider: PCP Indication: follow up for low BMD Comparison: none (please note that it is not possible to compare data from different instruments) Clinical data: Pt is a postmenopausal 67 y.o. female with no previous h/o fracture. On calcium and vitamin D. Results:  Lumbar spine (L1-L4) Femoral neck (FN) 33% distal radius T-score -1.1 RFN:-1.6 LFN:-1.4 n/a Change in BMD from previous DXA test (%) n/a n/a n/a (*) statistically significant Assessment: the BMD is low according to the [facility] classification for osteoporosis (see below). Fracture risk: moderate FRAX score: 10 year major osteoporotic risk: 8.9%. 10 year hip fracture risk: 1.1%. These are under the thresholds for treatment of 20% and 3%, respectively. Comments: the technical  quality of the study is good. Evaluation for secondary causes should be considered if clinically indicated. Recommend optimizing calcium (1200 mg/day) and vitamin D (800 IU/day) intake. Followup: Repeat BMD is appropriate after 2 years or after 1-2 years if starting treatment. WHO criteria for diagnosis of osteoporosis in postmenopausal women and in men 4 y/o or older: - normal: T-score -1.0 to + 1.0 - osteopenia/low bone density: T-score between -2.5 and -1.0 - osteoporosis: T-score below -2.5 - severe osteoporosis: T-score below -2.5 with history of fragility fracture Note: although not part of the WHO classification, the presence of a fragility fracture, regardless  of the T-score, should be considered diagnostic of osteoporosis, provided other causes for the fracture have been excluded. Treatment: The National Osteoporosis Foundation recommends that treatment be considered in postmenopausal women and men age 39 or older with: 1. Hip or vertebral (clinical or morphometric) fracture 2. T-score of - 2.5 or lower at the spine or hip 3. 10-year fracture probability by FRAX of at least 20% for a major osteoporotic fracture and 3% for a hip fracture Loura Pardon MD    Assessment & Plan:   Kinnedy was seen today for allergic rhinitis .  Diagnoses and all orders for this visit:  Seasonal allergic rhinitis due to pollen- I will upgrade her to a more potent antihistamine and I have asked her to continue using Flonase nasal spray. -     levocetirizine (XYZAL) 5 MG tablet; Take 1 tablet (5 mg total) by mouth every evening. -     fluticasone (FLONASE) 50 MCG/ACT nasal spray; Place 2 sprays into both nostrils daily.  Morbid obesity due to excess calories ([place]) - Will continue Belviq. -     Lorcaserin HCl ER (BELVIQ XR) 20 MG TB24; Take 1 tablet by mouth daily.   I have discontinued Anelly E. Agerton's fluticasone, loratadine, TURMERIC CURCUMIN PO, and cefdinir. I am also having her start on levocetirizine and  fluticasone. Additionally, I am having her maintain her conjugated estrogens, Cholecalciferol (VITAMIN D-3 PO), clobetasol cream, atorvastatin, Turmeric Curcumin, FOSTEUM PLUS, and Lorcaserin HCl ER.  Meds ordered this encounter  Medications  . levocetirizine (XYZAL) 5 MG tablet    Sig: Take 1 tablet (5 mg total) by mouth every evening.    Dispense:  90 tablet    Refill:  1  . fluticasone (FLONASE) 50 MCG/ACT nasal spray    Sig: Place 2 sprays into both nostrils daily.    Dispense:  48 g    Refill:  1  . Lorcaserin HCl ER (BELVIQ XR) 20 MG TB24    Sig: Take 1 tablet by mouth daily.    Dispense:  30 tablet    Refill:  5     Follow-up: No follow-ups on file.  Scarlette Calico, MD

## 2018-08-25 NOTE — Patient Instructions (Signed)

## 2018-09-11 ENCOUNTER — Other Ambulatory Visit: Payer: Self-pay | Admitting: Internal Medicine

## 2018-10-16 ENCOUNTER — Other Ambulatory Visit: Payer: Self-pay | Admitting: Internal Medicine

## 2018-10-16 DIAGNOSIS — E785 Hyperlipidemia, unspecified: Secondary | ICD-10-CM

## 2018-10-24 DIAGNOSIS — T1512XA Foreign body in conjunctival sac, left eye, initial encounter: Secondary | ICD-10-CM | POA: Diagnosis not present

## 2018-11-28 ENCOUNTER — Encounter: Payer: Self-pay | Admitting: Internal Medicine

## 2018-12-02 ENCOUNTER — Telehealth: Payer: Self-pay | Admitting: Internal Medicine

## 2018-12-02 NOTE — Telephone Encounter (Signed)
Copied from [place] [id number]. Topic: General - Other >> Dec 02, 2018 11:05 AM Lennox Solders wrote: Reason for CRM: [facility] [address] left a message on refill voice mail. The belviq xr 20 mg has been recalled and would like to know what med the md would like to change pt to.

## 2018-12-02 NOTE — Telephone Encounter (Signed)
Duplicate message. Pt has already sent a mychart message and that message has been sent to pcp.   Closing encounter.

## 2019-03-09 ENCOUNTER — Ambulatory Visit (INDEPENDENT_AMBULATORY_CARE_PROVIDER_SITE_OTHER): Payer: Medicare Other | Admitting: Internal Medicine

## 2019-03-09 ENCOUNTER — Encounter: Payer: Self-pay | Admitting: Internal Medicine

## 2019-03-09 ENCOUNTER — Other Ambulatory Visit: Payer: Self-pay

## 2019-03-09 ENCOUNTER — Ambulatory Visit (INDEPENDENT_AMBULATORY_CARE_PROVIDER_SITE_OTHER)
Admission: RE | Admit: 2019-03-09 | Discharge: 2019-03-09 | Disposition: A | Discharge status: Home / Self Care | Payer: Medicare Other | Source: Ambulatory Visit | Attending: Internal Medicine | Admitting: Internal Medicine

## 2019-03-09 ENCOUNTER — Other Ambulatory Visit (INDEPENDENT_AMBULATORY_CARE_PROVIDER_SITE_OTHER): Payer: Medicare Other

## 2019-03-09 VITALS — BP 126/78 | HR 68 | Temp 97.8°F | Resp 16 | Ht 64.0 in | Wt 189.0 lb

## 2019-03-09 DIAGNOSIS — S46912A Strain of unspecified muscle, fascia and tendon at shoulder and upper arm level, left arm, initial encounter: Secondary | ICD-10-CM

## 2019-03-09 DIAGNOSIS — E559 Vitamin D deficiency, unspecified: Secondary | ICD-10-CM

## 2019-03-09 DIAGNOSIS — I10 Essential (primary) hypertension: Secondary | ICD-10-CM

## 2019-03-09 DIAGNOSIS — Z1159 Encounter for screening for other viral diseases: Secondary | ICD-10-CM | POA: Diagnosis not present

## 2019-03-09 DIAGNOSIS — S4992XA Unspecified injury of left shoulder and upper arm, initial encounter: Secondary | ICD-10-CM | POA: Diagnosis not present

## 2019-03-09 DIAGNOSIS — J301 Allergic rhinitis due to pollen: Secondary | ICD-10-CM

## 2019-03-09 DIAGNOSIS — M79602 Pain in left arm: Secondary | ICD-10-CM | POA: Diagnosis not present

## 2019-03-09 DIAGNOSIS — E785 Hyperlipidemia, unspecified: Secondary | ICD-10-CM

## 2019-03-09 DIAGNOSIS — Z91038 Other insect allergy status: Secondary | ICD-10-CM | POA: Insufficient documentation

## 2019-03-09 LAB — CBC WITH DIFFERENTIAL/PLATELET
Basophils Absolute: 0 10*3/uL (ref 0.0–0.1)
Basophils Relative: 0.6 % (ref 0.0–3.0)
Eosinophils Absolute: 0.1 10*3/uL (ref 0.0–0.7)
Eosinophils Relative: 1 % (ref 0.0–5.0)
HCT: 40 % (ref 36.0–46.0)
Hemoglobin: 13.2 g/dL (ref 12.0–15.0)
Lymphocytes Relative: 26.2 % (ref 12.0–46.0)
Lymphs Abs: 2 10*3/uL (ref 0.7–4.0)
MCHC: 32.9 g/dL (ref 30.0–36.0)
MCV: 89.8 fl (ref 78.0–100.0)
Monocytes Absolute: 0.6 10*3/uL (ref 0.1–1.0)
Monocytes Relative: 7.5 % (ref 3.0–12.0)
Neutro Abs: 4.9 10*3/uL (ref 1.4–7.7)
Neutrophils Relative %: 64.7 % (ref 43.0–77.0)
Platelets: 213 10*3/uL (ref 150.0–400.0)
RBC: 4.45 Mil/uL (ref 3.87–5.11)
RDW: 14.6 % (ref 11.5–15.5)
WBC: 7.5 10*3/uL (ref 4.0–10.5)

## 2019-03-09 LAB — URINALYSIS, ROUTINE W REFLEX MICROSCOPIC
Bilirubin Urine: NEGATIVE
Hgb urine dipstick: NEGATIVE
Ketones, ur: NEGATIVE
Leukocytes,Ua: NEGATIVE
Nitrite: NEGATIVE
Specific Gravity, Urine: 1.025 (ref 1.000–1.030)
Total Protein, Urine: NEGATIVE
Urine Glucose: NEGATIVE
Urobilinogen, UA: 0.2 (ref 0.0–1.0)
pH: 5.5 (ref 5.0–8.0)

## 2019-03-09 LAB — COMPREHENSIVE METABOLIC PANEL
ALT: 16 U/L (ref 0–35)
AST: 17 U/L (ref 0–37)
Albumin: 4.5 g/dL (ref 3.5–5.2)
Alkaline Phosphatase: 59 U/L (ref 39–117)
BUN: 16 mg/dL (ref 6–23)
CO2: 27 mEq/L (ref 19–32)
Calcium: 9.8 mg/dL (ref 8.4–10.5)
Chloride: 101 mEq/L (ref 96–112)
Creatinine, Ser: 0.7 mg/dL (ref 0.40–1.20)
GFR: 82.65 mL/min (ref >60.00)
Glucose, Bld: 83 mg/dL (ref 70–99)
Potassium: 4.6 mEq/L (ref 3.5–5.1)
Sodium: 138 mEq/L (ref 135–145)
Total Bilirubin: 0.5 mg/dL (ref 0.2–1.2)
Total Protein: 7.3 g/dL (ref 6.0–8.3)

## 2019-03-09 LAB — LIPID PANEL
Cholesterol: 160 mg/dL (ref 0–200)
HDL: 77.8 mg/dL (ref >39.00)
LDL Cholesterol: 73 mg/dL (ref 0–99)
NonHDL: 82.54
Total CHOL/HDL Ratio: 2
Triglycerides: 49 mg/dL (ref 0.0–149.0)
VLDL: 9.8 mg/dL (ref 0.0–40.0)

## 2019-03-09 LAB — TSH: TSH: 1.96 u[IU]/mL (ref 0.35–4.50)

## 2019-03-09 LAB — VITAMIN D 25 HYDROXY (VIT D DEFICIENCY, FRACTURES): VITD: 42.31 ng/mL (ref 30.00–100.00)

## 2019-03-09 MED ORDER — MELOXICAM 15 MG PO TABS: 15.0000 mg | ORAL_TABLET | Freq: Every day | ORAL | 1 refills | Status: DC

## 2019-03-09 MED ORDER — LEVOCETIRIZINE DIHYDROCHLORIDE 5 MG PO TABS: 5.0000 mg | ORAL_TABLET | Freq: Every evening | ORAL | 1 refills | Status: DC

## 2019-03-09 MED ORDER — CLOBETASOL PROPIONATE 0.05 % EX CREA: 1.0000 "application " | TOPICAL_CREAM | Freq: Two times a day (BID) | CUTANEOUS | 1 refills | Status: DC

## 2019-03-09 NOTE — Patient Instructions (Signed)
Muscle Strain  A muscle strain is an injury that occurs when a muscle is stretched beyond its normal length. Usually, a small number of muscle fibers are torn when this happens. There are three types of muscle strains. First-degree strains have the least amount of muscle fiber tearing and the least amount of pain. Second-degree and third-degree strains have more tearing and pain.  Usually, recovery from muscle strain takes 1-2 weeks. Complete healing normally takes 5-6 weeks.  What are the causes?  This condition is caused when a sudden, violent force is placed on a muscle and stretches it too far. This may occur with a fall, lifting, or sports.  What increases the risk?  This condition is more likely to develop in athletes and people who are physically active.  What are the signs or symptoms?  Symptoms of this condition include:  · Pain.  · Bruising.  · Swelling.  · Trouble using the muscle.  How is this diagnosed?  This condition is diagnosed based on a physical exam and your medical history. Tests may also be done, including an X-ray, ultrasound, or MRI.  How is this treated?  This condition is initially treated with PRICE therapy. This therapy involves:  · Protecting the muscle from being injured again.  · Resting the injured muscle.  · Icing the injured muscle.  · Applying pressure (compression) to the injured muscle. This may be done with a splint or elastic bandage.  · Raising (elevating) the injured muscle.  Your health care provider may also recommend medicine for pain.  Follow these instructions at home:  If you have a splint:  · Wear the splint as told by your health care provider. Remove it only as told by your health care provider.  · Loosen the splint if your fingers or toes tingle, become numb, or turn cold and blue.  · Keep the splint clean.  · If the splint is not waterproof:  ? Do not let it get wet.  ? Cover it with a watertight covering when you take a bath or a shower.  Managing pain, stiffness,  and swelling    · If directed, put ice on the injured area.  ? If you have a removable splint, remove it as told by your health care provider.  ? Put ice in a plastic bag.  ? Place a towel between your skin and the bag.  ? Leave the ice on for 20 minutes, 2-3 times a day.  · Move your fingers or toes often to avoid stiffness and to lessen swelling.  · Raise (elevate) the injured area above the level of your heart while you are sitting or lying down.  · Wear an elastic bandage as told by your health care provider. Make sure that it is not too tight.  General instructions  · Take over-the-counter and prescription medicines only as told by your health care provider.  · Restrict your activity and rest the injured muscle as told by your health care provider. Gentle movements may be allowed.  · If physical therapy was prescribed, do exercises as told by your health care provider.  · Do not put pressure on any part of the splint until it is fully hardened. This may take several hours.  · Do not use any products that contain nicotine or tobacco, such as cigarettes and e-cigarettes. These can delay bone healing. If you need help quitting, ask your health care provider.  · Ask your health care provider when it   You have more pain or swelling in the injured area. Get help right away if:  You have numbness or tingling or lose a lot of strength in the injured area. Summary  A muscle strain is an injury that occurs when a muscle is stretched beyond its normal length.  This condition is caused when a sudden, violent force is placed on a muscle and stretches it too far.  This condition is initially treated with PRICE therapy, which involves protecting, resting,  icing, compressing, and elevating.  Gentle movements may be allowed. If physical therapy was prescribed, do exercises as told by your health care provider. This information is not intended to replace advice given to you by your health care provider. Make sure you discuss any questions you have with your health care provider. Document Released: 08/27/2005 Document Revised: 08/09/2017 Document Reviewed: 10/03/2016 Elsevier Patient Education  2020 [employer].

## 2019-03-09 NOTE — Progress Notes (Signed)
Subjective:  Patient ID: Madison Rowe, female    DOB: Jan 27, 1951  Age: 68 y.o. MRN: [id number]  CC: Hypertension and Hyperlipidemia   HPI Madison Rowe presents for f/up -  1.  She complains of a several week history of itchy insect bites on her right mid arm.  She has not gotten much symptom relief with Benadryl and over-the-counter hydrocortisone.  2.  She complains of a several week history of discomfort in her left upper arm.  She has not gotten much symptom relief with over-the-counter doses of Advil.  She thinks she injured it when she was trying to lift and move some furniture.  3.  She tells me her blood pressure has been well controlled with lifestyle modifications.  4.  She is doing well on the statin and denies myalgias.  Outpatient Medications Prior to Visit  Medication Sig Dispense Refill   atorvastatin (LIPITOR) 10 MG tablet TAKE 1 TABLET(10 MG) BY MOUTH DAILY 90 tablet 1   Cholecalciferol (VITAMIN D-3 PO) Take by mouth daily.     conjugated estrogens (PREMARIN) vaginal cream Place vaginally as directed. 2-3 times weekly.     Dietary Management Product (FOSTEUM PLUS) CAPS TAKE ONE CAPSULE BY MOUTH TWICE DAILY 180 capsule 1   fluticasone (FLONASE) 50 MCG/ACT nasal spray Place 2 sprays into both nostrils daily. 48 g 1   Turmeric Curcumin 500 MG CAPS Take by mouth.     clobetasol cream (TEMOVATE) 2.24 % Apply 1 application topically 2 (two) times daily.     BELVIQ XR 20 MG TB24 TAKE 1 TABLET BY MOUTH DAILY 30 tablet 5   levocetirizine (XYZAL) 5 MG tablet Take 1 tablet (5 mg total) by mouth every evening. 90 tablet 1   No facility-administered medications prior to visit.     ROS Review of Systems  Constitutional: Positive for unexpected weight change (wt gain). Negative for appetite change, chills, diaphoresis and fatigue.  HENT: Negative.   Eyes: Negative.   Respiratory: Negative for cough, chest tightness, shortness of breath and wheezing.     Gastrointestinal: Negative for abdominal pain, constipation, diarrhea, nausea and vomiting.  Genitourinary: Negative.  Negative for difficulty urinating, dysuria and hematuria.  Musculoskeletal: Positive for arthralgias. Negative for joint swelling and myalgias.  Skin: Positive for rash.  Neurological: Negative.  Negative for dizziness, weakness, light-headedness and headaches.  Hematological: Negative for adenopathy. Does not bruise/bleed easily.  Psychiatric/Behavioral: Negative.     Objective:  BP 126/78    Pulse 68    Temp 97.8 F (36.6 C) (Oral)    Resp 16    Ht 5\' 4"  (1.626 m)    Wt 189 lb (85.7 kg)    SpO2 97%    BMI 32.44 kg/m   BP Readings from Last 3 Encounters:  03/09/19 126/78  08/25/18 124/80  07/16/18 138/90    Wt Readings from Last 3 Encounters:  03/09/19 189 lb (85.7 kg)  08/25/18 182 lb 12 oz (82.9 kg)  07/16/18 183 lb (83 kg)    Physical Exam Vitals signs reviewed.  Constitutional:      Appearance: She is obese. She is not ill-appearing or diaphoretic.  HENT:     Nose: Nose normal.     Mouth/Throat:     Mouth: Mucous membranes are moist.     Pharynx: No oropharyngeal exudate.  Eyes:     General: No scleral icterus.    Conjunctiva/sclera: Conjunctivae normal.  Neck:     Musculoskeletal: Normal range of motion. No  neck rigidity or muscular tenderness.  Cardiovascular:     Rate and Rhythm: Normal rate and regular rhythm.     Heart sounds: No murmur. No gallop.   Pulmonary:     Effort: Pulmonary effort is normal.     Breath sounds: No stridor. No wheezing, rhonchi or rales.  Abdominal:     General: Abdomen is protuberant. Bowel sounds are normal. There is no distension.     Palpations: There is no hepatomegaly or splenomegaly.     Tenderness: There is no abdominal tenderness.     Hernia: No hernia is present.  Musculoskeletal: Normal range of motion.     Left elbow: Normal. She exhibits normal range of motion, no swelling and no deformity. No  tenderness found.  Lymphadenopathy:     Cervical: No cervical adenopathy.  Skin:    General: Skin is warm and dry.     Findings: Abrasion and rash present. No abscess, bruising or lesion. Rash is papular and scaling. Rash is not macular, purpuric, pustular, urticarial or vesicular.     Comments: Scattered above and below the right elbow there are about 3 or 4 very faint lesions with scaling and peeling.  They are slightly excoriated.  There are no vesicles, pustules, induration, erythema, or streaking.  Neurological:     General: No focal deficit present.     Mental Status: She is oriented to person, place, and time. Mental status is at baseline.  Psychiatric:        Mood and Affect: Mood normal.        Behavior: Behavior normal.     Lab Results  Component Value Date   WBC 7.5 03/09/2019   HGB 13.2 03/09/2019   HCT 40.0 03/09/2019   PLT 213.0 03/09/2019   GLUCOSE 83 03/09/2019   CHOL 160 03/09/2019   TRIG 49.0 03/09/2019   HDL 77.80 03/09/2019   LDLDIRECT 122.7 09/27/2011   LDLCALC 73 03/09/2019   ALT 16 03/09/2019   AST 17 03/09/2019   NA 138 03/09/2019   K 4.6 03/09/2019   CL 101 03/09/2019   CREATININE 0.70 03/09/2019   BUN 16 03/09/2019   CO2 27 03/09/2019   TSH 1.96 03/09/2019    Dg Bone Density  Result Date: 10/23/2015 Date of study: 10/17/15 Exam: DUAL X-RAY ABSORPTIOMETRY (DXA) FOR BONE MINERAL DENSITY (BMD) Instrument: [employer] [facility] Provider: PCP Indication: follow up for low BMD Comparison: none (please note that it is not possible to compare data from different instruments) Clinical data: Pt is a postmenopausal 68 y.o. female with no previous h/o fracture. On calcium and vitamin D. Results:  Lumbar spine (L1-L4) Femoral neck (FN) 33% distal radius T-score -1.1 RFN:-1.6 LFN:-1.4 n/a Change in BMD from previous DXA test (%) n/a n/a n/a (*) statistically significant Assessment: the BMD is low according to the [facility] classification for osteoporosis (see  below). Fracture risk: moderate FRAX score: 10 year major osteoporotic risk: 8.9%. 10 year hip fracture risk: 1.1%. These are under the thresholds for treatment of 20% and 3%, respectively. Comments: the technical quality of the study is good. Evaluation for secondary causes should be considered if clinically indicated. Recommend optimizing calcium (1200 mg/day) and vitamin D (800 IU/day) intake. Followup: Repeat BMD is appropriate after 2 years or after 1-2 years if starting treatment. WHO criteria for diagnosis of osteoporosis in postmenopausal women and in men 52 y/o or older: - normal: T-score -1.0 to + 1.0 - osteopenia/low bone density: T-score between -2.5 and -1.0 -  osteoporosis: T-score below -2.5 - severe osteoporosis: T-score below -2.5 with history of fragility fracture Note: although not part of the WHO classification, the presence of a fragility fracture, regardless of the T-score, should be considered diagnostic of osteoporosis, provided other causes for the fracture have been excluded. Treatment: The National Osteoporosis Foundation recommends that treatment be considered in postmenopausal women and men age 56 or older with: 1. Hip or vertebral (clinical or morphometric) fracture 2. T-score of - 2.5 or lower at the spine or hip 3. 10-year fracture probability by FRAX of at least 20% for a major osteoporotic fracture and 3% for a hip fracture Loura Pardon MD    Assessment & Plan:   Denys was seen today for hypertension and hyperlipidemia.  Diagnoses and all orders for this visit:  Essential hypertension- Her lood pressure is adequately well controlled with lifestyle modifications.  Medical therapy is not indicated. -     CBC with Differential/Platelet; Future -     Comprehensive metabolic panel; Future -     Urinalysis, Routine w reflex microscopic; Future  Dyslipidemia, goal LDL below 130- She has achieved her LDL goal and is doing well on the statin. -     Lipid panel; Future -      Comprehensive metabolic panel; Future -     TSH; Future  Vitamin D deficiency -     VITAMIN D 25 Hydroxy (Vit-D Deficiency, Fractures); Future  Need for hepatitis C screening test -     Hepatitis C antibody; Future  Allergic reaction to insect bite -     clobetasol cream (TEMOVATE) 0.05 %; Apply 1 application topically 2 (two) times daily. -     levocetirizine (XYZAL) 5 MG tablet; Take 1 tablet (5 mg total) by mouth every evening.  Left upper arm injury, initial encounter- Exam, and normal plain films are consistent with musculoskeletal strain.  I have asked her to try a once daily anti-inflammatory to treat this. -     DG Humerus Left; Future  Muscle strain of left upper arm, initial encounter -     DG Humerus Left; Future -     meloxicam (MOBIC) 15 MG tablet; Take 1 tablet (15 mg total) by mouth daily.  Seasonal allergic rhinitis due to pollen -     levocetirizine (XYZAL) 5 MG tablet; Take 1 tablet (5 mg total) by mouth every evening.   I have discontinued Everlene Cunning. Novosad's Belviq XR. I am also having her start on meloxicam. Additionally, I am having her maintain her conjugated estrogens, Cholecalciferol (VITAMIN D-3 PO), Turmeric Curcumin, Fosteum Plus, fluticasone, atorvastatin, clobetasol cream, and levocetirizine.  Meds ordered this encounter  Medications   clobetasol cream (TEMOVATE) 0.05 %    Sig: Apply 1 application topically 2 (two) times daily.    Dispense:  60 g    Refill:  1   meloxicam (MOBIC) 15 MG tablet    Sig: Take 1 tablet (15 mg total) by mouth daily.    Dispense:  30 tablet    Refill:  1   levocetirizine (XYZAL) 5 MG tablet    Sig: Take 1 tablet (5 mg total) by mouth every evening.    Dispense:  90 tablet    Refill:  1     Follow-up: Return if symptoms worsen or fail to improve.  Scarlette Calico, MD

## 2019-03-10 ENCOUNTER — Encounter: Payer: Self-pay | Admitting: Internal Medicine

## 2019-03-10 LAB — HEPATITIS C ANTIBODY
Hepatitis C Ab: NONREACTIVE
SIGNAL TO CUT-OFF: 0.01 (ref <1.00)

## 2019-03-11 DIAGNOSIS — H43393 Other vitreous opacities, bilateral: Secondary | ICD-10-CM | POA: Diagnosis not present

## 2019-03-11 DIAGNOSIS — H524 Presbyopia: Secondary | ICD-10-CM | POA: Diagnosis not present

## 2019-03-11 DIAGNOSIS — H5203 Hypermetropia, bilateral: Secondary | ICD-10-CM | POA: Diagnosis not present

## 2019-03-11 DIAGNOSIS — H25813 Combined forms of age-related cataract, bilateral: Secondary | ICD-10-CM | POA: Diagnosis not present

## 2019-03-11 DIAGNOSIS — H1850 Unspecified hereditary corneal dystrophies: Secondary | ICD-10-CM | POA: Diagnosis not present

## 2019-03-11 DIAGNOSIS — H43399 Other vitreous opacities, unspecified eye: Secondary | ICD-10-CM | POA: Diagnosis not present

## 2019-03-11 DIAGNOSIS — H52223 Regular astigmatism, bilateral: Secondary | ICD-10-CM | POA: Diagnosis not present

## 2019-04-08 DIAGNOSIS — R29890 Loss of height: Secondary | ICD-10-CM | POA: Diagnosis not present

## 2019-04-08 DIAGNOSIS — Z1231 Encounter for screening mammogram for malignant neoplasm of breast: Secondary | ICD-10-CM | POA: Diagnosis not present

## 2019-04-08 DIAGNOSIS — M8589 Other specified disorders of bone density and structure, multiple sites: Secondary | ICD-10-CM | POA: Diagnosis not present

## 2019-04-08 LAB — HM MAMMOGRAPHY

## 2019-04-08 LAB — HM DEXA SCAN: HM Dexa Scan: -1.4

## 2019-04-14 ENCOUNTER — Encounter: Payer: Self-pay | Admitting: Internal Medicine

## 2019-04-20 ENCOUNTER — Encounter: Payer: Self-pay | Admitting: Internal Medicine

## 2019-04-20 ENCOUNTER — Other Ambulatory Visit: Payer: Self-pay | Admitting: Internal Medicine

## 2019-04-20 DIAGNOSIS — G8929 Other chronic pain: Secondary | ICD-10-CM

## 2019-04-20 DIAGNOSIS — M47816 Spondylosis without myelopathy or radiculopathy, lumbar region: Secondary | ICD-10-CM

## 2019-04-20 MED ORDER — MELOXICAM 7.5 MG PO TABS: 7.5000 mg | ORAL_TABLET | Freq: Every day | ORAL | 1 refills | Status: DC

## 2019-05-04 ENCOUNTER — Other Ambulatory Visit: Payer: Self-pay | Admitting: Internal Medicine

## 2019-05-04 DIAGNOSIS — M47816 Spondylosis without myelopathy or radiculopathy, lumbar region: Secondary | ICD-10-CM

## 2019-05-04 DIAGNOSIS — G8929 Other chronic pain: Secondary | ICD-10-CM

## 2019-05-04 DIAGNOSIS — M549 Dorsalgia, unspecified: Secondary | ICD-10-CM

## 2019-05-04 MED ORDER — MELOXICAM 7.5 MG PO TABS: 7.5000 mg | ORAL_TABLET | Freq: Every day | ORAL | 1 refills | Status: DC

## 2019-05-14 ENCOUNTER — Other Ambulatory Visit: Payer: Self-pay | Admitting: Internal Medicine

## 2019-05-14 DIAGNOSIS — E785 Hyperlipidemia, unspecified: Secondary | ICD-10-CM

## 2019-05-14 MED ORDER — ATORVASTATIN CALCIUM 10 MG PO TABS: ORAL_TABLET | ORAL | 1 refills | Status: DC

## 2019-05-15 DIAGNOSIS — M7542 Impingement syndrome of left shoulder: Secondary | ICD-10-CM | POA: Diagnosis not present

## 2019-06-18 ENCOUNTER — Ambulatory Visit: Payer: Medicare Other | Admitting: Orthotics

## 2019-06-18 ENCOUNTER — Other Ambulatory Visit: Payer: Self-pay

## 2019-06-18 ENCOUNTER — Ambulatory Visit (INDEPENDENT_AMBULATORY_CARE_PROVIDER_SITE_OTHER): Payer: Medicare Other | Admitting: Podiatry

## 2019-06-18 ENCOUNTER — Encounter: Payer: Self-pay | Admitting: Podiatry

## 2019-06-18 ENCOUNTER — Ambulatory Visit (INDEPENDENT_AMBULATORY_CARE_PROVIDER_SITE_OTHER): Payer: Medicare Other

## 2019-06-18 DIAGNOSIS — M2011 Hallux valgus (acquired), right foot: Secondary | ICD-10-CM | POA: Diagnosis not present

## 2019-06-18 DIAGNOSIS — N904 Leukoplakia of vulva: Secondary | ICD-10-CM | POA: Insufficient documentation

## 2019-06-18 DIAGNOSIS — M779 Enthesopathy, unspecified: Secondary | ICD-10-CM | POA: Diagnosis not present

## 2019-06-18 DIAGNOSIS — M2012 Hallux valgus (acquired), left foot: Secondary | ICD-10-CM

## 2019-06-18 NOTE — Patient Instructions (Signed)
Bunion  A bunion is a bump on the base of the big toe that forms when the bones of the big toe joint move out of position. Bunions may be small at first, but they often get larger over time. They can make walking painful. What are the causes? A bunion may be caused by:  Wearing narrow or pointed shoes that force the big toe to press against the other toes.  Abnormal foot development that causes the foot to roll inward (pronate).  Changes in the foot that are caused by certain diseases, such as rheumatoid arthritis or polio.  A foot injury. What increases the risk? The following factors may make you more likely to develop this condition:  Wearing shoes that squeeze the toes together.  Having certain diseases, such as: ? Rheumatoid arthritis. ? Polio. ? Cerebral palsy.  Having family members who have bunions.  Being born with a foot deformity, such as flat feet or low arches.  Doing activities that put a lot of pressure on the feet, such as ballet dancing. What are the signs or symptoms? The main symptom of a bunion is a noticeable bump on the big toe. Other symptoms may include:  Pain.  Swelling around the big toe.  Redness and inflammation.  Thick or hardened skin on the big toe or between the toes.  Stiffness or loss of motion in the big toe.  Trouble with walking. How is this diagnosed? A bunion may be diagnosed based on your symptoms, medical history, and activities. You may have tests, such as:  X-rays. These allow your health care provider to check the position of the bones in your foot and look for damage to your joint. They also help your health care provider determine the severity of your bunion and the best way to treat it.  Joint aspiration. In this test, a sample of fluid is removed from the toe joint. This test may be done if you are in a lot of pain. It helps rule out diseases that cause painful swelling of the joints, such as arthritis. How is this  treated? Treatment depends on the severity of your symptoms. The goal of treatment is to relieve symptoms and prevent the bunion from getting worse. Your health care provider may recommend:  Wearing shoes that have a wide toe box.  Using bunion pads to cushion the affected area.  Taping your toes together to keep them in a normal position.  Placing a device inside your shoe (orthotics) to help reduce pressure on your toe joint.  Taking medicine to ease pain, inflammation, and swelling.  Applying heat or ice to the affected area.  Doing stretching exercises.  Surgery to remove scar tissue and move the toes back into their normal position. This treatment is rare. Follow these instructions at home: Managing pain, stiffness, and swelling   If directed, put ice on the painful area: ? Put ice in a plastic bag. ? Place a towel between your skin and the bag. ? Leave the ice on for 20 minutes, 2-3 times a day. Activity   If directed, apply heat to the affected area before you exercise. Use the heat source that your health care provider recommends, such as a moist heat pack or a heating pad. ? Place a towel between your skin and the heat source. ? Leave the heat on for 20-30 minutes. ? Remove the heat if your skin turns bright red. This is especially important if you are unable to feel pain,   heat, or cold. You may have a greater risk of getting burned.  Do exercises as told by your health care provider. General instructions  Support your toe joint with proper footwear, shoe padding, or taping as told by your health care provider.  Take over-the-counter and prescription medicines only as told by your health care provider.  Keep all follow-up visits as told by your health care provider. This is important. Contact a health care provider if your symptoms:  Get worse.  Do not improve in 2 weeks. Get help right away if you have:  Severe pain and trouble with walking. Summary  A  bunion is a bump on the base of the big toe that forms when the bones of the big toe joint move out of position.  Bunions can make walking painful.  Treatment depends on the severity of your symptoms.  Support your toe joint with proper footwear, shoe padding, or taping as told by your health care provider. This information is not intended to replace advice given to you by your health care provider. Make sure you discuss any questions you have with your health care provider. Document Released: 08/27/2005 Document Revised: 03/03/2018 Document Reviewed: 01/07/2018 Elsevier Patient Education  2020 [employer]  

## 2019-06-18 NOTE — Progress Notes (Signed)
   Subjective:    Patient ID: Madison Rowe, female    DOB: 09-27-1950, 68 y.o.   MRN: [id number]  HPI    Review of Systems     Objective:   Physical Exam        Assessment & Plan:

## 2019-06-18 NOTE — Progress Notes (Signed)
Subjective:   Patient ID: Madison Rowe, female   DOB: 68 y.o.   MRN: [id number]   HPI Patient presents concerned about structural bunion and digital deformity right stating it seems to be getting worse in the second toes lifting further in the air.  It is moderate in its nature but she is concerned about future and she is worn orthotics in the past which have worn out   ROS      Objective:  Physical Exam  Neurovascular status intact with significant bunion deformity right over left with moderate rigid contracture digit to right     Assessment:  Gradual worsening of deformity secondary to foot structure     Plan:  H&P x-rays reviewed and today I went ahead and recommended new orthotics which are done by ped orthotist.  I applied padding to the hallux explaining the separation of the toes and if it gets worse ultimately may require Lapidus fusion along with digital fusion  X-rays indicate significant structural deformity right over left with elevation of the intermetatarsal angle

## 2019-06-18 NOTE — Progress Notes (Signed)
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  This time plan is to hug arch

## 2019-07-16 ENCOUNTER — Other Ambulatory Visit: Payer: Self-pay

## 2019-07-16 ENCOUNTER — Ambulatory Visit: Payer: Medicare Other | Admitting: Orthotics

## 2019-07-16 DIAGNOSIS — M2011 Hallux valgus (acquired), right foot: Secondary | ICD-10-CM

## 2019-07-16 DIAGNOSIS — M779 Enthesopathy, unspecified: Secondary | ICD-10-CM

## 2019-07-16 NOTE — Progress Notes (Signed)
Patient came in today to pick up custom made foot orthotics.  The goals were accomplished and the patient reported no dissatisfaction with said orthotics.  Patient was advised of breakin period and how to report any issues. 

## 2019-07-27 ENCOUNTER — Other Ambulatory Visit: Payer: Self-pay

## 2019-07-27 ENCOUNTER — Ambulatory Visit: Payer: Medicare Other | Admitting: Orthotics

## 2019-07-27 DIAGNOSIS — M2012 Hallux valgus (acquired), left foot: Secondary | ICD-10-CM

## 2019-07-27 DIAGNOSIS — M2011 Hallux valgus (acquired), right foot: Secondary | ICD-10-CM

## 2019-07-27 DIAGNOSIS — M779 Enthesopathy, unspecified: Secondary | ICD-10-CM

## 2019-07-27 NOTE — Progress Notes (Signed)
Remove met pad

## 2019-08-05 ENCOUNTER — Telehealth: Payer: Self-pay | Admitting: Podiatry

## 2019-08-05 NOTE — Telephone Encounter (Signed)
Pt left message late yesterday asking about status of orthotics. The ones Rick adjusted for her until the new ones come in are not working, especially the left one.   I checked with Angela Cox and they are in production and should be here next week sometime. I have notified pt and will call pt when they come in.Marland KitchenMarland Kitchen

## 2019-09-28 DIAGNOSIS — L900 Lichen sclerosus et atrophicus: Secondary | ICD-10-CM | POA: Diagnosis not present

## 2019-09-28 DIAGNOSIS — Z124 Encounter for screening for malignant neoplasm of cervix: Secondary | ICD-10-CM | POA: Diagnosis not present

## 2019-09-28 DIAGNOSIS — Z1389 Encounter for screening for other disorder: Secondary | ICD-10-CM | POA: Diagnosis not present

## 2019-09-30 ENCOUNTER — Other Ambulatory Visit: Payer: Self-pay

## 2019-09-30 ENCOUNTER — Ambulatory Visit: Payer: Medicare Other | Attending: Internal Medicine

## 2019-09-30 DIAGNOSIS — Z23 Encounter for immunization: Secondary | ICD-10-CM | POA: Insufficient documentation

## 2019-09-30 NOTE — Progress Notes (Signed)
   Covid-19 Vaccination Clinic  Name:  RAYOLA FLYTHE    MRN: [id number] DOB: 1951/04/16  09/30/2019  Ms. Handrich was observed post Covid-19 immunization for 15 minutes without incidence. She was provided with Vaccine Information Sheet and instruction to access the V-Safe system.   Ms. Rizvi was instructed to call 911 with any severe reactions post vaccine: Marland Kitchen Difficulty breathing  . Swelling of your face and throat  . A fast heartbeat  . A bad rash all over your body  . Dizziness and weakness    Immunizations Administered    Name Date Dose VIS Date Route   Pfizer COVID-19 Vaccine 09/30/2019 12:06 PM 0.3 mL 08/21/2019 Intramuscular   Manufacturer: [place]   Lot: [id number]   [place]: [id number]

## 2019-10-07 ENCOUNTER — Telehealth: Payer: Self-pay | Admitting: *Deleted

## 2019-10-07 NOTE — Telephone Encounter (Signed)
Pt asked how she could treat the fungal toenails.

## 2019-10-07 NOTE — Telephone Encounter (Signed)
I informed pt that there were multiple treatments for fungal toenails, but 1st the type of fungus would need to be diagnosed with a specimen being sent to a lab to specify the organism, because different fungus respond to different treatments. Pt states understanding and I transferred to schedulers.

## 2019-10-09 ENCOUNTER — Encounter: Payer: Self-pay | Admitting: Podiatry

## 2019-10-09 ENCOUNTER — Other Ambulatory Visit: Payer: Self-pay

## 2019-10-09 ENCOUNTER — Ambulatory Visit: Payer: Medicare Other

## 2019-10-09 ENCOUNTER — Ambulatory Visit (INDEPENDENT_AMBULATORY_CARE_PROVIDER_SITE_OTHER): Payer: Medicare Other | Admitting: Podiatry

## 2019-10-09 VITALS — Temp 97.4°F

## 2019-10-09 DIAGNOSIS — B351 Tinea unguium: Secondary | ICD-10-CM | POA: Diagnosis not present

## 2019-10-09 DIAGNOSIS — M779 Enthesopathy, unspecified: Secondary | ICD-10-CM

## 2019-10-09 DIAGNOSIS — M2012 Hallux valgus (acquired), left foot: Secondary | ICD-10-CM | POA: Diagnosis not present

## 2019-10-09 DIAGNOSIS — M2011 Hallux valgus (acquired), right foot: Secondary | ICD-10-CM

## 2019-10-09 MED ORDER — TERBINAFINE HCL 250 MG PO TABS: ORAL_TABLET | ORAL | 0 refills | Status: DC

## 2019-10-09 NOTE — Progress Notes (Signed)
Subjective:   Patient ID: Madison Rowe, female   DOB: 69 y.o.   MRN: [id number]   HPI Pain should presents stating concerned about discoloration of the big toenails of both feet left over right and also midfoot pain in the left foot.  Concerned about both problems F2   ROS      Objective:  Physical Exam  Neurovascular status intact negative Bevelyn Buckles' sign noted with patient found to have discoloration of the distal two thirds nailbed left over right with some lifting of the nail.  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  For the nail will use topical medications and I did place on a course of Lamisil treatment explaining laser which may be possible

## 2019-10-19 ENCOUNTER — Ambulatory Visit: Payer: Medicare Other

## 2019-10-22 ENCOUNTER — Ambulatory Visit: Payer: Medicare Other | Attending: Internal Medicine

## 2019-10-22 DIAGNOSIS — Z23 Encounter for immunization: Secondary | ICD-10-CM | POA: Insufficient documentation

## 2019-10-22 NOTE — Progress Notes (Signed)
   Covid-19 Vaccination Clinic  Name:  Madison Rowe    MRN: [id number] DOB: 1950/12/27  10/22/2019  Ms. Madison Rowe was observed post Covid-19 immunization for 15 minutes without incidence. She was provided with Vaccine Information Sheet and instruction to access the V-Safe system.   Ms. Madison Rowe was instructed to call 911 with any severe reactions post vaccine: Marland Kitchen Difficulty breathing  . Swelling of your face and throat  . A fast heartbeat  . A bad rash all over your body  . Dizziness and weakness    Immunizations Administered    Name Date Dose VIS Date Route   Pfizer COVID-19 Vaccine 10/22/2019  8:24 AM 0.3 mL 08/21/2019 Intramuscular   Manufacturer: [place]   Lot: [id number]   [place]: [id number]

## 2019-11-27 ENCOUNTER — Other Ambulatory Visit: Payer: Self-pay | Admitting: Internal Medicine

## 2019-11-27 DIAGNOSIS — E785 Hyperlipidemia, unspecified: Secondary | ICD-10-CM

## 2019-12-02 DIAGNOSIS — M79622 Pain in left upper arm: Secondary | ICD-10-CM | POA: Diagnosis not present

## 2019-12-04 DIAGNOSIS — M79622 Pain in left upper arm: Secondary | ICD-10-CM | POA: Diagnosis not present

## 2019-12-08 ENCOUNTER — Other Ambulatory Visit: Payer: Self-pay | Admitting: Internal Medicine

## 2019-12-08 DIAGNOSIS — Z91038 Other insect allergy status: Secondary | ICD-10-CM

## 2019-12-08 DIAGNOSIS — L719 Rosacea, unspecified: Secondary | ICD-10-CM | POA: Diagnosis not present

## 2019-12-10 DIAGNOSIS — M79622 Pain in left upper arm: Secondary | ICD-10-CM | POA: Diagnosis not present

## 2019-12-14 DIAGNOSIS — M79622 Pain in left upper arm: Secondary | ICD-10-CM | POA: Diagnosis not present

## 2019-12-16 DIAGNOSIS — M79622 Pain in left upper arm: Secondary | ICD-10-CM | POA: Diagnosis not present

## 2019-12-21 DIAGNOSIS — M79622 Pain in left upper arm: Secondary | ICD-10-CM | POA: Diagnosis not present

## 2019-12-23 ENCOUNTER — Ambulatory Visit (INDEPENDENT_AMBULATORY_CARE_PROVIDER_SITE_OTHER): Payer: Medicare Other

## 2019-12-23 ENCOUNTER — Other Ambulatory Visit: Payer: Self-pay | Admitting: Podiatry

## 2019-12-23 ENCOUNTER — Ambulatory Visit (INDEPENDENT_AMBULATORY_CARE_PROVIDER_SITE_OTHER): Payer: Medicare Other | Admitting: Podiatry

## 2019-12-23 ENCOUNTER — Other Ambulatory Visit: Payer: Self-pay

## 2019-12-23 ENCOUNTER — Encounter: Payer: Self-pay | Admitting: Podiatry

## 2019-12-23 VITALS — Temp 97.3°F

## 2019-12-23 DIAGNOSIS — M2012 Hallux valgus (acquired), left foot: Secondary | ICD-10-CM

## 2019-12-23 DIAGNOSIS — M2011 Hallux valgus (acquired), right foot: Secondary | ICD-10-CM

## 2019-12-23 DIAGNOSIS — M79672 Pain in left foot: Secondary | ICD-10-CM

## 2019-12-23 DIAGNOSIS — M778 Other enthesopathies, not elsewhere classified: Secondary | ICD-10-CM

## 2019-12-23 DIAGNOSIS — M79622 Pain in left upper arm: Secondary | ICD-10-CM | POA: Diagnosis not present

## 2019-12-24 NOTE — Progress Notes (Signed)
Subjective:   Patient ID: Madison Rowe, female   DOB: 69 y.o.   MRN: [id number]   HPI Patient states she is had a reoccurrence of pain in the dorsum of the left foot and it started to hurt again several months.  Patient also has significant structural bunion deformity bilateral   ROS      Objective:  Physical Exam  Neurovascular status intact with inflammation pain dorsal medial aspect left that is painful when pressed but improved.  Patient is noted also to have prominence of the first metatarsal head bilateral which she has concerns about with possibility of surgical intervention     Assessment:  Acute tendinitis dorsal left.  Large structural HAV deformity bilateral     Plan:  Reviewed condition today for the dorsal area area I did do sterile prep and injected the extensor complex 3 mg Kenalog 5 mg Xylocaine and went ahead discussed bunions do not recommend treatment currently

## 2020-03-07 ENCOUNTER — Encounter: Payer: Self-pay | Admitting: Family

## 2020-03-07 ENCOUNTER — Other Ambulatory Visit: Payer: Self-pay

## 2020-03-07 ENCOUNTER — Ambulatory Visit (INDEPENDENT_AMBULATORY_CARE_PROVIDER_SITE_OTHER): Payer: Medicare Other | Admitting: Family

## 2020-03-07 VITALS — BP 108/66 | HR 90 | Temp 97.7°F | Ht 64.0 in | Wt 200.0 lb

## 2020-03-07 DIAGNOSIS — T148XXA Other injury of unspecified body region, initial encounter: Secondary | ICD-10-CM | POA: Diagnosis not present

## 2020-03-07 LAB — COMPREHENSIVE METABOLIC PANEL
ALT: 20 U/L (ref 0–35)
AST: 19 U/L (ref 0–37)
Albumin: 4.3 g/dL (ref 3.5–5.2)
Alkaline Phosphatase: 54 U/L (ref 39–117)
BUN: 18 mg/dL (ref 6–23)
CO2: 29 mEq/L (ref 19–32)
Calcium: 9.6 mg/dL (ref 8.4–10.5)
Chloride: 102 mEq/L (ref 96–112)
Creatinine, Ser: 0.69 mg/dL (ref 0.40–1.20)
GFR: 83.8 mL/min (ref >60.00)
Glucose, Bld: 84 mg/dL (ref 70–99)
Potassium: 4.2 mEq/L (ref 3.5–5.1)
Sodium: 138 mEq/L (ref 135–145)
Total Bilirubin: 0.4 mg/dL (ref 0.2–1.2)
Total Protein: 6.6 g/dL (ref 6.0–8.3)

## 2020-03-07 LAB — CBC WITH DIFFERENTIAL/PLATELET
Basophils Absolute: 0 10*3/uL (ref 0.0–0.1)
Basophils Relative: 0.7 % (ref 0.0–3.0)
Eosinophils Absolute: 0.1 10*3/uL (ref 0.0–0.7)
Eosinophils Relative: 2.3 % (ref 0.0–5.0)
HCT: 32.6 % — ABNORMAL LOW (ref 36.0–46.0)
Hemoglobin: 10.7 g/dL — ABNORMAL LOW (ref 12.0–15.0)
Lymphocytes Relative: 28.7 % (ref 12.0–46.0)
Lymphs Abs: 1.7 10*3/uL (ref 0.7–4.0)
MCHC: 32.7 g/dL (ref 30.0–36.0)
MCV: 82.8 fl (ref 78.0–100.0)
Monocytes Absolute: 0.6 10*3/uL (ref 0.1–1.0)
Monocytes Relative: 10.6 % (ref 3.0–12.0)
Neutro Abs: 3.4 10*3/uL (ref 1.4–7.7)
Neutrophils Relative %: 57.7 % (ref 43.0–77.0)
Platelets: 214 10*3/uL (ref 150.0–400.0)
RBC: 3.94 Mil/uL (ref 3.87–5.11)
RDW: 15.7 % — ABNORMAL HIGH (ref 11.5–15.5)
WBC: 5.9 10*3/uL (ref 4.0–10.5)

## 2020-03-07 MED ORDER — SULFAMETHOXAZOLE-TRIMETHOPRIM 800-160 MG PO TABS: 1.0000 | ORAL_TABLET | Freq: Two times a day (BID) | ORAL | 0 refills | Status: DC

## 2020-03-07 NOTE — Progress Notes (Signed)
Madison Rowe is a 69 y.o. female with the following history as recorded in [employer]:  Patient Active Problem List   Diagnosis Date Noted   Lichen sclerosus of female genitalia 06/18/2019   Allergic reaction to insect bite 03/09/2019   Muscle strain of left upper arm 03/09/2019   Left upper arm injury, initial encounter 03/09/2019   Conductive hearing loss in right ear 06/17/2018   Right ear impacted cerumen 06/17/2018   Hypertensive retinopathy of left eye 01/01/2018   Essential hypertension 01/01/2018   Other specified hypothyroidism 05/26/2015   Eczema, dyshidrotic 05/26/2015   Vitamin D deficiency 05/26/2015   Back pain 11/12/2013   Osteoporosis 09/27/2011   Dyslipidemia, goal LDL below 130 09/27/2011   Preventative health care 08/13/2011   Morbid obesity due to excess calories ([place]) 03/17/2008   ALLERGIC RHINITIS 03/17/2008    Current Outpatient Medications  Medication Sig Dispense Refill   atorvastatin (LIPITOR) 10 MG tablet TAKE 1 TABLET(10 MG) BY MOUTH DAILY 90 tablet 1   Cholecalciferol (VITAMIN D-3 PO) Take by mouth daily.     clobetasol cream (TEMOVATE) 0.05 % APPLY EXTERNALLY TO THE AFFECTED AREA TWICE DAILY 60 g 1   conjugated estrogens (PREMARIN) vaginal cream Place vaginally as directed. 2-3 times weekly.     Dietary Management Product (FOSTEUM PLUS) CAPS TAKE ONE CAPSULE BY MOUTH TWICE DAILY (Patient taking differently: For Bone Density) 180 capsule 1   meloxicam (MOBIC) 7.5 MG tablet Take 7.5 mg by mouth daily.     sulfamethoxazole-trimethoprim (BACTRIM DS) 800-160 MG tablet Take 1 tablet by mouth 2 (two) times daily. 10 tablet 0   No current facility-administered medications for this visit.    Allergies: Alendronate sodium [alendronate sodium], Olmesartan, and Codeine  Past Medical History:  Diagnosis Date   Allergy    Anemia    Arthritis    Osteoporosis 2010   Vitamin D deficient osteomalacia 2010    Past Surgical History:   Procedure Laterality Date   BREAST SURGERY     ECTOPIC PREGNANCY SURGERY     FOOT SURGERY Left    TONSILLECTOMY      Family History  Problem Relation Age of Onset   Kidney disease Father    Kidney disease Maternal Grandfather    Hypertension Mother    Cancer Neg Hx    Heart disease Neg Hx    Colon cancer Neg Hx    Esophageal cancer Neg Hx    Rectal cancer Neg Hx    Stomach cancer Neg Hx     Social History   Tobacco Use   Smoking status: Never Smoker   Smokeless tobacco: Never Used  Substance Use Topics   Alcohol use: Yes    Comment: Occasional    Subjective:  Blood blisters on both feet- 1st toes of bilateral feet affected; left more than right; symptoms started last week while she was vacationing at [employer]; does not go barefoot; no known injury or trauma to her feet; has been using Espom salt soaks;right foot is improving quicker than left foot;   Objective:  Vitals:   03/07/20 1039  BP: 108/66  Pulse: 90  Temp: 97.7 F (36.5 C)  TempSrc: Oral  SpO2: 97%  Weight: 200 lb (90.7 kg)  Height: '5\' 4"'$  (1.626 m)    General: Well developed, well nourished, in no acute distress  Skin : Warm and dry. Large blood blister noted around tip of left toe; some bruising also noted on left forearm Head: Normocephalic and atraumatic  Lungs: Respirations unlabored;  Musculoskeletal: No deformities; no active joint inflammation  Extremities: No edema, cyanosis, clubbing  Vessels: Symmetric bilaterally  Neurologic: Alert and oriented; speech intact; face symmetrical; moves all extremities well; CNII-XII intact without focal deficit  Assessment:  1. Blister   2. Bruising     Plan:  1. Will try to get patient seen at her podiatrist as soon as possible due to odd appearance of toe; Rx for Bactrim DS bid x 5 days; continue Epsom salt soaks;  2. Check CBC, CMP today;  This visit occurred during the SARS-CoV-2 public health emergency.  Safety protocols were in place,  including screening questions prior to the visit, additional usage of staff PPE, and extensive cleaning of exam room while observing appropriate contact time as indicated for disinfecting solutions.     No follow-ups on file.  Orders Placed This Encounter  Procedures   CBC with Differential/Platelet   Comp Met (CMET)   Ambulatory referral to Podiatry    Referral Priority:   Emergency    Referral Type:   Consultation    Referral Reason:   Specialty Services Required    Requested Specialty:   Podiatry    Number of Visits Requested:   1    Requested Prescriptions   Signed Prescriptions Disp Refills   sulfamethoxazole-trimethoprim (BACTRIM DS) 800-160 MG tablet 10 tablet 0    Sig: Take 1 tablet by mouth 2 (two) times daily.

## 2020-03-10 ENCOUNTER — Ambulatory Visit: Payer: Medicare Other | Admitting: Podiatry

## 2020-03-11 ENCOUNTER — Other Ambulatory Visit: Payer: Self-pay | Admitting: Family

## 2020-03-11 DIAGNOSIS — D649 Anemia, unspecified: Secondary | ICD-10-CM

## 2020-03-23 ENCOUNTER — Ambulatory Visit: Payer: Medicare Other | Admitting: Podiatry

## 2020-04-06 ENCOUNTER — Telehealth: Payer: Self-pay

## 2020-04-06 DIAGNOSIS — R0602 Shortness of breath: Secondary | ICD-10-CM | POA: Diagnosis not present

## 2020-04-06 DIAGNOSIS — R2242 Localized swelling, mass and lump, left lower limb: Secondary | ICD-10-CM | POA: Diagnosis not present

## 2020-04-06 DIAGNOSIS — M7989 Other specified soft tissue disorders: Secondary | ICD-10-CM | POA: Diagnosis not present

## 2020-04-06 DIAGNOSIS — Z20822 Contact with and (suspected) exposure to covid-19: Secondary | ICD-10-CM | POA: Diagnosis not present

## 2020-04-06 DIAGNOSIS — R42 Dizziness and giddiness: Secondary | ICD-10-CM | POA: Diagnosis not present

## 2020-04-06 DIAGNOSIS — R918 Other nonspecific abnormal finding of lung field: Secondary | ICD-10-CM | POA: Diagnosis not present

## 2020-04-06 DIAGNOSIS — R600 Localized edema: Secondary | ICD-10-CM | POA: Diagnosis not present

## 2020-04-06 NOTE — Telephone Encounter (Signed)
Per [facility] note, patient called 04/06/2020 12:45:33 PM and states she is having chills and she is real clammy. She has a dry mouth. Her left ankle was swelling this morning. She took HCTZ this morning. She is sweating.  Patient was advised to go to ED now.

## 2020-04-18 LAB — HM MAMMOGRAPHY: HM Mammogram: NORMAL (ref 0–4)

## 2020-04-19 ENCOUNTER — Encounter: Payer: Self-pay | Admitting: Internal Medicine

## 2020-04-19 ENCOUNTER — Other Ambulatory Visit: Payer: Self-pay

## 2020-04-19 ENCOUNTER — Ambulatory Visit (INDEPENDENT_AMBULATORY_CARE_PROVIDER_SITE_OTHER): Payer: Medicare Other | Admitting: Internal Medicine

## 2020-04-19 VITALS — BP 138/64 | HR 86 | Temp 98.3°F | Ht 64.0 in | Wt 195.0 lb

## 2020-04-19 DIAGNOSIS — E611 Iron deficiency: Secondary | ICD-10-CM | POA: Diagnosis not present

## 2020-04-19 DIAGNOSIS — I10 Essential (primary) hypertension: Secondary | ICD-10-CM

## 2020-04-19 DIAGNOSIS — L299 Pruritus, unspecified: Secondary | ICD-10-CM | POA: Diagnosis not present

## 2020-04-19 DIAGNOSIS — E038 Other specified hypothyroidism: Secondary | ICD-10-CM

## 2020-04-19 DIAGNOSIS — B078 Other viral warts: Secondary | ICD-10-CM | POA: Diagnosis not present

## 2020-04-19 DIAGNOSIS — D539 Nutritional anemia, unspecified: Secondary | ICD-10-CM

## 2020-04-19 DIAGNOSIS — R918 Other nonspecific abnormal finding of lung field: Secondary | ICD-10-CM | POA: Insufficient documentation

## 2020-04-19 DIAGNOSIS — E785 Hyperlipidemia, unspecified: Secondary | ICD-10-CM | POA: Diagnosis not present

## 2020-04-19 DIAGNOSIS — E041 Nontoxic single thyroid nodule: Secondary | ICD-10-CM | POA: Diagnosis not present

## 2020-04-19 DIAGNOSIS — L570 Actinic keratosis: Secondary | ICD-10-CM | POA: Diagnosis not present

## 2020-04-19 DIAGNOSIS — E559 Vitamin D deficiency, unspecified: Secondary | ICD-10-CM

## 2020-04-19 DIAGNOSIS — L918 Other hypertrophic disorders of the skin: Secondary | ICD-10-CM | POA: Diagnosis not present

## 2020-04-19 LAB — SARS-COV-2 IGG: SARS-COV-2 IgG: 1.71

## 2020-04-19 NOTE — Progress Notes (Signed)
Subjective:  Patient ID: Madison Rowe, female    DOB: Aug 30, 1951  Age: 69 y.o. MRN: [id number]  CC: Annual Exam, Anemia, and Hyperlipidemia  This visit occurred during the SARS-CoV-2 public health emergency.  Safety protocols were in place, including screening questions prior to the visit, additional usage of staff PPE, and extensive cleaning of exam room while observing appropriate contact time as indicated for disinfecting solutions.    HPI Madison Rowe presents for a CPX.  About 2 weeks ago she developed ankle swelling, worse on the left than the right.  She decided to take a couple of doses of her husband's diuretic and she quickly became hypotensive and near syncopal.  She was seen at a local hospital in [place], [place] and had an extensive work-up.  The only information I have from that work-up is that her CT scan showed groundglass opacifications and a thyroid nodule.  The lung pathology was treated with Zithromax.  She complains of shortness of breath but she denies cough, chest pain, diaphoresis, fever, chills, night sweats, or weight loss.  She had other lab work done about 6 weeks ago and was found to be anemic.  She complains of fatigue but is not aware of any sources of blood loss.  She denies paresthesias.  Outpatient Medications Prior to Visit  Medication Sig Dispense Refill   atorvastatin (LIPITOR) 10 MG tablet TAKE 1 TABLET(10 MG) BY MOUTH DAILY 90 tablet 1   clobetasol cream (TEMOVATE) 0.05 % APPLY EXTERNALLY TO THE AFFECTED AREA TWICE DAILY 60 g 1   conjugated estrogens (PREMARIN) vaginal cream Place vaginally as directed. 2-3 times weekly.     meloxicam (MOBIC) 7.5 MG tablet Take 7.5 mg by mouth daily.     Cholecalciferol (VITAMIN D-3 PO) Take by mouth daily.     Dietary Management Product (FOSTEUM PLUS) CAPS TAKE ONE CAPSULE BY MOUTH TWICE DAILY (Patient taking differently: For Bone Density) 180 capsule 1   sulfamethoxazole-trimethoprim (BACTRIM DS)  800-160 MG tablet Take 1 tablet by mouth 2 (two) times daily. 10 tablet 0   No facility-administered medications prior to visit.    ROS Review of Systems  Constitutional: Positive for fatigue. Negative for appetite change, chills, diaphoresis and unexpected weight change.  HENT: Negative.   Eyes: Negative.   Respiratory: Positive for shortness of breath. Negative for cough, chest tightness and wheezing.   Cardiovascular: Negative for chest pain, palpitations and leg swelling.  Gastrointestinal: Negative for abdominal pain, blood in stool, constipation, diarrhea, nausea and vomiting.  Endocrine: Negative.   Genitourinary: Negative.  Negative for difficulty urinating, dysuria, frequency, hematuria and urgency.  Musculoskeletal: Negative for arthralgias and myalgias.  Skin: Negative.  Negative for color change and pallor.  Neurological: Negative.  Negative for dizziness, weakness, light-headedness, numbness and headaches.  Hematological: Negative for adenopathy. Does not bruise/bleed easily.  Psychiatric/Behavioral: Negative.     Objective:  BP 138/64 (BP Location: Left Arm, Patient Position: Sitting, Cuff Size: Large)    Pulse 86    Temp 98.3 F (36.8 C) (Oral)    Ht 5\' 4"  (1.626 m)    Wt 195 lb (88.5 kg)    SpO2 96%    BMI 33.47 kg/m   BP Readings from Last 3 Encounters:  04/19/20 138/64  03/07/20 108/66  03/09/19 126/78    Wt Readings from Last 3 Encounters:  04/19/20 195 lb (88.5 kg)  03/07/20 200 lb (90.7 kg)  03/09/19 189 lb (85.7 kg)    Physical Exam Vitals  reviewed.  Constitutional:      General: She is not in acute distress.    Appearance: Normal appearance. She is obese. She is not toxic-appearing or diaphoretic.  HENT:     Nose: Nose normal.     Mouth/Throat:     Mouth: Mucous membranes are moist.  Eyes:     General: No scleral icterus.    Conjunctiva/sclera: Conjunctivae normal.  Neck:     Thyroid: Thyromegaly present. No thyroid mass or thyroid  tenderness.  Cardiovascular:     Rate and Rhythm: Normal rate and regular rhythm.     Heart sounds: No murmur heard.   Pulmonary:     Effort: Pulmonary effort is normal.     Breath sounds: No stridor. No wheezing, rhonchi or rales.  Abdominal:     General: Abdomen is flat.     Palpations: There is no mass.     Tenderness: There is no abdominal tenderness.  Musculoskeletal:        General: Normal range of motion.     Cervical back: Neck supple.     Right lower leg: No edema.     Left lower leg: No edema.  Lymphadenopathy:     Cervical: No cervical adenopathy.  Skin:    General: Skin is warm and dry.     Coloration: Skin is not pale.  Neurological:     General: No focal deficit present.     Mental Status: She is alert and oriented to person, place, and time. Mental status is at baseline.  Psychiatric:        Mood and Affect: Mood normal.        Behavior: Behavior normal.     Lab Results  Component Value Date   WBC 6.0 04/19/2020   HGB 11.4 (L) 04/19/2020   HCT 35.9 04/19/2020   PLT 260 04/19/2020   GLUCOSE 92 04/19/2020   CHOL 159 04/19/2020   TRIG 51 04/19/2020   HDL 69 04/19/2020   LDLDIRECT 122.7 09/27/2011   LDLCALC 77 04/19/2020   ALT 17 04/19/2020   AST 16 04/19/2020   NA 138 04/19/2020   K 4.4 04/19/2020   CL 105 04/19/2020   CREATININE 0.67 04/19/2020   BUN 14 04/19/2020   CO2 27 04/19/2020   TSH 1.83 04/19/2020    DG Humerus Left  Result Date: 03/09/2019 CLINICAL DATA:  LEFT humeral pain for 2 weeks, no known injury EXAM: LEFT HUMERUS - 2+ VIEW COMPARISON:  None FINDINGS: Osseous demineralization. Shoulder and elbow joint alignments grossly normal. AC joint alignment normal. No fracture, dislocation or bone destruction. IMPRESSION: No acute abnormalities. Electronically Signed   By: Lavonia Dana M.D.   On: 03/09/2019 19:00   No results found.  Assessment & Plan:   Madison Rowe was seen today for annual exam, anemia and hyperlipidemia.  Diagnoses and  all orders for this visit:  Other specified hypothyroidism- Her TSH is in the normal range.  She will stay on the current dose of levothyroxine. -     TSH; Future -     TSH  Essential hypertension- Her blood pressure is adequately well controlled. -     BASIC METABOLIC PANEL WITH GFR; Future -     BASIC METABOLIC PANEL WITH GFR  Vitamin D deficiency -     VITAMIN D 25 Hydroxy (Vit-D Deficiency, Fractures); Future -     VITAMIN D 25 Hydroxy (Vit-D Deficiency, Fractures) -     Cholecalciferol 50 MCG (2000 UT) TABS; Take 1  tablet (2,000 Units total) by mouth daily.  Ground glass opacity present on imaging of lung- Her IgG SARS COVID-19 antibody is faintly positive consistent with a remote history of vaccination.  I do not think this finding is related to a COVID-19 infection.  She has improved after a course of azithromycin.  I recommended she follow-up with pulmonology. -     SARS-COV-2 IgG; Future -     SARS-COV-2 IgG -     Ambulatory referral to Pulmonology  Dyslipidemia, goal LDL below 130- She has achieved her LDL goal and is doing well on the statin. -     Lipid panel; Future -     Hepatic function panel; Future -     Hepatic function panel -     Lipid panel  Deficiency anemia- Her anemia has improved but she remains mildly anemic.  Screening for vitamin deficiency only reveals a mild iron deficiency. 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  I recommended that she see ENT to consider having this biopsied. -     US THYROID; Future -     Ambulatory referral to ENT  Iron deficiency -     Iron-Folic  [id number] (FERRAPLUS 90) 90-1 MG TABS; Take 1 tablet by mouth daily.   I have discontinued Lillia Pauls. Mickelsen's Cholecalciferol (VITAMIN D-3 PO) and sulfamethoxazole-trimethoprim. I am also having her start on FerraPlus 90 and Cholecalciferol. Additionally, I am having her maintain her conjugated estrogens, atorvastatin, clobetasol cream, and meloxicam.  Meds ordered this encounter  Medications   Iron-Folic [id number] (FERRAPLUS 90) 90-1 MG TABS    Sig: Take 1 tablet by mouth daily.    Dispense:  90 tablet    Refill:  1   Cholecalciferol 50 MCG (2000 UT) TABS    Sig: Take 1 tablet (2,000 Units total) by mouth daily.    Dispense:  90 tablet    Refill:  1   I spent 50 minutes in preparing to see the patient by review of recent labs, imaging and procedures, obtaining and reviewing separately obtained history, communicating with the patient and family or caregiver, ordering medications, tests or procedures, and documenting clinical information in the EHR including the differential Dx, treatment, and any further evaluation and other management of 1. Other specified hypothyroidism 2. Essential hypertension 3. Vitamin D deficiency 4. Ground glass opacity present on imaging of lung 5. Dyslipidemia, goal LDL below 130 6. Deficiency anemia 7. 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 Iron deficiency     Follow-up: Return in about 3 months (around 07/20/2020).  Scarlette Calico, MD

## 2020-04-19 NOTE — Patient Instructions (Signed)
Anemia  Anemia is a condition in which you do not have enough red blood cells or hemoglobin. Hemoglobin is a substance in red blood cells that carries oxygen. When you do not have enough red blood cells or hemoglobin (are anemic), your body cannot get enough oxygen and your organs may not work properly. As a result, you may feel very tired or have other problems. What are the causes? Common causes of anemia include:  Excessive bleeding. Anemia can be caused by excessive bleeding inside or outside the body, including bleeding from the intestine or from periods in women.  Poor nutrition.  Long-lasting (chronic) kidney, thyroid, and liver disease.  Bone marrow disorders.  Cancer and treatments for cancer.  HIV (human immunodeficiency virus) and AIDS (acquired immunodeficiency syndrome).  Treatments for HIV and AIDS.  Spleen problems.  Blood disorders.  Infections, medicines, and autoimmune disorders that destroy red blood cells. What are the signs or symptoms? Symptoms of this condition include:  Minor weakness.  Dizziness.  Headache.  Feeling heartbeats that are irregular or faster than normal (palpitations).  Shortness of breath, especially with exercise.  Paleness.  Cold sensitivity.  Indigestion.  Nausea.  Difficulty sleeping.  Difficulty concentrating. Symptoms may occur suddenly or develop slowly. If your anemia is mild, you may not have symptoms. How is this diagnosed? This condition is diagnosed based on:  Blood tests.  Your medical history.  A physical exam.  Bone marrow biopsy. Your health care provider may also check your stool (feces) for blood and may do additional testing to look for the cause of your bleeding. You may also have other tests, including:  Imaging tests, such as a CT scan or MRI.  Endoscopy.  Colonoscopy. How is this treated? Treatment for this condition depends on the cause. If you continue to lose a lot of blood, you may  need to be treated at a hospital. Treatment may include:  Taking supplements of iron, vitamin S31, or folic acid.  Taking a hormone medicine (erythropoietin) that can help to stimulate red blood cell growth.  Having a blood transfusion. This may be needed if you lose a lot of blood.  Making changes to your diet.  Having surgery to remove your spleen. Follow these instructions at home:  Take over-the-counter and prescription medicines only as told by your health care provider.  Take supplements only as told by your health care provider.  Follow any diet instructions that you were given.  Keep all follow-up visits as told by your health care provider. This is important. Contact a health care provider if:  You develop new bleeding anywhere in the body. Get help right away if:  You are very weak.  You are short of breath.  You have pain in your abdomen or chest.  You are dizzy or feel faint.  You have trouble concentrating.  You have bloody or black, tarry stools.  You vomit repeatedly or you vomit up blood. Summary  Anemia is a condition in which you do not have enough red blood cells or enough of a substance in your red blood cells that carries oxygen (hemoglobin).  Symptoms may occur suddenly or develop slowly.  If your anemia is mild, you may not have symptoms.  This condition is diagnosed with blood tests as well as a medical history and physical exam. Other tests may be needed.  Treatment for this condition depends on the cause of the anemia. This information is not intended to replace advice given to you by  your health care provider. Make sure you discuss any questions you have with your health care provider. Document Revised: 08/09/2017 Document Reviewed: 09/28/2016 Elsevier Patient Education  [employer].

## 2020-04-20 ENCOUNTER — Ambulatory Visit
Admission: RE | Admit: 2020-04-20 | Discharge: 2020-04-20 | Disposition: A | Discharge status: Home / Self Care | Payer: Medicare Other | Source: Ambulatory Visit | Attending: Internal Medicine | Admitting: Internal Medicine

## 2020-04-20 ENCOUNTER — Other Ambulatory Visit: Payer: Self-pay | Admitting: Internal Medicine

## 2020-04-20 DIAGNOSIS — E041 Nontoxic single thyroid nodule: Secondary | ICD-10-CM

## 2020-04-20 DIAGNOSIS — M810 Age-related osteoporosis without current pathological fracture: Secondary | ICD-10-CM

## 2020-04-22 LAB — CBC WITH DIFFERENTIAL/PLATELET
Absolute Monocytes: 528 cells/uL (ref 200–950)
Basophils Absolute: 48 cells/uL (ref 0–200)
Basophils Relative: 0.8 %
Eosinophils Absolute: 150 cells/uL (ref 15–500)
Eosinophils Relative: 2.5 %
HCT: 35.9 % (ref 35.0–45.0)
Hemoglobin: 11.4 g/dL — ABNORMAL LOW (ref 11.7–15.5)
Lymphs Abs: 1704 cells/uL (ref 850–3900)
MCH: 26.1 pg — ABNORMAL LOW (ref 27.0–33.0)
MCHC: 31.8 g/dL — ABNORMAL LOW (ref 32.0–36.0)
MCV: 82.2 fL (ref 80.0–100.0)
MPV: 11.4 fL (ref 7.5–12.5)
Monocytes Relative: 8.8 %
Neutro Abs: 3570 cells/uL (ref 1500–7800)
Neutrophils Relative %: 59.5 %
Platelets: 260 10*3/uL (ref 140–400)
RBC: 4.37 10*6/uL (ref 3.80–5.10)
RDW: 15.2 % — ABNORMAL HIGH (ref 11.0–15.0)
Total Lymphocyte: 28.4 %
WBC: 6 10*3/uL (ref 3.8–10.8)

## 2020-04-22 LAB — HEPATIC FUNCTION PANEL
AG Ratio: 2 (calc) (ref 1.0–2.5)
ALT: 17 U/L (ref 6–29)
AST: 16 U/L (ref 10–35)
Albumin: 4.3 g/dL (ref 3.6–5.1)
Alkaline phosphatase (APISO): 59 U/L (ref 37–153)
Bilirubin, Direct: 0.1 mg/dL (ref 0.0–0.2)
Globulin: 2.2 g/dL (calc) (ref 1.9–3.7)
Indirect Bilirubin: 0.6 mg/dL (calc) (ref 0.2–1.2)
Total Bilirubin: 0.7 mg/dL (ref 0.2–1.2)
Total Protein: 6.5 g/dL (ref 6.1–8.1)

## 2020-04-22 LAB — BASIC METABOLIC PANEL WITH GFR
BUN: 14 mg/dL (ref 7–25)
CO2: 27 mmol/L (ref 20–32)
Calcium: 9.5 mg/dL (ref 8.6–10.4)
Chloride: 105 mmol/L (ref 98–110)
Creat: 0.67 mg/dL (ref 0.60–0.93)
GFR, Est African American: 102 mL/min/{1.73_m2} (ref >=60)
GFR, Est Non African American: 88 mL/min/{1.73_m2} (ref >=60)
Glucose, Bld: 92 mg/dL (ref 65–99)
Potassium: 4.4 mmol/L (ref 3.5–5.3)
Sodium: 138 mmol/L (ref 135–146)

## 2020-04-22 LAB — LIPID PANEL
Cholesterol: 159 mg/dL (ref <200)
HDL: 69 mg/dL (ref >=50)
LDL Cholesterol (Calc): 77 mg/dL (calc)
Non-HDL Cholesterol (Calc): 90 mg/dL (calc) (ref <130)
Total CHOL/HDL Ratio: 2.3 (calc) (ref <5.0)
Triglycerides: 51 mg/dL (ref <150)

## 2020-04-22 LAB — VITAMIN B1: Vitamin B1 (Thiamine): 12 nmol/L (ref 8–30)

## 2020-04-22 LAB — VITAMIN D 25 HYDROXY (VIT D DEFICIENCY, FRACTURES): Vit D, 25-Hydroxy: 29 ng/mL — ABNORMAL LOW (ref 30–100)

## 2020-04-22 LAB — TSH: TSH: 1.83 mIU/L (ref 0.40–4.50)

## 2020-04-22 LAB — IRON: Iron: 68 ug/dL (ref 45–160)

## 2020-04-22 LAB — FOLATE: Folate: 16.5 ng/mL

## 2020-04-22 LAB — VITAMIN B12: Vitamin B-12: 923 pg/mL (ref 200–1100)

## 2020-04-22 LAB — FERRITIN: Ferritin: 8 ng/mL — ABNORMAL LOW (ref 16–288)

## 2020-04-22 LAB — RETICULOCYTES
ABS Retic: 52440 cells/uL (ref 20000–8000)
Retic Ct Pct: 1.2 %

## 2020-04-24 ENCOUNTER — Encounter: Payer: Self-pay | Admitting: Internal Medicine

## 2020-04-24 DIAGNOSIS — E611 Iron deficiency: Secondary | ICD-10-CM | POA: Insufficient documentation

## 2020-04-24 MED ORDER — CHOLECALCIFEROL 50 MCG (2000 UT) PO TABS: 1.0000 | ORAL_TABLET | Freq: Every day | ORAL | 1 refills | Status: AC

## 2020-04-24 MED ORDER — FERRAPLUS 90 90-1 MG PO TABS: 1.0000 | ORAL_TABLET | Freq: Every day | ORAL | 1 refills | Status: DC

## 2020-04-28 ENCOUNTER — Encounter: Payer: Self-pay | Admitting: Internal Medicine

## 2020-05-03 DIAGNOSIS — M7542 Impingement syndrome of left shoulder: Secondary | ICD-10-CM | POA: Diagnosis not present

## 2020-05-05 ENCOUNTER — Ambulatory Visit (INDEPENDENT_AMBULATORY_CARE_PROVIDER_SITE_OTHER): Payer: Medicare Other | Admitting: Otolaryngology

## 2020-05-05 ENCOUNTER — Encounter (INDEPENDENT_AMBULATORY_CARE_PROVIDER_SITE_OTHER): Payer: Self-pay | Admitting: Otolaryngology

## 2020-05-05 ENCOUNTER — Other Ambulatory Visit: Payer: Self-pay

## 2020-05-05 VITALS — Temp 97.3°F

## 2020-05-05 DIAGNOSIS — E041 Nontoxic single thyroid nodule: Secondary | ICD-10-CM | POA: Diagnosis not present

## 2020-05-05 NOTE — Progress Notes (Signed)
HPI: Madison Rowe is a 69 y.o. female who presents is referred by Dr. Ronnald Ramp for evaluation of left pulmonary nodule.  Apparently patient was in [place] where she had some shortness of breath and weakness after taking one of her husbands diuretics because of swelling in her ankles.  She ultimately had a CT scan to rule out PE.  This demonstrated a left thyroid nodule.  Ultrasound was performed here on follow-up that demonstrated a 2.4 cm mid left thyroid nodule that was solid and warranted FNA.  She is otherwise asymptomatic.  She has no hoarseness.  No difficulty with swallowing..  Past Medical History:  Diagnosis Date  . Allergy   . Anemia   . Arthritis   . Osteoporosis 2010  . Vitamin D deficient osteomalacia 2010   Past Surgical History:  Procedure Laterality Date  . BREAST SURGERY    . ECTOPIC PREGNANCY SURGERY    . FOOT SURGERY Left   . TONSILLECTOMY     Social History   Socioeconomic History  . Marital status: Married    Spouse name: Not on file  . Number of children: Not on file  . Years of education: Not on file  . Highest education level: Not on file  Occupational History  . Not on file  Tobacco Use  . Smoking status: Never Smoker  . Smokeless tobacco: Never Used  Substance and Sexual Activity  . Alcohol use: Yes    Comment: Occasional  . Drug use: No  . Sexual activity: Yes    Birth control/protection: Surgical  Other Topics Concern  . Not on file  Social History Narrative  . Not on file   Social Determinants of Health   Financial Resource Strain:   . Difficulty of Paying Living Expenses: Not on file  Food Insecurity:   . Worried About Charity fundraiser in the Last Year: Not on file  . Ran Out of Food in the Last Year: Not on file  Transportation Needs:   . Lack of Transportation (Medical): Not on file  . Lack of Transportation (Non-Medical): Not on file  Physical Activity:   . Days of Exercise per Week: Not on file  . Minutes of Exercise per  Session: Not on file  Stress:   . Feeling of Stress : Not on file  Social Connections:   . Frequency of Communication with Friends and Family: Not on file  . Frequency of Social Gatherings with Friends and Family: Not on file  . Attends Religious Services: Not on file  . Active Member of Clubs or Organizations: Not on file  . Attends Archivist Meetings: Not on file  . Marital Status: Not on file   Family History  Problem Relation Age of Onset  . Kidney disease Father   . Kidney disease Maternal Grandfather   . Hypertension Mother   . Cancer Neg Hx   . Heart disease Neg Hx   . Colon cancer Neg Hx   . Esophageal cancer Neg Hx   . Rectal cancer Neg Hx   . Stomach cancer Neg Hx    Allergies  Allergen Reactions  . Alendronate Sodium [Alendronate Sodium]     nausea  . Olmesartan Nausea And Vomiting  . Codeine Nausea And Vomiting   Prior to Admission medications   Medication Sig Start Date End Date Taking? Authorizing Provider  atorvastatin (LIPITOR) 10 MG tablet TAKE 1 TABLET(10 MG) BY MOUTH DAILY 11/27/19  Yes Janith Lima, MD  Cholecalciferol  50 MCG (2000 UT) TABS Take 1 tablet (2,000 Units total) by mouth daily. 04/24/20  Yes Janith Lima, MD  clobetasol cream (TEMOVATE) 0.05 % APPLY EXTERNALLY TO THE AFFECTED AREA TWICE DAILY 12/08/19  Yes Janith Lima, MD  conjugated estrogens (PREMARIN) vaginal cream Place vaginally as directed. 2-3 times weekly.   Yes [provider]  Dietary Management Product (FOSTEUM PLUS) CAPS TAKE ONE CAPSULE BY MOUTH TWICE DAILY 04/20/20  Yes Janith Lima, MD  Iron-Folic [id number] (FERRAPLUS 90) 90-1 MG TABS Take 1 tablet by mouth daily. 04/24/20  Yes Janith Lima, MD  meloxicam (MOBIC) 7.5 MG tablet Take 7.5 mg by mouth daily.   Yes [provider]     Positive ROS: Otherwise negative  All other systems have been reviewed and were otherwise negative with the exception of those mentioned in the HPI  and as above.  Physical Exam: Constitutional: Alert, well-appearing, no acute distress Ears: External ears without lesions or tenderness. Ear canals are clear bilaterally with intact, clear TMs.  Nasal: External nose without lesions. Clear nasal passages Oral: Lips and gums without lesions. Tongue and palate mucosa without lesions. Posterior oropharynx clear. Neck: No palpable adenopathy or masses.  No palpable adenopathy in the neck.  The left mid thyroid nodule is barely palpable.  No supraclavicular adenopathy noted. Respiratory: Breathing comfortably  Skin: No facial/neck lesions or rash noted.  Procedures  Assessment: Left thyroid nodule that warrants FNA per ultrasound TiRads 3  Plan: We will plan on scheduling patient for ultrasound-guided FNA of the left thyroid nodule. Patient will call us concerning results of the FNA 5 to 7 days post biopsy.   Radene Journey, MD   CC:

## 2020-05-06 ENCOUNTER — Other Ambulatory Visit (INDEPENDENT_AMBULATORY_CARE_PROVIDER_SITE_OTHER): Payer: Self-pay

## 2020-05-06 DIAGNOSIS — D440 Neoplasm of uncertain behavior of thyroid gland: Secondary | ICD-10-CM

## 2020-05-12 ENCOUNTER — Ambulatory Visit (INDEPENDENT_AMBULATORY_CARE_PROVIDER_SITE_OTHER): Payer: Medicare Other

## 2020-05-12 ENCOUNTER — Ambulatory Visit (INDEPENDENT_AMBULATORY_CARE_PROVIDER_SITE_OTHER): Payer: Medicare Other | Admitting: Internal Medicine

## 2020-05-12 ENCOUNTER — Other Ambulatory Visit: Payer: Self-pay

## 2020-05-12 ENCOUNTER — Encounter: Payer: Self-pay | Admitting: Internal Medicine

## 2020-05-12 DIAGNOSIS — R0600 Dyspnea, unspecified: Secondary | ICD-10-CM

## 2020-05-12 DIAGNOSIS — R0602 Shortness of breath: Secondary | ICD-10-CM | POA: Diagnosis not present

## 2020-05-12 DIAGNOSIS — R0609 Other forms of dyspnea: Secondary | ICD-10-CM

## 2020-05-12 NOTE — Progress Notes (Signed)
Madison Rowe, female    DOB: 12/28/1950     MRN: [id number]   Brief patient profile:  84 yowf never smoker grew up near [place] with tendency to spring > fall itchy nose/ sneezy eval in college > allergy shots x 10 years but still felt for clariton in the seasons which worked well but stopped heavy physical activity at onset covid March 2020 still able to do hills at [facility] then in July 2021 L ankle swelling > took  HCTZ from  husband  > chills, aches, sweats, fainty > to [facility] 8/28 rx "just  IV fluids "  And symptoms  100% resolved as did the L ankle swelling but CT chest c/w GG changes so referred to pulmonary clinic 05/12/2020 by Dr  Madison Rowe who mentioned that she had been anemic previously.     History of Present Illness  05/12/2020  Pulmonary/ 1st office eval/[name]  Chief Complaint  Patient presents with  . Pulmoary Consult    Referred by Dr. Scarlette Rowe for abnormal CT Chest--ground glass opacities. Pt states she has been noticing some DOE with walking up an incline.   Dyspnea:  Not limited by breathing from desired activities now though avoids steps Cough: none  Sleep: flat bed / one pillow / better p fluticosone sev nocts / week SABA use: none  02 :  None   No obvious day to day or daytime variability or assoc excess/ purulent sputum or mucus plugs or hemoptysis or cp or chest tightness, subjective wheeze or overt sinus or hb symptoms.   Sleeping ok now  without nocturnal  or early am exacerbation  of respiratory  c/o's or need for noct saba. Also denies any obvious fluctuation of symptoms with weather or environmental changes or other aggravating or alleviating factors except as outlined above   No unusual exposure hx or h/o childhood pna/ asthma or knowledge of premature birth.  Current Allergies, Complete Past Medical History, Past Surgical History, Family History, and Social History were reviewed in [employer] record.  ROS   The following are not active complaints unless bolded Hoarseness, sore throat, dysphagia, dental problems, itching, sneezing,  nasal congestion or discharge of excess mucus or purulent secretions, ear ache,   fever, chills, sweats, unintended wt loss or wt gain, classically pleuritic or exertional cp,  orthopnea pnd or arm/hand swelling  or leg swelling resolved, presyncope, palpitations, abdominal pain, anorexia, nausea, vomiting, diarrhea  or change in bowel habits or change in bladder habits, change in stools or change in urine, dysuria, hematuria,  rash, arthralgias, visual complaints, headache, numbness, weakness or ataxia or problems with walking or coordination,  change in mood or  memory.            Past Medical History:  Diagnosis Date  . Allergy   . Anemia   . Arthritis   . Osteoporosis 2010  . Vitamin D deficient osteomalacia 2010    Outpatient Medications Prior to Visit  Medication Sig Dispense Refill  . atorvastatin (LIPITOR) 10 MG tablet TAKE 1 TABLET(10 MG) BY MOUTH DAILY 90 tablet 1  . Cholecalciferol 50 MCG (2000 UT) TABS Take 1 tablet (2,000 Units total) by mouth daily. 90 tablet 1  . clobetasol cream (TEMOVATE) 0.05 % APPLY EXTERNALLY TO THE AFFECTED AREA TWICE DAILY 60 g 1  . conjugated estrogens (PREMARIN) vaginal cream Place vaginally as directed. 2-3 times weekly.    . Dietary Management Product (FOSTEUM PLUS) CAPS  TAKE ONE CAPSULE BY MOUTH TWICE DAILY 180 capsule 1  . Iron-Folic [id number] (FERRAPLUS 90) 90-1 MG TABS Take 1 tablet by mouth daily. 90 tablet 1  . meloxicam (MOBIC) 7.5 MG tablet Take 7.5 mg by mouth daily.     No facility-administered medications prior to visit.     Objective:     BP 132/84 (BP Location: Left Arm, Cuff Size: Normal)   Pulse 91   Temp (!) 97.4 F (36.3 C) (Temporal)   Ht 5\' 4"  (1.626 m)   Wt 195 lb (88.5 kg)   SpO2 98% Comment: on RA  BMI 33.47 kg/m   SpO2: 98 % (on RA)   Pleasant mod obese amb wf nad    HEENT : pt wearing mask not removed for exam due to covid -19 concerns.    NECK :  without JVD/Nodes/TM/ nl carotid upstrokes bilaterally   LUNGS: no acc muscle use,  Nl contour chest which is clear to A and P bilaterally without cough on insp or exp maneuvers   CV:  RRR  no s3 or murmur or increase in P2, and no edema   ABD:  soft and nontender with nl inspiratory excursion in the supine position. No bruits or organomegaly appreciated, bowel sounds nl  MS:  Nl gait/ ext warm without deformities, calf tenderness, cyanosis or clubbing No obvious joint restrictions   SKIN: warm and dry without lesions    NEURO:  alert, approp, nl sensorium with  no motor or cerebellar deficits apparent.    CXR PA and Lateral:   05/12/2020 :    I personally reviewed images and agree with radiology impression as follows:    No active disease. Aortic atherosclerosis. Right breast calcification.         Labs ordered/ reviewed:      Chemistry      Component Value Date/Time   NA 138 04/19/2020 1103   NA 138 03/24/2018 0000   K 4.4 04/19/2020 1103   CL 105 04/19/2020 1103   CO2 27 04/19/2020 1103   BUN 14 04/19/2020 1103   BUN 15 03/24/2018 0000   CREATININE 0.67 04/19/2020 1103   GLU 94 03/24/2018 0000      Component Value Date/Time   CALCIUM 9.5 04/19/2020 1103   ALKPHOS 54 03/07/2020 1105   AST 16 04/19/2020 1103   ALT 17 04/19/2020 1103   BILITOT 0.7 04/19/2020 1103        Lab Results  Component Value Date   WBC 6.0 04/19/2020   HGB 11.4 (L) 04/19/2020   HCT 35.9 04/19/2020   MCV 82.2 04/19/2020   PLT 260 04/19/2020       EOS                                                              150                                    05/12/2020     Lab Results  Component Value Date   TSH 1.83 04/19/2020     Lab Results  Component Value Date   PROBNP 96.0 05/12/2020       Lab Results  Component Value Date  ESRSEDRATE 9 05/12/2020     CTa  05/07/20 no pe/ bilateral GG  changes with trace bilateral  effusions and thickening of brochovasular bundles   Left venous dopplers 05/07/20 neg though limited study     Assessment   No problem-specific Assessment & Plan notes found for this encounter.     Madison Gully, MD 05/12/2020

## 2020-05-12 NOTE — Patient Instructions (Signed)
If any question at all > check your 02 saturation at peak exercise (pulse oximeter) to see if staying  Well above 90%  Please remember to go to the lab and x-ray department   for your tests - we will call you with the results when they are available.    Pulmonary follow up will be as needed

## 2020-05-13 ENCOUNTER — Other Ambulatory Visit: Payer: Self-pay | Admitting: Internal Medicine

## 2020-05-13 ENCOUNTER — Encounter: Payer: Self-pay | Admitting: Internal Medicine

## 2020-05-13 DIAGNOSIS — R0609 Other forms of dyspnea: Secondary | ICD-10-CM

## 2020-05-13 LAB — BRAIN NATRIURETIC PEPTIDE: Pro B Natriuretic peptide (BNP): 96 pg/mL (ref 0.0–100.0)

## 2020-05-13 LAB — SEDIMENTATION RATE: Sed Rate: 9 mm/hr (ref 0–30)

## 2020-05-13 NOTE — Progress Notes (Signed)
Spoke with pt and notified of results per Dr. Wert. Pt verbalized understanding and denied any questions. 

## 2020-05-13 NOTE — Assessment & Plan Note (Addendum)
CTa CTa  05/07/20 no pe/ bilateral GG changes with trace bilateral  effusions and thickening of brochovasular bundles in setting of peripheral edema/ resolved  Episode sure sounds like mild chf that has resolved now and there is prominence of LV on lateral view so I suspect she has a least some  diastolic dysfunction and note there was some h/o anemia for which pt says she was treated with iron and is now feeling a lot better (anemia and vol overload would synergistically lead to cardiac decomp if she does have diastolic dysfunction ) .  With a nl cxr now and nl walking sats all I would recommend is that she start walking more regularly at a submax level and monitor her 02 sats over time but no do not recommend additional CT studies or pulmonary f/u and low threshold to do Echo if not already done outside the Epic system.   Pulmonary f/u is prn            Each maintenance medication was reviewed in detail including emphasizing most importantly the difference between maintenance and prns and under what circumstances the prns are to be triggered using an action plan format where appropriate.  Total time for H and P, chart review, counseling, directly observing portions of ambulatory 02 saturation study/  and generating customized AVS unique to this office visit / charting = 35 min       .

## 2020-05-18 ENCOUNTER — Other Ambulatory Visit (HOSPITAL_COMMUNITY)
Admission: RE | Admit: 2020-05-18 | Discharge: 2020-05-18 | Disposition: A | Discharge status: Home / Self Care | Payer: Medicare Other | Source: Ambulatory Visit | Attending: Otolaryngology | Admitting: Otolaryngology

## 2020-05-18 ENCOUNTER — Ambulatory Visit
Admission: RE | Admit: 2020-05-18 | Discharge: 2020-05-18 | Disposition: A | Discharge status: Home / Self Care | Payer: Medicare Other | Source: Ambulatory Visit | Attending: Otolaryngology | Admitting: Otolaryngology

## 2020-05-18 ENCOUNTER — Inpatient Hospital Stay (HOSPITAL_COMMUNITY): Admission: RE | Admit: 2020-05-18 | Payer: Medicare Other | Source: Ambulatory Visit

## 2020-05-18 DIAGNOSIS — D440 Neoplasm of uncertain behavior of thyroid gland: Secondary | ICD-10-CM

## 2020-05-18 DIAGNOSIS — E041 Nontoxic single thyroid nodule: Secondary | ICD-10-CM | POA: Insufficient documentation

## 2020-05-19 LAB — CYTOLOGY - NON PAP

## 2020-05-27 NOTE — Telephone Encounter (Signed)
Called patient concerning FNA of left thyroid nodule.  Results demonstrated benign follicular nodule.  Bethesda category II. Reviewed with her that this is consistent with a benign nodule and nothing further is required unless it enlarges and becomes symptomatic.

## 2020-06-06 ENCOUNTER — Institutional Professional Consult (permissible substitution): Payer: Medicare Other | Admitting: Pulmonary Disease

## 2020-06-08 ENCOUNTER — Ambulatory Visit (HOSPITAL_COMMUNITY): Payer: Medicare Other | Attending: Internal Medicine

## 2020-06-08 ENCOUNTER — Other Ambulatory Visit: Payer: Self-pay

## 2020-06-08 DIAGNOSIS — R0600 Dyspnea, unspecified: Secondary | ICD-10-CM | POA: Diagnosis not present

## 2020-06-08 DIAGNOSIS — R0609 Other forms of dyspnea: Secondary | ICD-10-CM

## 2020-06-08 LAB — ECHOCARDIOGRAM COMPLETE
Area-P 1/2: 2.88 cm2
MV M vel: 5.65 m/s
MV Peak grad: 127.7 mmHg
Radius: 0.2 cm
S' Lateral: 3.1 cm

## 2020-06-09 NOTE — Progress Notes (Signed)
Spoke with pt and notified of results per Dr. Melvyn Novas. Pt verbalized understanding and denied any questions.  Dr Silas Flood is booked through Oct and his Nov schedule not open yet so I have placed a reminder for appt with him.

## 2020-06-13 ENCOUNTER — Other Ambulatory Visit: Payer: Self-pay | Admitting: Internal Medicine

## 2020-06-13 DIAGNOSIS — E785 Hyperlipidemia, unspecified: Secondary | ICD-10-CM

## 2020-06-15 DIAGNOSIS — Z23 Encounter for immunization: Secondary | ICD-10-CM | POA: Diagnosis not present

## 2020-06-15 DIAGNOSIS — M7542 Impingement syndrome of left shoulder: Secondary | ICD-10-CM | POA: Diagnosis not present

## 2020-07-19 DIAGNOSIS — Z23 Encounter for immunization: Secondary | ICD-10-CM | POA: Diagnosis not present

## 2020-08-02 ENCOUNTER — Encounter: Payer: Self-pay | Admitting: Internal Medicine

## 2020-08-03 ENCOUNTER — Other Ambulatory Visit: Payer: Self-pay | Admitting: Internal Medicine

## 2020-08-03 DIAGNOSIS — E611 Iron deficiency: Secondary | ICD-10-CM

## 2020-08-03 MED ORDER — FERRAPLUS 90 90-1 MG PO TABS: 1.0000 | ORAL_TABLET | Freq: Every day | ORAL | 1 refills | Status: DC

## 2020-08-08 ENCOUNTER — Other Ambulatory Visit: Payer: Self-pay

## 2020-08-08 ENCOUNTER — Ambulatory Visit (INDEPENDENT_AMBULATORY_CARE_PROVIDER_SITE_OTHER): Payer: Medicare Other | Admitting: Pulmonary Disease

## 2020-08-08 ENCOUNTER — Encounter: Payer: Self-pay | Admitting: Pulmonary Disease

## 2020-08-08 VITALS — BP 124/78 | HR 85 | Temp 97.6°F | Ht 64.0 in | Wt 199.6 lb

## 2020-08-08 DIAGNOSIS — R0600 Dyspnea, unspecified: Secondary | ICD-10-CM | POA: Diagnosis not present

## 2020-08-08 DIAGNOSIS — I272 Pulmonary hypertension, unspecified: Secondary | ICD-10-CM | POA: Diagnosis not present

## 2020-08-08 DIAGNOSIS — R0609 Other forms of dyspnea: Secondary | ICD-10-CM

## 2020-08-08 MED ORDER — FUROSEMIDE 20 MG PO TABS: 20.0000 mg | ORAL_TABLET | Freq: Every day | ORAL | 11 refills | Status: DC

## 2020-08-08 NOTE — Patient Instructions (Signed)
Nice to meet you  Take lasix 20 mg daily in the morning. This is to help remove excess water or fluid as seen on prior CT scan and recent heart ultrasound.  Please weigh yourself each morning after using the bathroom.   We will get labs today and early next week - no need for appointment for labs - check in at front desk and tell them labs are ordered.  We will get breathing tests in the coming weeks to make sure we are not missing anything.  I will refer to the heart doctor given the leaky mitral valve seen on the heart ultrasound.   Follow up with Madison Rowe in 8 weeks.

## 2020-08-09 LAB — BASIC METABOLIC PANEL
BUN: 21 mg/dL (ref 6–23)
CO2: 31 mEq/L (ref 19–32)
Calcium: 9.7 mg/dL (ref 8.4–10.5)
Chloride: 102 mEq/L (ref 96–112)
Creatinine, Ser: 0.82 mg/dL (ref 0.40–1.20)
GFR: 71.81 mL/min (ref >60.00)
Glucose, Bld: 92 mg/dL (ref 70–99)
Potassium: 4.2 mEq/L (ref 3.5–5.1)
Sodium: 139 mEq/L (ref 135–145)

## 2020-08-09 LAB — MAGNESIUM: Magnesium: 2.2 mg/dL (ref 1.5–2.5)

## 2020-08-13 NOTE — Progress Notes (Addendum)
@Patient  ID: Madison Rowe, female    DOB: 10-Aug-1951, 69 y.o.   MRN: [id number]  Chief Complaint  Patient presents with  . Follow-up    shortness of breath with exertion especially climbing stairs any incline,     Referring provider: Janith Lima, MD  HPI:    69 year old woman with shortness of breath recently seen by my pulmonary colleague who I am seeing in consultation at the request of Regan Lemming, MD for evaluation of pulmonary hypertension discovered on echocardiogram.  Notes from referring provider as well as PCP reviewed.  Patient chief complaint dyspnea on exertion.  Present for the last year or so.  Gradually worsened maybe over the last few months.  She notes in July 2021 she was at her second home near [place].  She does lower extremity swelling and took one of her husband's hydrochlorothiazide thinking it was a fluid pill.  Shortly thereafter she felt dizzy, lightheaded.  She went to local ED and they did imaging including a CT scan with contrast to eval for PE which was negative for clot, lower extremity Dopplers were negative for DVT.  CT scan showed per report bilateral pleural effusions as well as bilateral groundglass opacities and thickened of the bronchovascular bundles read as pulmonary edema.  No further work-up was done for this.  Daily, there is initially discovered thyroid nodule that CT scan for which she underwent thyroid biopsy in the ED.  She has ongoing dyspnea on exertion was referred to pulmonary.  Recently seen by one of my colleagues.  TTE was ordered and performed 06/08/2020 which is personally reviewed and demonstrates mild elevation of PA pressures, preserved RV function, mildly dilated RA, estimated RA pressure of 15 mmHg.  This also demonstrated mild to moderate mitral valve regurg.  She has not taken any fluid pills per her report.  Chest x-ray 05/13/2020 reviewed and interpreted as clear lungs,.  PMH: Hyperlipidemia, thyroid nodule Surgical history:  Thyroid biopsy Family history: Denies significant respiratory issues in first-degree relatives Social history: Never smoker, lives in [place], a second home at [place] spends a good amount of time there each year   Questionaires / Pulmonary Flowsheets:   ACT:  No flowsheet data found.  MMRC: No flowsheet data found.  Epworth:  No flowsheet data found.  Tests:   FENO:  No results found for: NITRICOXIDE  PFT: No flowsheet data found.  WALK:  SIX MIN WALK 05/12/2020  Supplimental Oxygen during Test? (L/min) No  Tech Comments: fast pace/no SOB//lmr    Imaging: No results found.  Lab Results: Personally reviewed, normal renal function, highest absolute eosinophil count of 150 noted 04/19/2020, BNP normal 96 05/12/2020 CBC    Component Value Date/Time   WBC 6.0 04/19/2020 1103   RBC 4.37 04/19/2020 1103   HGB 11.4 (L) 04/19/2020 1103   HCT 35.9 04/19/2020 1103   PLT 260 04/19/2020 1103   MCV 82.2 04/19/2020 1103   MCH 26.1 (L) 04/19/2020 1103   MCHC 31.8 (L) 04/19/2020 1103   RDW 15.2 (H) 04/19/2020 1103   LYMPHSABS 1,704 04/19/2020 1103   MONOABS 0.6 03/07/2020 1105   EOSABS 150 04/19/2020 1103   BASOSABS 48 04/19/2020 1103    BMET    Component Value Date/Time   NA 139 08/08/2020 1641   NA 138 03/24/2018 0000   K 4.2 08/08/2020 1641   CL 102 08/08/2020 1641   CO2 31 08/08/2020 1641   GLUCOSE 92 08/08/2020 1641   BUN 21 08/08/2020  1641   BUN 15 03/24/2018 0000   CREATININE 0.82 08/08/2020 1641   CREATININE 0.67 04/19/2020 1103   CALCIUM 9.7 08/08/2020 1641   GFRNONAA 88 04/19/2020 1103   GFRAA 102 04/19/2020 1103    BNP No results found for: BNP  ProBNP    Component Value Date/Time   PROBNP 96.0 05/12/2020 1623    Specialty Problems      Pulmonary Problems   ALLERGIC RHINITIS   DOE (dyspnea on exertion)    CTa CTa  05/07/20 no pe/ bilateral GG changes with trace bilateral  effusions and thickening of brochovasular bundles in setting of  peripheral edema/ resolved  -   05/12/2020   Walked RA  2 laps @ approx 259ft each @ fast pace  stopped due to end of study,  no sob   - sats at end 95%  -  Echo 06/08/20 : entricular ejection fraction, by estimation, is 60 to 65%. The left ventricle has normal function. The left ventricle has no regional wall motion abnormalities. Left ventricular diastolic parameters are consistent with Grade I diastolic dysfunction (impaired relaxation).  2. Right ventricular systolic function is normal. The right ventricular size is normal. There is moderately elevated pulmonary artery systolic pressure. The estimated right ventricular systolic pressure is 54.6 mmHg.  3. Right atrial size was mildly dilated.  4. 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 Mild to moderate mitral valve regurgitation. No evidence of mitral stenosis.  5. The aortic valve is tricuspid. There is mild thickening of the aortic valve. Aortic valve regurgitation is not visualized. No aortic stenosis is present.  6. The inferior vena cava is dilated in size with <50% respiratory variability, suggesting right atrial pressure of 15 mmHg > refer to Dr Silas Flood         Allergies  Allergen Reactions  . Alendronate Sodium [Alendronate Sodium]     nausea  . Olmesartan Nausea And Vomiting  . Codeine Nausea And Vomiting    Immunization History  Administered Date(s) Administered  . Fluad Quad(high Dose 65+) 06/16/2019  . Influenza, High Dose Seasonal PF 07/11/2016, 06/17/2017, 07/19/2020  . Influenza-Unspecified 06/21/2014, 07/30/2018  . PFIZER SARS-COV-2 Vaccination 09/30/2019, 10/22/2019, 06/15/2020  . Pneumococcal Conjugate-13 07/11/2016  . Pneumococcal Polysaccharide-23 07/11/2017  . Tdap 09/20/2010  . Zoster 10/19/2009  . Zoster Recombinat (Shingrix) 01/21/2017, 07/11/2017    Past Medical History:  Diagnosis Date  . Allergy   . Anemia   . Arthritis   .  Osteoporosis 2010  . Vitamin D deficient osteomalacia 2010    Tobacco History: Social History   Tobacco Use  Smoking Status Never Smoker  Smokeless Tobacco Never Used   Counseling given: Not Answered   Continue to not smoke  Outpatient Encounter Medications as of 08/08/2020  Medication Sig  . atorvastatin (LIPITOR) 10 MG tablet TAKE 1 TABLET(10 MG) BY MOUTH DAILY  . Cholecalciferol 50 MCG (2000 UT) TABS Take 1 tablet (2,000 Units total) by mouth daily.  . clobetasol cream (TEMOVATE) 0.05 % APPLY EXTERNALLY TO THE AFFECTED AREA TWICE DAILY  . conjugated estrogens (PREMARIN) vaginal cream Place vaginally as directed. 2-3 times weekly.  . Dietary Management Product (FOSTEUM PLUS) CAPS TAKE ONE CAPSULE BY MOUTH TWICE DAILY  . Ibuprofen 200 MG CAPS Take 200 mg by mouth in the morning and at bedtime. 800mg  in the morning 600mg  in the evening  . Iron-Folic [id number] (FERRAPLUS 90) 90-1 MG TABS Take 1 tablet by mouth daily.  Marland Kitchen  furosemide (LASIX) 20 MG tablet Take 1 tablet (20 mg total) by mouth daily.   No facility-administered encounter medications on file as of 08/08/2020.     Review of Systems  Review of Systems  No orthopnea or PND.  Notices intermittent lower extremity swelling.  Comprehensive review of systems otherwise negative. Physical Exam  BP 124/78   Pulse 85   Temp 97.6 F (36.4 C) (Temporal)   Ht 5\' 4"  (1.626 m)   Wt 199 lb 9.6 oz (90.5 kg)   SpO2 95%   BMI 34.26 kg/m   Wt Readings from Last 5 Encounters:  08/08/20 199 lb 9.6 oz (90.5 kg)  05/12/20 195 lb (88.5 kg)  04/19/20 195 lb (88.5 kg)  03/07/20 200 lb (90.7 kg)  03/09/19 189 lb (85.7 kg)    BMI Readings from Last 5 Encounters:  08/08/20 34.26 kg/m  05/12/20 33.47 kg/m  04/19/20 33.47 kg/m  03/07/20 34.33 kg/m  03/09/19 32.44 kg/m     Physical Exam General: Well-appearing, in no acute distress Eyes: EOMI, no icterus Neck: JVP elevated to 3 cm above clavicle sitting upright,  supple Respiratory: Clear to auscultation bilaterally, no wheezes Cardiovascular: Regular rate rhythm, 2 out of 6 late systolic murmur heard best at left upper sternal border, trace bilateral lower extremity swelling to ankles Abdomen: Mild distention, nontender, bowel sounds present MSK: No synovitis, joint effusion Neuro: Normal gait, no weakness Psych: Normal mood, full affect   Assessment & Plan:   Presumed pulmonary hypertension: Based on mildly elevated PASP pressure in the setting of elevated right atrial pressure on recent TTE.  Highest concern for group 2 disease given mitral valve regurgitation seen on TTE.  No obvious risk factors for group 3 disease.  Discussed role of sleep apnea testing which she has precontemplative about using CPAP mask but will rediscuss at upcoming visits.  No signs or symptoms of connective tissue disease and no family history of the same.  No methamphetamine or other illicit substance use.  No history of DVT or PE to suggest chronic thromboembolic disease.  Given mild volume overload with elevated JVP on exam as well as elevated right atrial pressure on recent TTE, will institute Lasix therapy, 20 mg oral daily.  Will check electrolytes today.  Requires intensive lab monitoring with repeat labs in the coming days which she was counseled and agrees to.  We will plan to repeat TTE in 3 months.  If elevated PA pressures remain, will encourage testing for sleep apnea and discusse role of right heart catheterization.   Dyspnea on exertion: Likely multifactorial.  Contributors include possible pulmonary hypertension, mitral valve regurgitation worsened with exertion, possible other pulmonary etiologies such as asthma given atopic symptoms although no evidence of parenchymal disease on imaging.  Return in about 8 weeks (around 10/03/2020).   Lanier Clam, MD 08/13/2020

## 2020-08-15 ENCOUNTER — Other Ambulatory Visit (INDEPENDENT_AMBULATORY_CARE_PROVIDER_SITE_OTHER): Payer: Medicare Other

## 2020-08-15 DIAGNOSIS — I272 Pulmonary hypertension, unspecified: Secondary | ICD-10-CM | POA: Diagnosis not present

## 2020-08-15 LAB — BASIC METABOLIC PANEL
BUN: 20 mg/dL (ref 6–23)
CO2: 31 mEq/L (ref 19–32)
Calcium: 9.5 mg/dL (ref 8.4–10.5)
Chloride: 100 mEq/L (ref 96–112)
Creatinine, Ser: 0.7 mg/dL (ref 0.40–1.20)
GFR: 86.81 mL/min (ref >60.00)
Glucose, Bld: 78 mg/dL (ref 70–99)
Potassium: 3.8 mEq/L (ref 3.5–5.1)
Sodium: 139 mEq/L (ref 135–145)

## 2020-08-15 LAB — MAGNESIUM: Magnesium: 2 mg/dL (ref 1.5–2.5)

## 2020-08-16 NOTE — Progress Notes (Signed)
Labs are stable and normal after starting lasix. Thanks!

## 2020-08-17 ENCOUNTER — Ambulatory Visit (INDEPENDENT_AMBULATORY_CARE_PROVIDER_SITE_OTHER): Payer: Medicare Other | Admitting: Internal Medicine

## 2020-08-17 ENCOUNTER — Other Ambulatory Visit: Payer: Self-pay

## 2020-08-17 ENCOUNTER — Encounter: Payer: Self-pay | Admitting: Internal Medicine

## 2020-08-17 ENCOUNTER — Encounter: Payer: Self-pay | Admitting: *Deleted

## 2020-08-17 VITALS — BP 100/70 | HR 70 | Ht 64.0 in | Wt 198.0 lb

## 2020-08-17 DIAGNOSIS — I10 Essential (primary) hypertension: Secondary | ICD-10-CM

## 2020-08-17 DIAGNOSIS — L301 Dyshidrosis [pompholyx]: Secondary | ICD-10-CM | POA: Diagnosis not present

## 2020-08-17 DIAGNOSIS — E7849 Other hyperlipidemia: Secondary | ICD-10-CM | POA: Diagnosis not present

## 2020-08-17 DIAGNOSIS — I272 Pulmonary hypertension, unspecified: Secondary | ICD-10-CM

## 2020-08-17 DIAGNOSIS — I340 Nonrheumatic mitral (valve) insufficiency: Secondary | ICD-10-CM

## 2020-08-17 NOTE — Progress Notes (Signed)
Cardiology Office Note:    Date:  08/17/2020   ID:  Madison Rowe, DOB [birth year], MRN [id number]  PCP:  Janith Lima, MD  [place] Cardiologist:  No primary care provider on file.  [facility] Electrophysiologist:  None   CC: PH and MR Eval Consulted for the evaluation of DOE at the behest of Janith Lima, MD  History of Present Illness:    Madison Rowe is a 69 y.o. female with a hx of HLD, Obesity, Possible Eczema who presents for evaluation.  Patient notes that in July 2021 she was having DOE more than she has expected, possible related to HCTZ use for LE edema.  She went to see her team in [place] for evaluation:  Received a CXR that noted pulmonary edema; had a CT scan notable for GGOs but negative for PE.  When returning to [place], received echo and had mitral valve prolapse with mild to moderate MR and elevated PASP.  With this, saw Pulmonology and was started on lasix.  Through this, patient notes that she is feeling fine.  Has had no chest pain, chest pressure, chest tightness, chest stinging, even when walking up and down hills in [place].  Notes no discomfort with exertion, and no change in how she feels with the 20 mg PO lasix; outside of increased urination. No shortness of breath. No PND or orthopnea.  No bendopnea, notes that her weight is stable, and denies leg swelling , or abdominal swelling.  No syncope or near syncope.  Notes no palpitations or funny heart beats.     Ambulatory BP 125/85.   Past Medical History:  Diagnosis Date  . Allergy   . Anemia   . Arthritis   . Osteoporosis 2010  . Vitamin D deficient osteomalacia 2010    Past Surgical History:  Procedure Laterality Date  . BREAST SURGERY    . ECTOPIC PREGNANCY SURGERY    . FOOT SURGERY Left   . TONSILLECTOMY      Current Medications: Current Meds  Medication Sig  . atorvastatin (LIPITOR) 10 MG tablet TAKE 1 TABLET(10 MG) BY MOUTH DAILY  . Cholecalciferol 50 MCG (2000 UT) TABS Take 1  tablet (2,000 Units total) by mouth daily.  . clobetasol cream (TEMOVATE) 0.05 % APPLY EXTERNALLY TO THE AFFECTED AREA TWICE DAILY  . conjugated estrogens (PREMARIN) vaginal cream Place vaginally as directed. 2-3 times weekly.  . Dietary Management Product (FOSTEUM PLUS) CAPS TAKE ONE CAPSULE BY MOUTH TWICE DAILY  . furosemide (LASIX) 20 MG tablet Take 1 tablet (20 mg total) by mouth daily.  . Ibuprofen 200 MG CAPS Take 200 mg by mouth in the morning and at bedtime. 800mg  in the morning 600mg  in the evening  . Iron-Folic [id number] (FERRAPLUS 90) 90-1 MG TABS Take 1 tablet by mouth daily.     Allergies:   Alendronate sodium [alendronate sodium], Olmesartan, and Codeine   Social History   Socioeconomic History  . Marital status: Married    Spouse name: Not on file  . Number of children: Not on file  . Years of education: Not on file  . Highest education level: Not on file  Occupational History  . Not on file  Tobacco Use  . Smoking status: Never Smoker  . Smokeless tobacco: Never Used  Substance and Sexual Activity  . Alcohol use: Yes    Comment: Occasional  . Drug use: No  . Sexual activity: Yes    Birth control/protection: Surgical  Other Topics  Concern  . Not on file  Social History Narrative  . Not on file   Social Determinants of Health   Financial Resource Strain:   . Difficulty of Paying Living Expenses: Not on file  Food Insecurity:   . Worried About Charity fundraiser in the Last Year: Not on file  . Ran Out of Food in the Last Year: Not on file  Transportation Needs:   . Lack of Transportation (Medical): Not on file  . Lack of Transportation (Non-Medical): Not on file  Physical Activity:   . Days of Exercise per Week: Not on file  . Minutes of Exercise per Session: Not on file  Stress:   . Feeling of Stress : Not on file  Social Connections:   . Frequency of Communication with Friends and Family: Not on file  . Frequency of Social Gatherings with  Friends and Family: Not on file  . Attends Religious Services: Not on file  . Active Member of Clubs or Organizations: Not on file  . Attends Archivist Meetings: Not on file  . Marital Status: Not on file     Family History: The patient's family history includes Hypertension in her mother; Kidney disease in her father and maternal grandfather. There is no history of Cancer, Heart disease, Colon cancer, Esophageal cancer, Rectal cancer, or Stomach cancer. Mother had mitral valve prolapse and brother has atrial fibrillation  ROS:   Please see the history of present illness.    All other systems reviewed and are negative.  EKGs/Labs/Other Studies Reviewed:    The following studies were reviewed today:  EKG:  EKG is  ordered today.  The ekg ordered today demonstrates SR rate 70, RAE, RBBB  Recent Labs: 04/19/2020: ALT 17; Hemoglobin 11.4; Platelets 260; TSH 1.83 05/12/2020: Pro B Natriuretic peptide (BNP) 96.0 08/15/2020: BUN 20; Creatinine, Ser 0.70; Magnesium 2.0; Potassium 3.8; Sodium 139  Recent Lipid Panel    Component Value Date/Time   CHOL 159 04/19/2020 1103   TRIG 51 04/19/2020 1103   HDL 69 04/19/2020 1103   CHOLHDL 2.3 04/19/2020 1103   VLDL 9.8 03/09/2019 1336   LDLCALC 77 04/19/2020 1103   LDLDIRECT 122.7 09/27/2011 1123   06/08/20 Echo: IMPRESSIONS Mild to Moderate MR related to prolapse without signifcant LA dilation 1. Left ventricular ejection fraction, by estimation, is 60 to 65%. The  left ventricle has normal function. The left ventricle has no regional  wall motion abnormalities. Left ventricular diastolic parameters are  consistent with Grade I diastolic  dysfunction (impaired relaxation).  2. Right ventricular systolic function is normal. The right ventricular  size is normal. There is moderately elevated pulmonary artery systolic  pressure. The estimated right ventricular systolic pressure is 55.9 mmHg.  3. Right atrial size was mildly  dilated.  4. Bileaflet mitral valve prolapse, posterior leaflet greater than  anterior leaflet. The mitral valve is myxomatous. Mild to moderate mitral  valve regurgitation. No evidence of mitral stenosis.  5. The aortic valve is tricuspid. There is mild thickening of the aortic  valve. Aortic valve regurgitation is not visualized. No aortic stenosis is  present.  6. The inferior vena cava is dilated in size with <50% respiratory  variability, suggesting right atrial pressure of 15 mmHg.  Risk Assessment/Calculations:     The 10-year ASCVD risk score Mikey Bussing DC Jr., et al., 2013) is: 8%   Values used to calculate the score:     Age: 7 years     Sex:  Female     Is Non-Hispanic African American: No     Diabetic: No     Tobacco smoker: No     Systolic Blood Pressure: 798 mmHg     Is BP treated: Yes     HDL Cholesterol: 69 mg/dL     Total Cholesterol: 159 mg/dL   Physical Exam:    VS:  BP 100/70 (BP Location: Left Arm, Patient Position: Sitting, Cuff Size: Large)   Pulse 70   Ht 5\' 4"  (1.626 m)   Wt 198 lb (89.8 kg)   SpO2 94%   BMI 33.99 kg/m     Wt Readings from Last 3 Encounters:  08/17/20 198 lb (89.8 kg)  08/08/20 199 lb 9.6 oz (90.5 kg)  05/12/20 195 lb (88.5 kg)     GEN: Obese, well developed in no acute distress HEENT: Normal NECK: No JVD at 60 degrees; No carotid bruits LYMPHATICS: No lymphadenopathy CARDIAC: regular rate and rhythm, II/VI last systolic murmur without radiation into axilla or neck, rubs, gallops RESPIRATORY:  Clear to auscultation without rales, wheezing or rhonchi  ABDOMEN: Soft, non-tender, non-distended MUSCULOSKELETAL:  No edema; No deformity  SKIN: Warm and dry NEUROLOGIC:  Alert and oriented x 3 PSYCHIATRIC:  Normal affect   ASSESSMENT:    1. Pulmonary hypertension, unspecified ([place])   2. Essential hypertension   3. Nonrheumatic mitral valve regurgitation    PLAN:    In order of problems listed above:  Question of Pulmonary  Hypertension  Dyspnea on Exertion Mitral Regurgitation Obesity - WHO Functional Class I, Stage A-B, euvolemic, WHO II/III in the setting of MR and Obesiy - Diuretic regimen: continue 20 mg Po lasix - Fluid restriction of < 2 L  - SHARED DECISION MAKING discussed the risk and benefits or ischemic evaluation in the setting of female with risk factors including HLD and Eczema; will defer at this time but patient will be mindful of red flag signs and symptoms and get back to [place] if new sx - presently will defer RHC with improvement in sx.  Will send note to our [facility] colleagues and will see shortly after in case RHC for [place] eval is needed  Mid February follow up unless new symptoms or abnormal test results warranting change in plan  Would be reasonable for  APP Follow up   Shared Decision Making/Informed Consent      Patient amenable to plan  Medication Adjustments/Labs and Tests Ordered: Current medicines are reviewed at length with the patient today.  Concerns regarding medicines are outlined above.  Orders Placed This Encounter  Procedures  . EKG 12-Lead   No orders of the defined types were placed in this encounter.   Patient Instructions  Medication Instructions:  Your physician recommends that you continue on your current medications as directed. Please refer to the Current Medication list given to you today.  *If you need a refill on your cardiac medications before your next appointment, please call your pharmacy*   Lab Work: none If you have labs (blood work) drawn today and your tests are completely normal, you will receive your results only by: Marland Kitchen MyChart Message (if you have MyChart) OR . A paper copy in the mail If you have any lab test that is abnormal or we need to change your treatment, we will call you to review the results.   Testing/Procedures: none   Follow-Up: At [facility], you and your health needs are our priority.  As part of our continuing mission  to provide you with exceptional heart care, we have created designated Provider Care Teams.  These Care Teams include your primary Cardiologist (physician) and Advanced Practice Providers (APPs -  Physician Assistants and Nurse Practitioners) who all work together to provide you with the care you need, when you need it.  We recommend signing up for the patient portal called "MyChart".  Sign up information is provided on this After Visit Summary.  MyChart is used to connect with patients for Virtual Visits (Telemedicine).  Patients are able to view lab/test results, encounter notes, upcoming appointments, etc.  Non-urgent messages can be sent to your provider as well.   To learn more about what you can do with MyChart, go to [email or web address].    Your next appointment:   2 month(s) Mid February  The format for your next appointment:   In Person  Provider:   Rudean Haskell, MD   Other Instructions      Signed, Werner Lean, MD  08/17/2020 11:09 AM    [place]

## 2020-08-17 NOTE — Patient Instructions (Signed)
Medication Instructions:  Your physician recommends that you continue on your current medications as directed. Please refer to the Current Medication list given to you today.  *If you need a refill on your cardiac medications before your next appointment, please call your pharmacy*   Lab Work: none If you have labs (blood work) drawn today and your tests are completely normal, you will receive your results only by: Marland Kitchen MyChart Message (if you have MyChart) OR . A paper copy in the mail If you have any lab test that is abnormal or we need to change your treatment, we will call you to review the results.   Testing/Procedures: none   Follow-Up: At [facility], you and your health needs are our priority.  As part of our continuing mission to provide you with exceptional heart care, we have created designated Provider Care Teams.  These Care Teams include your primary Cardiologist (physician) and Advanced Practice Providers (APPs -  Physician Assistants and Nurse Practitioners) who all work together to provide you with the care you need, when you need it.  We recommend signing up for the patient portal called "MyChart".  Sign up information is provided on this After Visit Summary.  MyChart is used to connect with patients for Virtual Visits (Telemedicine).  Patients are able to view lab/test results, encounter notes, upcoming appointments, etc.  Non-urgent messages can be sent to your provider as well.   To learn more about what you can do with MyChart, go to [email or web address].    Your next appointment:   2 month(s) Mid February  The format for your next appointment:   In Person  Provider:   Rudean Haskell, MD   Other Instructions

## 2020-09-06 ENCOUNTER — Ambulatory Visit (INDEPENDENT_AMBULATORY_CARE_PROVIDER_SITE_OTHER): Payer: Medicare Other | Admitting: Pulmonary Disease

## 2020-09-06 ENCOUNTER — Other Ambulatory Visit: Payer: Self-pay

## 2020-09-06 DIAGNOSIS — R0600 Dyspnea, unspecified: Secondary | ICD-10-CM | POA: Diagnosis not present

## 2020-09-06 DIAGNOSIS — R0609 Other forms of dyspnea: Secondary | ICD-10-CM

## 2020-09-06 LAB — PULMONARY FUNCTION TEST
DL/VA % pred: 93 %
DL/VA: 4.47 ml/min/mmHg/L
DLCO cor % pred: 84 %
DLCO cor: 20.42 ml/min/mmHg
DLCO unc % pred: 84 %
DLCO unc: 20.42 ml/min/mmHg
FEF 25-75 Post: 2.53 L/sec
FEF 25-75 Pre: 2.46 L/sec
FEF2575-%Change-Post: 2 %
FEF2575-%Pred-Post: 137 %
FEF2575-%Pred-Pre: 133 %
FEV1-%Change-Post: 0 %
FEV1-%Pred-Post: 105 %
FEV1-%Pred-Pre: 105 %
FEV1-Post: 2.31 L
FEV1-Pre: 2.29 L
FEV1FVC-%Change-Post: 2 %
FEV1FVC-%Pred-Pre: 105 %
FEV6-%Change-Post: -2 %
FEV6-%Pred-Post: 95 %
FEV6-%Pred-Pre: 98 %
FEV6-Post: 2.7 L
FEV6-Pre: 2.79 L
FEV6FVC-%Pred-Post: 99 %
FEV6FVC-%Pred-Pre: 99 %
FVC-%Change-Post: -1 %
FVC-%Pred-Post: 97 %
FVC-%Pred-Pre: 98 %
FVC-Post: 2.74 L
FVC-Pre: 2.79 L
Post FEV1/FVC ratio: 84 %
Post FEV6/FVC ratio: 100 %
Pre FEV1/FVC ratio: 82 %
Pre FEV6/FVC Ratio: 100 %
RV % pred: 90 %
RV: 2.01 L
TLC % pred: 97 %
TLC: 4.92 L

## 2020-09-06 NOTE — Progress Notes (Signed)
Full PFT performed today. °

## 2020-10-05 ENCOUNTER — Other Ambulatory Visit: Payer: Self-pay

## 2020-10-05 ENCOUNTER — Encounter: Payer: Self-pay | Admitting: Pulmonary Disease

## 2020-10-05 ENCOUNTER — Ambulatory Visit (INDEPENDENT_AMBULATORY_CARE_PROVIDER_SITE_OTHER): Payer: Medicare Other | Admitting: Pulmonary Disease

## 2020-10-05 VITALS — BP 114/70 | HR 86 | Temp 97.2°F | Ht 64.0 in | Wt 198.2 lb

## 2020-10-05 DIAGNOSIS — I272 Pulmonary hypertension, unspecified: Secondary | ICD-10-CM

## 2020-10-05 NOTE — Progress Notes (Signed)
@Patient  ID: Madison Rowe, female    DOB: [birth year], 70 y.o.   MRN: [id number]  Chief Complaint  Patient presents with  . Follow-up    SOB with activity which has improved      Referring provider: Janith Lima, MD  HPI:    70 year old woman with shortness of breath previously seen by my pulmonary colleague who I am seeing in follow up for evaluation of pulmonary hypertension discovered on echocardiogram.  She returns feeling well. She denies any DOE. When asked if lasix helped she is unsure but notes no DOE which I reminded was present last visit so she agrees it has been helpful. She notes no significant weight loss with lasix. Reviewed labs after starting which showed stable kidney function, electrolytes. Reviewed PFT with patient - Spirometry normal, lung volumes WNL, DLCO WNL.   HPI at initial visit: Patient chief complaint dyspnea on exertion.  Present for the last year or so.  Gradually worsened maybe over the last few months.  She notes in July 2021 she was at her second home near [place].  She does lower extremity swelling and took one of her husband's hydrochlorothiazide thinking it was a fluid pill.  Shortly thereafter she felt dizzy, lightheaded.  She went to local ED and they did imaging including a CT scan with contrast to eval for PE which was negative for clot, lower extremity Dopplers were negative for DVT.  CT scan showed per report bilateral pleural effusions as well as bilateral groundglass opacities and thickened of the bronchovascular bundles read as pulmonary edema.  No further work-up was done for this.  Daily, there is initially discovered thyroid nodule that CT scan for which she underwent thyroid biopsy in the ED.  She has ongoing dyspnea on exertion was referred to pulmonary.  Recently seen by one of my colleagues.  TTE was ordered and performed 06/08/2020 which is personally reviewed and demonstrates mild elevation of PA pressures, preserved RV function,  mildly dilated RA, estimated RA pressure of 15 mmHg.  This also demonstrated mild to moderate mitral valve regurg.  She has not taken any fluid pills per her report.  Chest x-ray 05/13/2020 reviewed and interpreted as clear lungs,.  PMH: Hyperlipidemia, thyroid nodule Surgical history: Thyroid biopsy Family history: Denies significant respiratory issues in first-degree relatives Social history: Never smoker, lives in [place], a second home at [place] spends a good amount of time there each year   Questionaires / Pulmonary Flowsheets:   ACT:  No flowsheet data found.  MMRC: No flowsheet data found.  Epworth:  No flowsheet data found.  Tests:   FENO:  No results found for: NITRICOXIDE  PFT: PFT Results Latest Ref Rng & Units 09/06/2020  FVC-Pre L 2.79  FVC-Predicted Pre % 98  FVC-Post L 2.74  FVC-Predicted Post % 97  Pre FEV1/FVC % % 82  Post FEV1/FCV % % 84  FEV1-Pre L 2.29  FEV1-Predicted Pre % 105  FEV1-Post L 2.31  DLCO uncorrected ml/min/mmHg 20.42  DLCO UNC% % 84  DLCO corrected ml/min/mmHg 20.42  DLCO COR %Predicted % 84  DLVA Predicted % 93  TLC L 4.92  TLC % Predicted % 97  RV % Predicted % 90    WALK:  SIX MIN WALK 05/12/2020  Supplimental Oxygen during Test? (L/min) No  Tech Comments: fast pace/no SOB//lmr    Imaging: No results found.  Lab Results: Personally reviewed, normal renal function, highest absolute eosinophil count of 150 noted 04/19/2020, BNP normal  96 05/12/2020 CBC    Component Value Date/Time   WBC 6.0 04/19/2020 1103   RBC 4.37 04/19/2020 1103   HGB 11.4 (L) 04/19/2020 1103   HCT 35.9 04/19/2020 1103   PLT 260 04/19/2020 1103   MCV 82.2 04/19/2020 1103   MCH 26.1 (L) 04/19/2020 1103   MCHC 31.8 (L) 04/19/2020 1103   RDW 15.2 (H) 04/19/2020 1103   LYMPHSABS 1,704 04/19/2020 1103   MONOABS 0.6 03/07/2020 1105   EOSABS 150 04/19/2020 1103   BASOSABS 48 04/19/2020 1103    BMET    Component Value Date/Time   NA 139  08/15/2020 1521   NA 138 03/24/2018 0000   K 3.8 08/15/2020 1521   CL 100 08/15/2020 1521   CO2 31 08/15/2020 1521   GLUCOSE 78 08/15/2020 1521   BUN 20 08/15/2020 1521   BUN 15 03/24/2018 0000   CREATININE 0.70 08/15/2020 1521   CREATININE 0.67 04/19/2020 1103   CALCIUM 9.5 08/15/2020 1521   GFRNONAA 88 04/19/2020 1103   GFRAA 102 04/19/2020 1103    BNP No results found for: BNP  ProBNP    Component Value Date/Time   PROBNP 96.0 05/12/2020 1623    Specialty Problems      Pulmonary Problems   ALLERGIC RHINITIS   DOE (dyspnea on exertion)    CTa CTa  05/07/20 no pe/ bilateral GG changes with trace bilateral  effusions and thickening of brochovasular bundles in setting of peripheral edema/ resolved  -   05/12/2020   Walked RA  2 laps @ approx 260ft each @ fast pace  stopped due to end of study,  no sob   - sats at end 95%  -  Echo 06/08/20 : entricular ejection fraction, by estimation, is 60 to 65%. The left ventricle has normal function. The left ventricle has no regional wall motion abnormalities. Left ventricular diastolic parameters are consistent with Grade I diastolic dysfunction (impaired relaxation).  2. Right ventricular systolic function is normal. The right ventricular size is normal. There is moderately elevated pulmonary artery systolic pressure. The estimated right ventricular systolic pressure is 10.2 mmHg.  3. Right atrial size was mildly dilated.  4. 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 Mild to moderate mitral valve regurgitation. No evidence of mitral stenosis.  5. The aortic valve is tricuspid. There is mild thickening of the aortic valve. Aortic valve regurgitation is not visualized. No aortic stenosis is present.  6. The inferior vena cava is dilated in size with <50% respiratory variability, suggesting right atrial pressure of 15 mmHg > refer to Dr Silas Flood         Allergies   Allergen Reactions  . Alendronate Sodium [Alendronate Sodium]     nausea  . Olmesartan Nausea And Vomiting  . Codeine Nausea And Vomiting    Immunization History  Administered Date(s) Administered  . Fluad Quad(high Dose 65+) 06/16/2019  . Influenza, High Dose Seasonal PF 07/11/2016, 06/17/2017, 07/19/2020  . Influenza-Unspecified 06/21/2014, 07/30/2018  . PFIZER(Purple Top)SARS-COV-2 Vaccination 09/30/2019, 10/22/2019, 06/15/2020  . Pneumococcal Conjugate-13 07/11/2016  . Pneumococcal Polysaccharide-23 07/11/2017  . Tdap 09/20/2010  . Zoster 10/19/2009  . Zoster Recombinat (Shingrix) 01/21/2017, 07/11/2017    Past Medical History:  Diagnosis Date  . Allergy   . Anemia   . Arthritis   . Osteoporosis 2010  . Vitamin D deficient osteomalacia 2010    Tobacco History: Social History   Tobacco Use  Smoking Status Never Smoker  Smokeless Tobacco Never Used   Counseling given: Not Answered   Continue to not smoke  Outpatient Encounter Medications as of 10/05/2020  Medication Sig  . atorvastatin (LIPITOR) 10 MG tablet TAKE 1 TABLET(10 MG) BY MOUTH DAILY  . Cholecalciferol 50 MCG (2000 UT) TABS Take 1 tablet (2,000 Units total) by mouth daily.  . clobetasol cream (TEMOVATE) 0.05 % APPLY EXTERNALLY TO THE AFFECTED AREA TWICE DAILY  . conjugated estrogens (PREMARIN) vaginal cream Place vaginally as directed. 2-3 times weekly.  . Cyanocobalamin (VITAMIN B 12) 500 MCG TABS   . Dietary Management Product (FOSTEUM PLUS) CAPS TAKE ONE CAPSULE BY MOUTH TWICE DAILY  . furosemide (LASIX) 20 MG tablet Take 1 tablet (20 mg total) by mouth daily.  . Ibuprofen 200 MG CAPS Take 200 mg by mouth in the morning and at bedtime. 800mg  in the morning 600mg  in the evening  . Iron-Folic 99991111 (FERRAPLUS 90) 90-1 MG TABS Take 1 tablet by mouth daily.   No facility-administered encounter medications on file as of 10/05/2020.     Review of Systems  n/a  Physical Exam  BP  114/70 (BP Location: Right Arm, Cuff Size: Normal)   Pulse 86   Temp (!) 97.2 F (36.2 C)   Ht 5\' 4"  (1.626 m)   Wt 198 lb 3.2 oz (89.9 kg)   SpO2 98%   BMI 34.02 kg/m   Wt Readings from Last 5 Encounters:  10/05/20 198 lb 3.2 oz (89.9 kg)  08/17/20 198 lb (89.8 kg)  08/08/20 199 lb 9.6 oz (90.5 kg)  05/12/20 195 lb (88.5 kg)  04/19/20 195 lb (88.5 kg)    BMI Readings from Last 5 Encounters:  10/05/20 34.02 kg/m  08/17/20 33.99 kg/m  08/08/20 34.26 kg/m  05/12/20 33.47 kg/m  04/19/20 33.47 kg/m     Physical Exam General: Well-appearing, in no acute distress Eyes: EOMI, no icterus Neck: No JVP appreciated Respiratory: Clear to auscultation bilaterally, no wheezes Cardiovascular: Regular rate rhythm, 2 out of 6 late systolic murmur heard best at left upper sternal border, no edema Neuro: Normal gait, no weakness Psych: Normal mood, full affect   Assessment & Plan:   Presumed pulmonary hypertension: Based on mildly elevated PASP pressure in the setting of elevated right atrial pressure on recent TTE.  Highest concern for group 2 disease given mitral valve regurgitation seen on TTE.  No obvious risk factors for group 3 disease. PFTs normal. No signs or symptoms of connective tissue disease and no family history of the same.  No methamphetamine or other illicit substance use.  No history of DVT or PE to suggest chronic thromboembolic disease.  Given mild volume overload with elevated JVP on initial exam as well as elevated right atrial pressure on recent TTE,started lasix  20 mg oral daily 11/21. DOE better. Will repeat TTE soon (ordered today). If R sided pressures are improved, nothing further to do. If still elevated, will advocate for sleep study to eval for OSA as possible reversible contributor to pulmonary HTN.  Dyspnea on exertion: Likely multifactorial. PFTs normal.  Contributors include possible pulmonary hypertension, mitral valve regurgitation worsened with  exertion, possible other pulmonary etiologies such as asthma given atopic symptoms although no evidence of parenchymal disease on imaging. Improved with low dose lasix - likely related to cardiogenic volume overload.   Return if symptoms worsen or fail to improve.   Lanier Clam, MD 10/05/2020

## 2020-10-05 NOTE — Patient Instructions (Addendum)
Nice to see you  Continue Lasix  I ordered a repeat heart ultrasound - I am optimistic the pressures will be better with lasix!  Follow up determined by results of heart ultrasound

## 2020-11-01 ENCOUNTER — Ambulatory Visit (INDEPENDENT_AMBULATORY_CARE_PROVIDER_SITE_OTHER): Payer: Medicare Other | Admitting: Internal Medicine

## 2020-11-01 ENCOUNTER — Other Ambulatory Visit: Payer: Self-pay

## 2020-11-01 ENCOUNTER — Encounter: Payer: Self-pay | Admitting: Internal Medicine

## 2020-11-01 VITALS — BP 130/80 | HR 87 | Ht 64.0 in | Wt 203.8 lb

## 2020-11-01 DIAGNOSIS — I340 Nonrheumatic mitral (valve) insufficiency: Secondary | ICD-10-CM

## 2020-11-01 DIAGNOSIS — I272 Pulmonary hypertension, unspecified: Secondary | ICD-10-CM

## 2020-11-01 NOTE — Progress Notes (Signed)
Cardiology Office Note:    Date:  11/01/2020   ID:  Madison Rowe, DOB 08-May-1951, MRN [id number]  PCP:  Janith Lima, MD  [facility] Cardiologist:  Rudean Haskell MD [place] Electrophysiologist:  None   CC: Follow up [facility] (seen by Dr. Silas Flood)  History of Present Illness:    Madison Rowe is a 70 y.o. female with a hx of HLD, Obesity, Possible Eczema who presents for evaluation 08/17/20. In interim see by Dr. 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  Patient has 11/10/20  Echo Planned  Patient notes that she is doing well.  Since last visit notes no changes.  No new testing- spirometry has been un-remarkable.  There are no interval hospital/ED visit.    No chest pain or pressure.  No SOB/DOE and no PND/Orthopnea.  Stable weight 194-198 lbs but no leg swelling.  No palpitations or syncope.  Past Medical History:  Diagnosis Date  . Allergy   . Anemia   . Arthritis   . Osteoporosis 2010  . Vitamin D deficient osteomalacia 2010    Past Surgical History:  Procedure Laterality Date  . BREAST SURGERY    . ECTOPIC PREGNANCY SURGERY    . FOOT SURGERY Left   . TONSILLECTOMY      Current Medications: Current Meds  Medication Sig  . atorvastatin (LIPITOR) 10 MG tablet TAKE 1 TABLET(10 MG) BY MOUTH DAILY  . Cholecalciferol 50 MCG (2000 UT) TABS Take 1 tablet (2,000 Units total) by mouth daily.  . clobetasol cream (TEMOVATE) 0.05 % APPLY EXTERNALLY TO THE AFFECTED AREA TWICE DAILY  . conjugated estrogens (PREMARIN) vaginal cream Place vaginally as directed. 2-3 times weekly.  . Dietary Management Product (FOSTEUM PLUS) CAPS TAKE ONE CAPSULE BY MOUTH TWICE DAILY  . furosemide (LASIX) 20 MG tablet Take 1 tablet (20 mg total) by mouth daily.  . Ibuprofen 200 MG CAPS Take 200 mg by mouth in the morning and at bedtime. 800mg  in the morning 600mg  in the evening  . Iron-Folic [id number] (FERRAPLUS 90) 90-1 MG TABS Take 1 tablet by mouth  daily.     Allergies:   Alendronate sodium [alendronate sodium], Olmesartan, and Codeine   Social History   Socioeconomic History  . Marital status: Married    Spouse name: Not on file  . Number of children: Not on file  . Years of education: Not on file  . Highest education level: Not on file  Occupational History  . Not on file  Tobacco Use  . Smoking status: Never Smoker  . Smokeless tobacco: Never Used  Substance and Sexual Activity  . Alcohol use: Yes    Comment: Occasional  . Drug use: No  . Sexual activity: Yes    Birth control/protection: Surgical  Other Topics Concern  . Not on file  Social History Narrative  . Not on file   Social Determinants of Health   Financial Resource Strain: Not on file  Food Insecurity: Not on file  Transportation Needs: Not on file  Physical Activity: Not on file  Stress: Not on file  Social Connections: Not on file    Social:  Found out they are going to be grandparents  Family History: The patient's family history includes Hypertension in her mother; Kidney disease in her father and maternal grandfather. There is no history of Cancer, Heart disease, Colon cancer, Esophageal cancer, Rectal cancer, or Stomach cancer. Mother had mitral valve prolapse and brother has atrial fibrillation  ROS:   Please see the history of present illness.    All other systems reviewed and are negative.  EKGs/Labs/Other Studies Reviewed:    The following studies were reviewed today:  EKG:  08/17/20: SR rate 70, RAE, RBBB  Transthoracic Echocardiogram: Date: 06/08/2020 Results: Mild to Moderate MR related to prolapse without signifcant LA dilation IMPRESSIONS  1. Left ventricular ejection fraction, by estimation, is 60 to 65%. The  left ventricle has normal function. The left ventricle has no regional  wall motion abnormalities. Left ventricular diastolic parameters are  consistent with Grade I diastolic  dysfunction (impaired relaxation).   2. Right ventricular systolic function is normal. The right ventricular  size is normal. There is moderately elevated pulmonary artery systolic  pressure. The estimated right ventricular systolic pressure is 51.7 mmHg.  3. Right atrial size was mildly dilated.  4. Bileaflet mitral valve prolapse, posterior leaflet greater than  anterior leaflet. The mitral valve is myxomatous. Mild to moderate mitral  valve regurgitation. No evidence of mitral stenosis.  5. The aortic valve is tricuspid. There is mild thickening of the aortic  valve. Aortic valve regurgitation is not visualized. No aortic stenosis is  present.  6. The inferior vena cava is dilated in size with <50% respiratory  variability, suggesting right atrial pressure of 15 mmHg.  Recent Labs: 04/19/2020: ALT 17; Hemoglobin 11.4; Platelets 260; TSH 1.83 05/12/2020: Pro B Natriuretic peptide (BNP) 96.0 08/15/2020: BUN 20; Creatinine, Ser 0.70; Magnesium 2.0; Potassium 3.8; Sodium 139  Recent Lipid Panel    Component Value Date/Time   CHOL 159 04/19/2020 1103   TRIG 51 04/19/2020 1103   HDL 69 04/19/2020 1103   CHOLHDL 2.3 04/19/2020 1103   VLDL 9.8 03/09/2019 1336   LDLCALC 77 04/19/2020 1103   LDLDIRECT 122.7 09/27/2011 1123    Risk Assessment/Calculations:     The 10-year ASCVD risk score Mikey Bussing DC Brooke Bonito., et al., 2013) is: 13.2%   Values used to calculate the score:     Age: 49 years     Sex: Female     Is Non-Hispanic African American: No     Diabetic: No     Tobacco smoker: No     Systolic Blood Pressure: 616 mmHg     Is BP treated: Yes     HDL Cholesterol: 69 mg/dL     Total Cholesterol: 159 mg/dL   Physical Exam:    VS:  BP 130/80   Pulse 87   Ht 5\' 4"  (1.626 m)   Wt 203 lb 12.8 oz (92.4 kg)   SpO2 97%   BMI 34.98 kg/m     Wt Readings from Last 3 Encounters:  11/01/20 203 lb 12.8 oz (92.4 kg)  10/05/20 198 lb 3.2 oz (89.9 kg)  08/17/20 198 lb (89.8 kg)     GEN: Obese, well developed in no acute  distress HEENT: Normal NECK: No JVD No carotid bruits LYMPHATICS: No lymphadenopathy CARDIAC: regular rate and rhythm, I/VI last systolic murmur without radiation into axilla or neck, no rubs, gallops RESPIRATORY:  Clear to auscultation without rales, wheezing or rhonchi  ABDOMEN: Soft, non-tender, non-distended MUSCULOSKELETAL:  No edema; No deformity  SKIN: Warm and dry NEUROLOGIC:  Alert and oriented x 3 PSYCHIATRIC:  Normal affect   ASSESSMENT:    1. Pulmonary hypertension, unspecified ([place])   2. Nonrheumatic mitral valve regurgitation    PLAN:    In order of problems listed above:  Pulmonary Hypertension Mitral Regurgitation Obesity - WHO Functional Class I,  Stage A-B, euvolemic, WHO II/III in the setting of MR and Obesiy - Diuretic regimen: continue 20 mg PO lasix - Fluid restriction of < 2 L; Discussed daily weight with patient (she will give [place] her daily weight trends when we call back with echo results; will decide diuretic dosing changes based on this) - Will follow up echocardiogram 11/10/20; low threshold for TEE based on results; to this effect we have discuss the procedure:  - if worsening MR or underestimation of jets; we have discussed  the risks and benefits of transesophageal echocardiogram have been explained including risks of esophageal damage, perforation (1:10,000 risk), bleeding, pharyngeal hematoma as well as other potential complications associated with conscious sedation including aspiration, arrhythmia, respiratory failure and death. Alternatives to treatment were discussed, questions were answered. Patient is willing to proceed of necessary - will do TEE if MR is concerning  Six months follow up unless new symptoms or abnormal test results warranting change in plan  Would be reasonable for  Video Visit Follow up Would be reasonable for  APP Follow up  Medication Adjustments/Labs and Tests Ordered: Current medicines are reviewed at length with the  patient today.  Concerns regarding medicines are outlined above.  No orders of the defined types were placed in this encounter.  No orders of the defined types were placed in this encounter.   Patient Instructions  Medication Instructions:  Your physician recommends that you continue on your current medications as directed. Please refer to the Current Medication list given to you today.  *If you need a refill on your cardiac medications before your next appointment, please call your pharmacy*   Lab Work: NONE If you have labs (blood work) drawn today and your tests are completely normal, you will receive your results only by: Marland Kitchen MyChart Message (if you have MyChart) OR . A paper copy in the mail If you have any lab test that is abnormal or we need to change your treatment, we will call you to review the results.   Testing/Procedures: NONE   Follow-Up: At [facility], you and your health needs are our priority.  As part of our continuing mission to provide you with exceptional heart care, we have created designated Provider Care Teams.  These Care Teams include your primary Cardiologist (physician) and Advanced Practice Providers (APPs -  Physician Assistants and Nurse Practitioners) who all work together to provide you with the care you need, when you need it.    Your next appointment:   6 month(s)  The format for your next appointment:   In Person  Provider:   You may see Gasper Sells, MD or one of the following Advanced Practice Providers on your designated Care Team:    Melina Copa, PA-C  Ermalinda Barrios, PA-C    Other Instructions   Your physician recommends that you weigh, daily, at the same time every day, and in the same amount of clothing. Please record your daily weights         Signed, Werner Lean, MD  11/01/2020 4:10 PM    [place]

## 2020-11-01 NOTE — Patient Instructions (Addendum)
Medication Instructions:  Your physician recommends that you continue on your current medications as directed. Please refer to the Current Medication list given to you today.  *If you need a refill on your cardiac medications before your next appointment, please call your pharmacy*   Lab Work: NONE If you have labs (blood work) drawn today and your tests are completely normal, you will receive your results only by: Marland Kitchen MyChart Message (if you have MyChart) OR . A paper copy in the mail If you have any lab test that is abnormal or we need to change your treatment, we will call you to review the results.   Testing/Procedures: NONE   Follow-Up: At [facility], you and your health needs are our priority.  As part of our continuing mission to provide you with exceptional heart care, we have created designated Provider Care Teams.  These Care Teams include your primary Cardiologist (physician) and Advanced Practice Providers (APPs -  Physician Assistants and Nurse Practitioners) who all work together to provide you with the care you need, when you need it.    Your next appointment:   6 month(s)  The format for your next appointment:   In Person  Provider:   You may see Gasper Sells, MD or one of the following Advanced Practice Providers on your designated Care Team:    Melina Copa, PA-C  Ermalinda Barrios, PA-C    Other Instructions   Your physician recommends that you weigh, daily, at the same time every day, and in the same amount of clothing. Please record your daily weights

## 2020-11-02 ENCOUNTER — Other Ambulatory Visit: Payer: Self-pay | Admitting: Internal Medicine

## 2020-11-02 DIAGNOSIS — E611 Iron deficiency: Secondary | ICD-10-CM

## 2020-11-02 MED ORDER — ACCRUFER 30 MG PO CAPS: 1.0000 | ORAL_CAPSULE | Freq: Two times a day (BID) | ORAL | 0 refills | Status: DC

## 2020-11-03 DIAGNOSIS — K1329 Other disturbances of oral epithelium, including tongue: Secondary | ICD-10-CM | POA: Diagnosis not present

## 2020-11-10 ENCOUNTER — Ambulatory Visit (HOSPITAL_COMMUNITY): Payer: Medicare Other | Attending: Cardiology

## 2020-11-10 ENCOUNTER — Other Ambulatory Visit: Payer: Self-pay

## 2020-11-10 DIAGNOSIS — I272 Pulmonary hypertension, unspecified: Secondary | ICD-10-CM | POA: Insufficient documentation

## 2020-11-10 LAB — ECHOCARDIOGRAM COMPLETE
Area-P 1/2: 2.37 cm2
S' Lateral: 2.9 cm

## 2020-11-18 DIAGNOSIS — K1329 Other disturbances of oral epithelium, including tongue: Secondary | ICD-10-CM | POA: Diagnosis not present

## 2020-11-21 DIAGNOSIS — Z01411 Encounter for gynecological examination (general) (routine) with abnormal findings: Secondary | ICD-10-CM | POA: Diagnosis not present

## 2020-11-21 DIAGNOSIS — L900 Lichen sclerosus et atrophicus: Secondary | ICD-10-CM | POA: Diagnosis not present

## 2020-11-22 ENCOUNTER — Telehealth: Payer: Self-pay | Admitting: Pulmonary Disease

## 2020-11-22 NOTE — Telephone Encounter (Signed)
Reviewed results of ECHO with patient reassured her that results were about the same as last ECHO in Sept. 2021, nothing of concern noted on recent ECHO.  She had no further questions or concerns.

## 2020-11-22 NOTE — Telephone Encounter (Signed)
Called and spoke to patient, who is requesting echo results from 11/10/2020.   Dr. Silas Flood, please advise. Thanks

## 2020-11-22 NOTE — Telephone Encounter (Signed)
Dr Silas Flood, We received this mychart message from patient. Please advise.  I  have received the results of the echocardiogram I had on 11-10-20. However, I need someone to explain the results to me. Is it better or worse than the one I had in September 2021? What does it mean? What is the plan going forward?

## 2020-11-22 NOTE — Telephone Encounter (Signed)
The echo results are improved from September. It seems the lasix has led to improvement. Since the pressures on the right side of the heart are improved, I do not plan to do further testing.

## 2020-11-23 NOTE — Telephone Encounter (Signed)
Yes, follow up with pulmonary as needed.

## 2020-11-23 NOTE — Telephone Encounter (Signed)
Called and spoke with pt to make her aware that she can follow up with our office prn and that any meds that we have been filling for her can be deferred to either PCP or Cardiology. Pt verbalized understanding. Nothing further needed.

## 2020-11-23 NOTE — Telephone Encounter (Signed)
Patient is returning phone call. Patient phone number is [phone].

## 2020-11-23 NOTE — Telephone Encounter (Signed)
Called and spoke with pt letting her know the results of the echo and she verbalized understanding.  Dr. Silas Flood, just to clarify that pt is to follow up as needed is that correct?

## 2020-11-23 NOTE — Telephone Encounter (Signed)
Lm for patient.  

## 2020-12-09 ENCOUNTER — Other Ambulatory Visit: Payer: Self-pay | Admitting: Internal Medicine

## 2020-12-09 DIAGNOSIS — Z91038 Other insect allergy status: Secondary | ICD-10-CM

## 2020-12-20 ENCOUNTER — Other Ambulatory Visit: Payer: Self-pay | Admitting: Internal Medicine

## 2020-12-20 DIAGNOSIS — E785 Hyperlipidemia, unspecified: Secondary | ICD-10-CM

## 2021-01-18 ENCOUNTER — Ambulatory Visit (INDEPENDENT_AMBULATORY_CARE_PROVIDER_SITE_OTHER): Payer: Medicare Other | Admitting: Podiatry

## 2021-01-18 ENCOUNTER — Encounter: Payer: Self-pay | Admitting: Podiatry

## 2021-01-18 ENCOUNTER — Ambulatory Visit (INDEPENDENT_AMBULATORY_CARE_PROVIDER_SITE_OTHER): Payer: Medicare Other

## 2021-01-18 ENCOUNTER — Other Ambulatory Visit: Payer: Self-pay

## 2021-01-18 DIAGNOSIS — M79672 Pain in left foot: Secondary | ICD-10-CM

## 2021-01-18 DIAGNOSIS — M778 Other enthesopathies, not elsewhere classified: Secondary | ICD-10-CM

## 2021-01-18 MED ORDER — TRIAMCINOLONE ACETONIDE 10 MG/ML IJ SUSP
10.0000 mg | Freq: Once | INTRAMUSCULAR | Status: AC
  Administered 2021-01-18: 10 mg

## 2021-01-18 NOTE — Progress Notes (Signed)
Subjective:   Patient ID: Madison Rowe, female   DOB: 70 y.o.   MRN: [id number]   HPI patient states she has a lot of pain in the dorsum of her left midfoot and states it is gotten worse and also to the medial side of the foot.  Feels like her bunion is also bothering her more and she wants it checked   ROS      Objective:  Physical Exam  Neurovascular status intact with exquisite discomfort in the dorsal extensor complex left with inflammation mild discomfort medial and mild structural bunion deformity     Assessment:  Extensor tendinitis left with inflammation along with structural deformity and inflammation medial side of foot     Plan:  H&P reviewed condition and I have recommended conservative care.  I did sterile prep injected the extensor complex 3 mg Dexasone Kenalog 5 mg liken advised on heat ice therapy reappoint to recheck  X-rays indicate there is some dorsal spurring moderate bunion no other pathology noted

## 2021-01-20 ENCOUNTER — Other Ambulatory Visit: Payer: Self-pay | Admitting: Internal Medicine

## 2021-01-23 DIAGNOSIS — D692 Other nonthrombocytopenic purpura: Secondary | ICD-10-CM | POA: Diagnosis not present

## 2021-01-23 DIAGNOSIS — L820 Inflamed seborrheic keratosis: Secondary | ICD-10-CM | POA: Diagnosis not present

## 2021-01-23 DIAGNOSIS — L304 Erythema intertrigo: Secondary | ICD-10-CM | POA: Diagnosis not present

## 2021-01-27 ENCOUNTER — Other Ambulatory Visit: Payer: Self-pay | Admitting: Internal Medicine

## 2021-01-30 ENCOUNTER — Other Ambulatory Visit: Payer: Self-pay | Admitting: Internal Medicine

## 2021-01-30 ENCOUNTER — Other Ambulatory Visit: Payer: Self-pay

## 2021-01-30 ENCOUNTER — Telehealth: Payer: Self-pay | Admitting: Internal Medicine

## 2021-01-30 DIAGNOSIS — E611 Iron deficiency: Secondary | ICD-10-CM

## 2021-01-30 MED ORDER — ACCRUFER 30 MG PO CAPS: 1.0000 | ORAL_CAPSULE | Freq: Two times a day (BID) | ORAL | 1 refills | Status: DC

## 2021-01-30 MED ORDER — FERRAPLUS 90 90-1 MG PO TABS: 1.0000 | ORAL_TABLET | Freq: Every day | ORAL | 1 refills | Status: DC

## 2021-01-30 NOTE — Telephone Encounter (Signed)
1.Medication Requested: Ferric Maltol (ACCRUFER) 30 MG CAPS    2. Pharmacy (Name, Street, [place]): [address]  3. On Med List: no   4. Last Visit with PCP: 04-19-20  5. Next visit date with PCP: n/a   Patient said that she is leaving for 2 weeks next week and will not having enough medication. Please advise. She also states that she does not take Iron-Folic [id number] (FERRAPLUS 90) 90-1 MG TABS    Agent: Please be advised that RX refills may take up to 3 business days. We ask that you follow-up with your pharmacy.

## 2021-01-31 DIAGNOSIS — Z20822 Contact with and (suspected) exposure to covid-19: Secondary | ICD-10-CM | POA: Diagnosis not present

## 2021-01-31 DIAGNOSIS — U071 COVID-19: Secondary | ICD-10-CM | POA: Diagnosis not present

## 2021-02-09 ENCOUNTER — Telehealth: Payer: Self-pay | Admitting: Internal Medicine

## 2021-02-09 NOTE — Telephone Encounter (Signed)
   Seeking advice  Patient seeking advice for drainage. She recently tested positive for covid19. She has no other symptoms other than sinus drainage  Patient has questions about taking Mucinex.  Seeking advice for OTC medications

## 2021-02-10 NOTE — Telephone Encounter (Signed)
Monitor sx's

## 2021-02-13 NOTE — Telephone Encounter (Signed)
Called pt, LVM.   

## 2021-03-07 ENCOUNTER — Telehealth: Payer: Self-pay | Admitting: Internal Medicine

## 2021-03-07 NOTE — Telephone Encounter (Signed)
1.Medication Requested: Dietary Management Product (FOSTEUM PLUS) CAPS  2. Pharmacy (Name, [place]): [facility] [place], [place] Phone:  [phone]  Fax:  [phone]     3. On Med List: Y  4. Last Visit with PCP: 04/19/2020   5. Next visit date with PCP: N/A   Agent: Please be advised that RX refills may take up to 3 business days. We ask that you follow-up with your pharmacy.

## 2021-03-08 ENCOUNTER — Other Ambulatory Visit: Payer: Self-pay | Admitting: Internal Medicine

## 2021-03-08 DIAGNOSIS — M810 Age-related osteoporosis without current pathological fracture: Secondary | ICD-10-CM

## 2021-03-08 MED ORDER — FOSTEUM PLUS PO CAPS: 1.0000 | ORAL_CAPSULE | Freq: Two times a day (BID) | ORAL | 1 refills | Status: DC

## 2021-04-03 ENCOUNTER — Telehealth: Payer: Self-pay | Admitting: Internal Medicine

## 2021-04-03 NOTE — Telephone Encounter (Signed)
LVM for pt to rtn my call to schedule AWV with [facility]. Please schedule this appt if pt calls the office.  °

## 2021-04-06 DIAGNOSIS — H524 Presbyopia: Secondary | ICD-10-CM | POA: Diagnosis not present

## 2021-04-06 DIAGNOSIS — H5203 Hypermetropia, bilateral: Secondary | ICD-10-CM | POA: Diagnosis not present

## 2021-04-06 DIAGNOSIS — H2513 Age-related nuclear cataract, bilateral: Secondary | ICD-10-CM | POA: Diagnosis not present

## 2021-04-06 DIAGNOSIS — H52223 Regular astigmatism, bilateral: Secondary | ICD-10-CM | POA: Diagnosis not present

## 2021-05-04 DIAGNOSIS — Z1231 Encounter for screening mammogram for malignant neoplasm of breast: Secondary | ICD-10-CM | POA: Diagnosis not present

## 2021-05-04 LAB — HM MAMMOGRAPHY

## 2021-05-05 ENCOUNTER — Ambulatory Visit (INDEPENDENT_AMBULATORY_CARE_PROVIDER_SITE_OTHER): Payer: Medicare Other

## 2021-05-05 ENCOUNTER — Other Ambulatory Visit: Payer: Self-pay

## 2021-05-05 VITALS — BP 118/80 | HR 83 | Temp 98.1°F | Ht 64.0 in | Wt 186.4 lb

## 2021-05-05 DIAGNOSIS — Z0000 Encounter for general adult medical examination without abnormal findings: Secondary | ICD-10-CM

## 2021-05-05 NOTE — Patient Instructions (Signed)
Madison Rowe , Thank you for taking time to come for your Medicare Wellness Visit. I appreciate your ongoing commitment to your health goals. Please review the following plan we discussed and let me know if I can assist you in the future.   Screening recommendations/referrals: Colonoscopy: 02/12/2017; due every 5 years Mammogram: 05/04/2021; due every 1-2 years Bone Density: 04/08/2019; due every 2-3 years Recommended yearly ophthalmology/optometry visit for glaucoma screening and checkup Recommended yearly dental visit for hygiene and checkup  Vaccinations: Influenza vaccine: 07/19/2020 Pneumococcal vaccine: 07/11/2016, 07/11/2017 Tdap vaccine: 03/07/2021; due every 10 years Shingles vaccine: 01/21/2017, 07/11/2017   Covid-19: 1/20/201, 10/22/2019, 06/15/2020, 01/23/2021  Advanced directives: Please bring a copy of your health care power of attorney and living will to the office at your convenience.  Conditions/risks identified: Yes, Client understands the importance of follow-up with providers by attending scheduled visits and discussed goals to eat healthier, increase physical activity, exercise the brain, socialize more, get enough sleep and make time for laughter.  Next appointment: Please schedule your next Medicare Wellness Visit with your Nurse Health Advisor in 1 year by calling [phone].   Preventive Care 72 Years and Older, Female Preventive care refers to lifestyle choices and visits with your health care provider that can promote health and wellness. What does preventive care include? A yearly physical exam. This is also called an annual well check. Dental exams once or twice a year. Routine eye exams. Ask your health care provider how often you should have your eyes checked. Personal lifestyle choices, including: Daily care of your teeth and gums. Regular physical activity. Eating a healthy diet. Avoiding tobacco and drug use. Limiting alcohol use. Practicing safe sex. Taking  low-dose aspirin every day. Taking vitamin and mineral supplements as recommended by your health care provider. What happens during an annual well check? The services and screenings done by your health care provider during your annual well check will depend on your age, overall health, lifestyle risk factors, and family history of disease. Counseling  Your health care provider may ask you questions about your: Alcohol use. Tobacco use. Drug use. Emotional well-being. Home and relationship well-being. Sexual activity. Eating habits. History of falls. Memory and ability to understand (cognition). Work and work Statistician. Reproductive health. Screening  You may have the following tests or measurements: Height, weight, and BMI. Blood pressure. Lipid and cholesterol levels. These may be checked every 5 years, or more frequently if you are over 4 years old. Skin check. Lung cancer screening. You may have this screening every year starting at age 46 if you have a 30-pack-year history of smoking and currently smoke or have quit within the past 15 years. Fecal occult blood test (FOBT) of the stool. You may have this test every year starting at age 20. Flexible sigmoidoscopy or colonoscopy. You may have a sigmoidoscopy every 5 years or a colonoscopy every 10 years starting at age 34. Hepatitis C blood test. Hepatitis B blood test. Sexually transmitted disease (STD) testing. Diabetes screening. This is done by checking your blood sugar (glucose) after you have not eaten for a while (fasting). You may have this done every 1-3 years. Bone density scan. This is done to screen for osteoporosis. You may have this done starting at age 90. Mammogram. This may be done every 1-2 years. Talk to your health care provider about how often you should have regular mammograms. Talk with your health care provider about your test results, treatment options, and if necessary, the need for  more tests. Vaccines   Your health care provider may recommend certain vaccines, such as: Influenza vaccine. This is recommended every year. Tetanus, diphtheria, and acellular pertussis (Tdap, Td) vaccine. You may need a Td booster every 10 years. Zoster vaccine. You may need this after age 71. Pneumococcal 13-valent conjugate (PCV13) vaccine. One dose is recommended after age 2. Pneumococcal polysaccharide (PPSV23) vaccine. One dose is recommended after age 65. Talk to your health care provider about which screenings and vaccines you need and how often you need them. This information is not intended to replace advice given to you by your health care provider. Make sure you discuss any questions you have with your health care provider. Document Released: 09/23/2015 Document Revised: 05/16/2016 Document Reviewed: 06/28/2015 Elsevier Interactive Patient Education  2017 [place] Prevention in the Home Falls can cause injuries. They can happen to people of all ages. There are many things you can do to make your home safe and to help prevent falls. What can I do on the outside of my home? Regularly fix the edges of walkways and driveways and fix any cracks. Remove anything that might make you trip as you walk through a door, such as a raised step or threshold. Trim any bushes or trees on the path to your home. Use bright outdoor lighting. Clear any walking paths of anything that might make someone trip, such as rocks or tools. Regularly check to see if handrails are loose or broken. Make sure that both sides of any steps have handrails. Any raised decks and porches should have guardrails on the edges. Have any leaves, snow, or ice cleared regularly. Use sand or salt on walking paths during winter. Clean up any spills in your garage right away. This includes oil or grease spills. What can I do in the bathroom? Use night lights. Install grab bars by the toilet and in the tub and shower. Do not use towel  bars as grab bars. Use non-skid mats or decals in the tub or shower. If you need to sit down in the shower, use a plastic, non-slip stool. Keep the floor dry. Clean up any water that spills on the floor as soon as it happens. Remove soap buildup in the tub or shower regularly. Attach bath mats securely with double-sided non-slip rug tape. Do not have throw rugs and other things on the floor that can make you trip. What can I do in the bedroom? Use night lights. Make sure that you have a light by your bed that is easy to reach. Do not use any sheets or blankets that are too big for your bed. They should not hang down onto the floor. Have a firm chair that has side arms. You can use this for support while you get dressed. Do not have throw rugs and other things on the floor that can make you trip. What can I do in the kitchen? Clean up any spills right away. Avoid walking on wet floors. Keep items that you use a lot in easy-to-reach places. If you need to reach something above you, use a strong step stool that has a grab bar. Keep electrical cords out of the way. Do not use floor polish or wax that makes floors slippery. If you must use wax, use non-skid floor wax. Do not have throw rugs and other things on the floor that can make you trip. What can I do with my stairs? Do not leave any items on the stairs. Make  sure that there are handrails on both sides of the stairs and use them. Fix handrails that are broken or loose. Make sure that handrails are as long as the stairways. Check any carpeting to make sure that it is firmly attached to the stairs. Fix any carpet that is loose or worn. Avoid having throw rugs at the top or bottom of the stairs. If you do have throw rugs, attach them to the floor with carpet tape. Make sure that you have a light switch at the top of the stairs and the bottom of the stairs. If you do not have them, ask someone to add them for you. What else can I do to help  prevent falls? Wear shoes that: Do not have high heels. Have rubber bottoms. Are comfortable and fit you well. Are closed at the toe. Do not wear sandals. If you use a stepladder: Make sure that it is fully opened. Do not climb a closed stepladder. Make sure that both sides of the stepladder are locked into place. Ask someone to hold it for you, if possible. Clearly mark and make sure that you can see: Any grab bars or handrails. First and last steps. Where the edge of each step is. Use tools that help you move around (mobility aids) if they are needed. These include: Canes. Walkers. Scooters. Crutches. Turn on the lights when you go into a dark area. Replace any light bulbs as soon as they burn out. Set up your furniture so you have a clear path. Avoid moving your furniture around. If any of your floors are uneven, fix them. If there are any pets around you, be aware of where they are. Review your medicines with your doctor. Some medicines can make you feel dizzy. This can increase your chance of falling. Ask your doctor what other things that you can do to help prevent falls. This information is not intended to replace advice given to you by your health care provider. Make sure you discuss any questions you have with your health care provider. Document Released: 06/23/2009 Document Revised: 02/02/2016 Document Reviewed: 10/01/2014 Elsevier Interactive Patient Education  2017 [employer].

## 2021-05-05 NOTE — Progress Notes (Signed)
Subjective:   Madison Rowe is a 70 y.o. female who presents for Medicare Annual (Subsequent) preventive examination.  Review of Systems     Cardiac Risk Factors include: advanced age (>4mn, >>61women);dyslipidemia;family history of premature cardiovascular disease;obesity (BMI >30kg/m2);Other (see comment) (Pulmonary HTN)     Objective:    Today's Vitals   05/05/21 0918  BP: 118/80  Pulse: 83  Temp: 98.1 F (36.7 C)  SpO2: 94%  Weight: 186 lb 6.4 oz (84.6 kg)  Height: '5\' 4"'$  (1.626 m)  PainSc: 0-No pain   Body mass index is 32 kg/m.  Advanced Directives 05/05/2021 01/30/2017  Does Patient Have a Medical Advance Directive? Yes Yes  Type of Advance Directive Living will;[place]care [place]  Does patient want to make changes to medical advance directive? No - Guardian declined -  [place]? No - copy requested -    Current Medications (verified) Outpatient Encounter Medications as of 05/05/2021  Medication Sig   atorvastatin (LIPITOR) 10 MG tablet TAKE 1 TABLET(10 MG) BY MOUTH DAILY.   Cholecalciferol 50 MCG (2000 UT) TABS Take 1 tablet (2,000 Units total) by mouth daily.   clobetasol cream (TEMOVATE) 0.05 % APPLY EXTERNALLY TO THE AFFECTED AREA TWICE DAILY   conjugated estrogens (PREMARIN) vaginal cream Place vaginally as directed. 2-3 times weekly.   Dietary Management Product (FOSTEUM PLUS) CAPS Take 1 capsule by mouth 2 (two) times daily.   Ferric Maltol (ACCRUFER) 30 MG CAPS Take 1 capsule by mouth in the morning and at bedtime. (Patient taking differently: Take 1 capsule by mouth in the morning and at bedtime. Take on empty stomach)   fluticasone (FLONASE) 50 MCG/ACT nasal spray Place 2 sprays into both nostrils daily.   furosemide (LASIX) 20 MG tablet Take 1 tablet (20 mg total) by mouth daily.   Ibuprofen 200 MG CAPS Take 200 mg by mouth in the morning and at bedtime. '800mg'$  in the  morning '600mg'$  in the evening   loratadine (CLARITIN) 10 MG tablet Take 10 mg by mouth daily.   Vitamin E 200 units TABS    No facility-administered encounter medications on file as of 05/05/2021.    Allergies (verified) Alendronate sodium [alendronate sodium], Olmesartan, and Codeine   History: Past Medical History:  Diagnosis Date   Allergy    Anemia    Arthritis    Osteoporosis 2010   Vitamin D deficient osteomalacia 2010   Past Surgical History:  Procedure Laterality Date   BREAST SURGERY     ECTOPIC PREGNANCY SURGERY     FOOT SURGERY Left    TONSILLECTOMY     Family History  Problem Relation Age of Onset   Kidney disease Father    Kidney disease Maternal Grandfather    Hypertension Mother    Cancer Neg Hx    Heart disease Neg Hx    Colon cancer Neg Hx    Esophageal cancer Neg Hx    Rectal cancer Neg Hx    Stomach cancer Neg Hx    Social History   Socioeconomic History   Marital status: Married    Spouse name: Not on file   Number of children: Not on file   Years of education: Not on file   Highest education level: Not on file  Occupational History   Not on file  Tobacco Use   Smoking status: Never   Smokeless tobacco: Never  Substance and Sexual Activity  Alcohol use: Yes    Comment: Occasional   Drug use: No   Sexual activity: Yes    Birth control/protection: Surgical  Other Topics Concern   Not on file  Social History Narrative   Not on file   Social Determinants of Health   Financial Resource Strain: Low Risk    Difficulty of Paying Living Expenses: Not hard at all  Food Insecurity: No Food Insecurity   Worried About Charity fundraiser in the Last Year: Never true   Ran Out of Food in the Last Year: Never true  Transportation Needs: No Transportation Needs   Lack of Transportation (Medical): No   Lack of Transportation (Non-Medical): No  Physical Activity: Sufficiently Active   Days of Exercise per Week: 5 days   Minutes of Exercise  per Session: 30 min  Stress: No Stress Concern Present   Feeling of Stress : Not at all  Social Connections: Socially Integrated   Frequency of Communication with Friends and Family: More than three times a week   Frequency of Social Gatherings with Friends and Family: More than three times a week   Attends Religious Services: More than 4 times per year   Active Member of [employer] or Organizations: Yes   Attends Music therapist: More than 4 times per year   Marital Status: Married    Tobacco Counseling Counseling given: Not Answered   Clinical Intake:  Pre-visit preparation completed: Yes  Pain : No/denies pain Pain Score: 0-No pain     BMI - recorded: 32 Nutritional Status: BMI > 30  Obese Nutritional Risks: None Diabetes: No  How often do you need to have someone help you when you read instructions, pamphlets, or other written materials from your doctor or pharmacy?: 1 - Never What is the last grade level you completed in school?: Graduate Degree  Diabetic? no  Interpreter Needed?: No  Information entered by :: Lisette Abu, LPN   Activities of Daily Living In your present state of health, do you have any difficulty performing the following activities: 05/05/2021  Hearing? N  Vision? N  Difficulty concentrating or making decisions? N  Walking or climbing stairs? N  Dressing or bathing? N  Doing errands, shopping? N  Preparing Food and eating ? N  Using the Toilet? N  In the past six months, have you accidently leaked urine? N  Do you have problems with loss of bowel control? N  Managing your Medications? N  Managing your Finances? N  Housekeeping or managing your Housekeeping? N  Some recent data might be hidden    Patient Care Team: Janith Lima, MD as PCP - General (Internal Medicine) Marica Otter, [place] as Consulting Physician (Optometry)  Indicate any recent Medical Services you may have received from other than [facility] providers in the  past year (date may be approximate).     Assessment:   This is a routine wellness examination for Madison Rowe.  Hearing/Vision screen Hearing Screening - Comments:: Patient denied any hearing difficulty.  No hearing aids. Vision Screening - Comments:: Patient wears contacts.  Eye exam done annually by Dr. Marica Otter.  Dietary issues and exercise activities discussed: Current Exercise Habits: Home exercise routine, Type of exercise: walking, Time (Minutes): 30, Frequency (Times/Week): 5, Weekly Exercise (Minutes/Week): 150, Intensity: Moderate, Exercise limited by: None identified   Goals Addressed               This Visit's Progress     Patient Stated (pt-stated)  My goal is to lose more weight.  I have lost 15 pounds and I would like to reach a weight goal of 170 pounds.      Depression Screen PHQ 2/9 Scores 05/05/2021 04/19/2020 03/09/2019 01/02/2018 01/01/2018 05/26/2015  PHQ - 2 Score 0 0 0 0 0 0    Fall Risk Fall Risk  05/05/2021 04/19/2020 03/09/2019 01/02/2018 01/01/2018  Falls in the past year? 0 1 0 No No  Number falls in past yr: 0 0 0 - -  Injury with Fall? 0 0 0 - -  Risk for fall due to : No Fall Risks History of fall(s) Impaired mobility - -  Follow up Falls evaluation completed Falls evaluation completed Falls evaluation completed - -    FALL RISK PREVENTION PERTAINING TO THE HOME:  Any stairs in or around the home? Yes  If so, are there any without handrails? No  Home free of loose throw rugs in walkways, pet beds, electrical cords, etc? Yes  Adequate lighting in your home to reduce risk of falls? Yes   ASSISTIVE DEVICES UTILIZED TO PREVENT FALLS:  Life alert? No  Use of a cane, walker or w/c? No  Grab bars in the bathroom? No  Shower chair or bench in shower? Yes  Elevated toilet seat or a handicapped toilet? Yes   TIMED UP AND GO:  Was the test performed? Yes .  Length of time to ambulate 10 feet: 6 sec.   Gait steady and fast without use of  assistive device  Cognitive Function: Normal cognitive status assessed by direct observation by this Nurse Health Advisor. No abnormalities found.         Immunizations Immunization History  Administered Date(s) Administered   Fluad Quad(high Dose 65+) 06/16/2019   Influenza, High Dose Seasonal PF 07/11/2016, 06/17/2017, 07/19/2020   Influenza-Unspecified 06/21/2014, 07/30/2018   PFIZER(Purple Top)SARS-COV-2 Vaccination 09/30/2019, 10/22/2019, 06/15/2020, 01/23/2021   Pneumococcal Conjugate-13 07/11/2016   Pneumococcal Polysaccharide-23 07/11/2017   Tdap 09/20/2010, 03/07/2021   Zoster Recombinat (Shingrix) 01/21/2017, 07/11/2017   Zoster, Live 10/19/2009    TDAP status: Up to date  Flu Vaccine status: Up to date  Pneumococcal vaccine status: Up to date  Covid-19 vaccine status: Completed vaccines  Qualifies for Shingles Vaccine? Yes   Zostavax completed Yes   Shingrix Completed?: Yes  Screening Tests Health Maintenance  Topic Date Due   INFLUENZA VACCINE  04/10/2021   COVID-19 Vaccine (5 - Booster for [place] series) 05/26/2021   COLONOSCOPY (Pts 45-61yr Insurance coverage will need to be confirmed)  02/12/2022   MAMMOGRAM  05/05/2023   TETANUS/TDAP  03/08/2031   DEXA SCAN  Completed   Hepatitis C Screening  Completed   PNA vac Low Risk Adult  Completed   Zoster Vaccines- Shingrix  Completed   HPV VACCINES  Aged Out    Health Maintenance  Health Maintenance Due  Topic Date Due   INFLUENZA VACCINE  04/10/2021    Colorectal cancer screening: Type of screening: Colonoscopy. Completed 02/12/2017. Repeat every 5 years  Mammogram status: Completed 05/04/2021. Repeat every year  Bone Density status: Completed 04/08/2019. Results reflect: Bone density results: OSTEOPOROSIS. Repeat every 2-3 years.  Lung Cancer Screening: (Low Dose CT Chest recommended if Age 72-80years, 30 pack-year currently smoking OR have quit w/in 15years.) does not qualify.   Lung Cancer  Screening Referral: no  Additional Screening:  Hepatitis C Screening: does qualify; Completed yes  Vision Screening: Recommended annual ophthalmology exams for early detection of glaucoma and other  disorders of the eye. Is the patient up to date with their annual eye exam?  Yes  Who is the provider or what is the name of the office in which the patient attends annual eye exams? Marica Otter, OD. If pt is not established with a provider, would they like to be referred to a provider to establish care? No .   Dental Screening: Recommended annual dental exams for proper oral hygiene  Community Resource Referral / Chronic Care Management: CRR required this visit?  No   CCM required this visit?  No      Plan:     I have personally reviewed and noted the following in the patient's chart:   Medical and social history Use of alcohol, tobacco or illicit drugs  Current medications and supplements including opioid prescriptions.  Functional ability and status Nutritional status Physical activity Advanced directives List of other physicians Hospitalizations, surgeries, and ER visits in previous 12 months Vitals Screenings to include cognitive, depression, and falls Referrals and appointments  In addition, I have reviewed and discussed with patient certain preventive protocols, quality metrics, and best practice recommendations. A written personalized care plan for preventive services as well as general preventive health recommendations were provided to patient.     Sheral Flow, LPN   [id number]   Nurse Notes:  Hearing Screening - Comments:: Patient denied any hearing difficulty.  No hearing aids. Vision Screening - Comments:: Patient wears contacts.  Eye exam done annually by Dr. Marica Otter.

## 2021-05-08 ENCOUNTER — Ambulatory Visit: Payer: Medicare Other | Admitting: Internal Medicine

## 2021-05-22 ENCOUNTER — Ambulatory Visit (INDEPENDENT_AMBULATORY_CARE_PROVIDER_SITE_OTHER): Payer: Medicare Other | Admitting: Internal Medicine

## 2021-05-22 ENCOUNTER — Encounter: Payer: Self-pay | Admitting: Internal Medicine

## 2021-05-22 ENCOUNTER — Other Ambulatory Visit: Payer: Self-pay

## 2021-05-22 VITALS — BP 116/78 | HR 78 | Ht 64.0 in | Wt 187.0 lb

## 2021-05-22 DIAGNOSIS — I341 Nonrheumatic mitral (valve) prolapse: Secondary | ICD-10-CM | POA: Insufficient documentation

## 2021-05-22 DIAGNOSIS — I340 Nonrheumatic mitral (valve) insufficiency: Secondary | ICD-10-CM

## 2021-05-22 DIAGNOSIS — I272 Pulmonary hypertension, unspecified: Secondary | ICD-10-CM

## 2021-05-22 NOTE — Patient Instructions (Signed)
Medication Instructions:  Your physician recommends that you continue on your current medications as directed. Please refer to the Current Medication list given to you today.  *If you need a refill on your cardiac medications before your next appointment, please call your pharmacy*   Lab Work: NONE If you have labs (blood work) drawn today and your tests are completely normal, you will receive your results only by: [place] (if you have MyChart) OR A paper copy in the mail If you have any lab test that is abnormal or we need to change your treatment, we will call you to review the results.   Testing/Procedures: NONE   Follow-Up: At [facility], you and your health needs are our priority.  As part of our continuing mission to provide you with exceptional heart care, we have created designated Provider Care Teams.  These Care Teams include your primary Cardiologist (physician) and Advanced Practice Providers (APPs -  Physician Assistants and Nurse Practitioners) who all work together to provide you with the care you need, when you need it.  We recommend signing up for the patient portal called "MyChart".  Sign up information is provided on this After Visit Summary.  MyChart is used to connect with patients for Virtual Visits (Telemedicine).  Patients are able to view lab/test results, encounter notes, upcoming appointments, etc.  Non-urgent messages can be sent to your provider as well.   To learn more about what you can do with MyChart, go to [email or web address].    Your next appointment:   1 year(s)  The format for your next appointment:   In Person  Provider:   You may see Rudean Haskell, MD or one of the following Advanced Practice Providers on your designated Care Team:   Melina Copa, PA-C Ermalinda Barrios, PA-C

## 2021-05-22 NOTE — Progress Notes (Signed)
Cardiology Office Note:    Date:  05/22/2021   ID:  Madison Rowe, DOB 01/19/1951, MRN [id number]  PCP:  Janith Lima, MD  [facility] Cardiologist:  Rudean Haskell MD [place] Electrophysiologist:  None   CC: Follow up [place] and MVP  History of Present Illness:    Madison Rowe is a 70 y.o. female with a hx of HLD, Obesity, Possible Eczema who presents for evaluation 08/17/20. In interim see by Dr. 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  Patient has 11/10/20  Echo Planned.  In interim of this visit, patient had only mild MR and normal RV function; still have bi-leaflet prolapse.  Seen 05/22/21.  Patient notes that she is doing great.  Since day last visit notes echocardiogram and PFTs looked OK changes.  Has started intermittent fasting and has lost 15 lbs. There are no interval hospital/ED visit.    No chest pain or pressure .  No SOB/DOE and no PND/Orthopnea.  Had lost 15 pounds and no leg swelling.  No palpitations or syncope.   Past Medical History:  Diagnosis Date   Allergy    Anemia    Arthritis    Osteoporosis 2010   Vitamin D deficient osteomalacia 2010    Past Surgical History:  Procedure Laterality Date   BREAST SURGERY     ECTOPIC PREGNANCY SURGERY     FOOT SURGERY Left    TONSILLECTOMY      Current Medications: Current Meds  Medication Sig   atorvastatin (LIPITOR) 10 MG tablet TAKE 1 TABLET(10 MG) BY MOUTH DAILY.   Cholecalciferol 50 MCG (2000 UT) TABS Take 1 tablet (2,000 Units total) by mouth daily.   clobetasol cream (TEMOVATE) 0.05 % APPLY EXTERNALLY TO THE AFFECTED AREA TWICE DAILY   conjugated estrogens (PREMARIN) vaginal cream Place vaginally as directed. 2-3 times weekly.   Dietary Management Product (FOSTEUM PLUS) CAPS Take 1 capsule by mouth 2 (two) times daily.   Ferric Maltol (ACCRUFER) 30 MG CAPS Take 1 capsule by mouth in the morning and at bedtime. (Patient taking differently: Take 1 capsule by mouth in the  morning and at bedtime. Take on empty stomach)   fluticasone (FLONASE) 50 MCG/ACT nasal spray Place 2 sprays into both nostrils daily.   furosemide (LASIX) 20 MG tablet Take 1 tablet (20 mg total) by mouth daily.   Ibuprofen 200 MG CAPS Take 400 mg by mouth in the morning and at bedtime.   omeprazole (PRILOSEC) 20 MG capsule Take 20 mg by mouth daily.   Vitamin E 200 units TABS      Allergies:   Alendronate sodium [alendronate sodium], Olmesartan, and Codeine   Social History   Socioeconomic History   Marital status: Married    Spouse name: Not on file   Number of children: Not on file   Years of education: Not on file   Highest education level: Not on file  Occupational History   Not on file  Tobacco Use   Smoking status: Never   Smokeless tobacco: Never  Substance and Sexual Activity   Alcohol use: Yes    Comment: Occasional   Drug use: No   Sexual activity: Yes    Birth control/protection: Surgical  Other Topics Concern   Not on file  Social History Narrative   Not on file   Social Determinants of Health   Financial Resource Strain: Low Risk    Difficulty of Paying Living Expenses: Not hard at all  Food Insecurity: No Food Insecurity   Worried About Charity fundraiser in the Last Year: Never true   Ran Out of Food in the Last Year: Never true  Transportation Needs: No Transportation Needs   Lack of Transportation (Medical): No   Lack of Transportation (Non-Medical): No  Physical Activity: Sufficiently Active   Days of Exercise per Week: 5 days   Minutes of Exercise per Session: 30 min  Stress: No Stress Concern Present   Feeling of Stress : Not at all  Social Connections: Socially Integrated   Frequency of Communication with Friends and Family: More than three times a week   Frequency of Social Gatherings with Friends and Family: More than three times a week   Attends Religious Services: More than 4 times per year   Active Member of [employer] or Organizations: Yes    Attends Music therapist: More than 4 times per year   Marital Status: Married    Social:  They are new grandparents (baby-girl)  Family History: The patient's family history includes Hypertension in her mother; Kidney disease in her father and maternal grandfather. There is no history of Cancer, Heart disease, Colon cancer, Esophageal cancer, Rectal cancer, or Stomach cancer. Mother had mitral valve prolapse and brother has atrial fibrillation  ROS:   Please see the history of present illness.    All other systems reviewed and are negative.  EKGs/Labs/Other Studies Reviewed:    The following studies were reviewed today:  EKG:  08/17/20: SR rate 70, RAE, RBBB  Transthoracic Echocardiogram: Date: 11/10/20 Results: bileaflet prolapse only mild MR  1. Left ventricular ejection fraction, by estimation, is 60 to 65%. The  left ventricle has normal function. The left ventricle has no regional  wall motion abnormalities. Left ventricular diastolic parameters are  consistent with Grade I diastolic  dysfunction (impaired relaxation).   2. Right ventricular systolic function is normal. The right ventricular  size is normal. Tricuspid regurgitation signal is inadequate for assessing  PA pressure.   3. 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 Mild mitral valve  regurgitation. No evidence of mitral stenosis.   4. The aortic valve was not well visualized. Aortic valve regurgitation  is not visualized. Mild aortic valve sclerosis is present, with no  evidence of aortic valve stenosis.   5. The inferior vena cava is normal in size with greater than 50%  respiratory variability, suggesting right atrial pressure of 3 mmHg.   Recent Labs: 08/15/2020: BUN 20; Creatinine, Ser 0.70; Magnesium 2.0; Potassium 3.8; Sodium 139  Recent Lipid Panel    Component Value Date/Time   CHOL 159 04/19/2020 1103   TRIG 51 04/19/2020 1103   HDL 69 04/19/2020 1103   CHOLHDL 2.3 04/19/2020  1103   VLDL 9.8 03/09/2019 1336   LDLCALC 77 04/19/2020 1103   LDLDIRECT 122.7 09/27/2011 1123    Physical Exam:    VS:  BP 116/78   Pulse 78   Ht '5\' 4"'$  (1.626 m)   Wt 187 lb (84.8 kg)   SpO2 95%   BMI 32.10 kg/m     Wt Readings from Last 3 Encounters:  05/22/21 187 lb (84.8 kg)  05/05/21 186 lb 6.4 oz (84.6 kg)  11/01/20 203 lb 12.8 oz (92.4 kg)     GEN: Obese, well developed in no acute distress HEENT: Normal NECK: No JVD  LYMPHATICS: No lymphadenopathy CARDIAC: regular rate and rhythm, no murmurs no rubs, gallops RESPIRATORY:  Clear to auscultation without rales, wheezing  or rhonchi  ABDOMEN: Soft, non-tender, non-distended MUSCULOSKELETAL:  No edema; No deformity  SKIN: Warm and dry NEUROLOGIC:  Alert and oriented x 3 PSYCHIATRIC:  Normal affect   ASSESSMENT:    1. Nonrheumatic mitral valve regurgitation   2. MVP (mitral valve prolapse)   3. Pulmonary hypertension, unspecified ([place])     PLAN:    In order of problems listed above:  Mitral Regurgitation with MVP Pulmonary Hypertension Obesity with intentional weight loss - WHO Functional Class I, Stage A-B, euvolemic, WHO II/III in the setting of MR and Obesiy - Diuretic regimen: continue 20 mg PO lasix - will get echo in 11/2022 unless new symptoms  Will plan for one year follow up unless new symptoms or abnormal test results warranting change in plan  Would be reasonable for  APP Follow up   Medication Adjustments/Labs and Tests Ordered: Current medicines are reviewed at length with the patient today.  Concerns regarding medicines are outlined above.  No orders of the defined types were placed in this encounter.  No orders of the defined types were placed in this encounter.   Patient Instructions  Medication Instructions:  Your physician recommends that you continue on your current medications as directed. Please refer to the Current Medication list given to you today.  *If you need a refill on  your cardiac medications before your next appointment, please call your pharmacy*   Lab Work: NONE If you have labs (blood work) drawn today and your tests are completely normal, you will receive your results only by: [place] (if you have MyChart) OR A paper copy in the mail If you have any lab test that is abnormal or we need to change your treatment, we will call you to review the results.   Testing/Procedures: NONE   Follow-Up: At [facility], you and your health needs are our priority.  As part of our continuing mission to provide you with exceptional heart care, we have created designated Provider Care Teams.  These Care Teams include your primary Cardiologist (physician) and Advanced Practice Providers (APPs -  Physician Assistants and Nurse Practitioners) who all work together to provide you with the care you need, when you need it.  We recommend signing up for the patient portal called "MyChart".  Sign up information is provided on this After Visit Summary.  MyChart is used to connect with patients for Virtual Visits (Telemedicine).  Patients are able to view lab/test results, encounter notes, upcoming appointments, etc.  Non-urgent messages can be sent to your provider as well.   To learn more about what you can do with MyChart, go to [email or web address].    Your next appointment:   1 year(s)  The format for your next appointment:   In Person  Provider:   You may see Rudean Haskell, MD or one of the following Advanced Practice Providers on your designated Care Team:   Melina Copa, PA-C Ermalinda Barrios, PA-C      Signed, Werner Lean, MD  05/22/2021 9:12 AM    [place]

## 2021-06-20 ENCOUNTER — Telehealth (INDEPENDENT_AMBULATORY_CARE_PROVIDER_SITE_OTHER): Payer: Medicare Other | Admitting: Internal Medicine

## 2021-06-20 ENCOUNTER — Encounter: Payer: Self-pay | Admitting: Internal Medicine

## 2021-06-20 VITALS — BP 127/70 | Wt 180.0 lb

## 2021-06-20 DIAGNOSIS — J069 Acute upper respiratory infection, unspecified: Secondary | ICD-10-CM | POA: Diagnosis not present

## 2021-06-20 MED ORDER — AMOXICILLIN 875 MG PO TABS: 875.0000 mg | ORAL_TABLET | Freq: Two times a day (BID) | ORAL | 0 refills | Status: DC

## 2021-06-20 NOTE — Progress Notes (Signed)
Subjective:    Patient ID: Madison Rowe, female    DOB: September 23, 1950, 70 y.o.   MRN: [id number]  DOS:  06/20/2021 Type of visit - description: Virtual Visit via Video Note  I connected with the above patient  by a video enabled telemedicine application and verified that I am speaking with the correct person using two identifiers.   THIS ENCOUNTER IS A VIRTUAL VISIT DUE TO COVID-19 - PATIENT WAS NOT SEEN IN THE OFFICE. PATIENT HAS CONSENTED TO VIRTUAL VISIT / TELEMEDICINE VISIT   Location of patient: home  Location of provider: office  Persons participating in the virtual visit: patient, provider   I discussed the limitations of evaluation and management by telemedicine and the availability of in person appointments. The patient expressed understanding and agreed to proceed.  Acute Symptoms a started 06/11/2021 after she returned from a trip: Sore throat, URI type of symptoms, husband has the same. She tested negative for COVID 06/14/2021 and today. At this point sore throat is better but she has a persisting head congestion with nasal discharge. Discharge is mostly clear with occasional yellowish color.  Denies fever chills No nausea or vomiting No myalgias No chest pain no difficulty breathing.   Review of Systems See above   Past Medical History:  Diagnosis Date   Allergy    Anemia    Arthritis    Osteoporosis 2010   Vitamin D deficient osteomalacia 2010    Past Surgical History:  Procedure Laterality Date   BREAST SURGERY     ECTOPIC PREGNANCY SURGERY     FOOT SURGERY Left    TONSILLECTOMY      Allergies as of 06/20/2021       Reactions   Alendronate Sodium [alendronate Sodium]    nausea   Olmesartan Other (See Comments)   quizzy   Codeine Nausea And Vomiting        Medication List        Accurate as of June 20, 2021  8:21 PM. If you have any questions, ask your nurse or doctor.          ACCRUFeR 30 MG Caps Generic drug: Ferric  Maltol Take 1 capsule by mouth in the morning and at bedtime. What changed: additional instructions   amoxicillin 875 MG tablet Commonly known as: AMOXIL Take 1 tablet (875 mg total) by mouth 2 (two) times daily. Started by: Kathlene November, MD   atorvastatin 10 MG tablet Commonly known as: LIPITOR TAKE 1 TABLET(10 MG) BY MOUTH DAILY.   Cholecalciferol 50 MCG (2000 UT) Tabs Take 1 tablet (2,000 Units total) by mouth daily.   clobetasol cream 0.05 % Commonly known as: TEMOVATE APPLY EXTERNALLY TO THE AFFECTED AREA TWICE DAILY   conjugated estrogens 0.625 MG/GM vaginal cream Commonly known as: PREMARIN Place vaginally as directed. 2-3 times weekly.   fluticasone 50 MCG/ACT nasal spray Commonly known as: FLONASE Place 2 sprays into both nostrils daily.   Fosteum Plus Caps Take 1 capsule by mouth 2 (two) times daily.   furosemide 20 MG tablet Commonly known as: Lasix Take 1 tablet (20 mg total) by mouth daily.   Ibuprofen 200 MG Caps Take 400 mg by mouth in the morning and at bedtime.   omeprazole 20 MG capsule Commonly known as: PRILOSEC Take 20 mg by mouth daily.   Vitamin E 200 units Tabs           Objective:   Physical Exam BP 127/70   Wt 180 lb (81.6  kg)   SpO2 96%   BMI 30.90 kg/m  Alert oriented x3, in no distress, speaking in complete sentences, no cough or chest congestion noted, but did have some nose congestion.      Assessment     70 year old female, PMH includes high cholesterol, thyroid disease, vitamin D deficiency, MVP, eczema, osteoporosis, presents with:  URI: Symptoms C/W a URI, she is currently using: Claritin, Mucinex, Sudafed (BP is okay today), Flonase. We agreed to continue doing the same, also add Astepro nasal spray. If not gradually better in the next few days she will start amoxicillin, Rx sent. Call anytime if symptoms severe. She verbalized understanding and took notes of my recommendations.    I discussed the assessment and  treatment plan with the patient. The patient was provided an opportunity to ask questions and all were answered. The patient agreed with the plan and demonstrated an understanding of the instructions.   The patient was advised to call back or seek an in-person evaluation if the symptoms worsen or if the condition fails to improve as anticipated.

## 2021-06-26 ENCOUNTER — Other Ambulatory Visit: Payer: Self-pay | Admitting: Internal Medicine

## 2021-06-26 DIAGNOSIS — E785 Hyperlipidemia, unspecified: Secondary | ICD-10-CM

## 2021-06-27 ENCOUNTER — Other Ambulatory Visit: Payer: Self-pay | Admitting: Internal Medicine

## 2021-06-27 ENCOUNTER — Telehealth: Payer: Self-pay | Admitting: Internal Medicine

## 2021-06-27 DIAGNOSIS — E785 Hyperlipidemia, unspecified: Secondary | ICD-10-CM

## 2021-06-27 MED ORDER — ATORVASTATIN CALCIUM 10 MG PO TABS: ORAL_TABLET | ORAL | 0 refills | Status: DC

## 2021-06-27 NOTE — Telephone Encounter (Signed)
Patient requesting short supply of atorvastatin (LIPITOR) 10 MG tablet  Patients' next appt is 08-02-2021 no sooner appt available  Patient last dose will be taken 07-05-2021

## 2021-06-27 NOTE — Telephone Encounter (Signed)
Pt informed Rx sent. 

## 2021-07-19 ENCOUNTER — Telehealth: Payer: Self-pay | Admitting: Pulmonary Disease

## 2021-07-19 DIAGNOSIS — I272 Pulmonary hypertension, unspecified: Secondary | ICD-10-CM

## 2021-07-19 DIAGNOSIS — R0609 Other forms of dyspnea: Secondary | ICD-10-CM

## 2021-07-19 MED ORDER — FUROSEMIDE 20 MG PO TABS: 20.0000 mg | ORAL_TABLET | Freq: Every day | ORAL | 1 refills | Status: DC

## 2021-07-19 NOTE — Telephone Encounter (Signed)
Refill has been sent to the pharmacy. Nothing further is needed. 

## 2021-07-24 ENCOUNTER — Telehealth: Payer: Self-pay | Admitting: Pulmonary Disease

## 2021-07-24 NOTE — Telephone Encounter (Signed)
I have called the patient and she has enough to make sure she had enough until her follow up. She did not have any other concerns.

## 2021-08-02 ENCOUNTER — Ambulatory Visit: Payer: Medicare Other | Admitting: Internal Medicine

## 2021-08-15 ENCOUNTER — Encounter: Payer: Self-pay | Admitting: Internal Medicine

## 2021-08-15 ENCOUNTER — Ambulatory Visit: Payer: Medicare Other

## 2021-08-15 ENCOUNTER — Other Ambulatory Visit: Payer: Self-pay

## 2021-08-15 ENCOUNTER — Ambulatory Visit (INDEPENDENT_AMBULATORY_CARE_PROVIDER_SITE_OTHER): Payer: Medicare Other

## 2021-08-15 ENCOUNTER — Ambulatory Visit (INDEPENDENT_AMBULATORY_CARE_PROVIDER_SITE_OTHER): Payer: Medicare Other | Admitting: Internal Medicine

## 2021-08-15 VITALS — BP 128/82 | HR 82 | Temp 97.8°F | Ht 64.0 in | Wt 183.0 lb

## 2021-08-15 DIAGNOSIS — E559 Vitamin D deficiency, unspecified: Secondary | ICD-10-CM | POA: Diagnosis not present

## 2021-08-15 DIAGNOSIS — R052 Subacute cough: Secondary | ICD-10-CM

## 2021-08-15 DIAGNOSIS — E785 Hyperlipidemia, unspecified: Secondary | ICD-10-CM | POA: Diagnosis not present

## 2021-08-15 DIAGNOSIS — I517 Cardiomegaly: Secondary | ICD-10-CM | POA: Diagnosis not present

## 2021-08-15 DIAGNOSIS — I10 Essential (primary) hypertension: Secondary | ICD-10-CM | POA: Insufficient documentation

## 2021-08-15 DIAGNOSIS — J208 Acute bronchitis due to other specified organisms: Secondary | ICD-10-CM | POA: Insufficient documentation

## 2021-08-15 DIAGNOSIS — E038 Other specified hypothyroidism: Secondary | ICD-10-CM

## 2021-08-15 DIAGNOSIS — E611 Iron deficiency: Secondary | ICD-10-CM | POA: Diagnosis not present

## 2021-08-15 DIAGNOSIS — U071 COVID-19: Secondary | ICD-10-CM

## 2021-08-15 DIAGNOSIS — R059 Cough, unspecified: Secondary | ICD-10-CM | POA: Diagnosis not present

## 2021-08-15 LAB — CBC WITH DIFFERENTIAL/PLATELET
Basophils Absolute: 0 10*3/uL (ref 0.0–0.1)
Basophils Relative: 0.6 % (ref 0.0–3.0)
Eosinophils Absolute: 0.1 10*3/uL (ref 0.0–0.7)
Eosinophils Relative: 1.6 % (ref 0.0–5.0)
HCT: 39.6 % (ref 36.0–46.0)
Hemoglobin: 12.9 g/dL (ref 12.0–15.0)
Lymphocytes Relative: 29.4 % (ref 12.0–46.0)
Lymphs Abs: 1.5 10*3/uL (ref 0.7–4.0)
MCHC: 32.6 g/dL (ref 30.0–36.0)
MCV: 91.1 fl (ref 78.0–100.0)
Monocytes Absolute: 0.6 10*3/uL (ref 0.1–1.0)
Monocytes Relative: 11.6 % (ref 3.0–12.0)
Neutro Abs: 2.8 10*3/uL (ref 1.4–7.7)
Neutrophils Relative %: 56.8 % (ref 43.0–77.0)
Platelets: 159 10*3/uL (ref 150.0–400.0)
RBC: 4.35 Mil/uL (ref 3.87–5.11)
RDW: 13.6 % (ref 11.5–15.5)
WBC: 5 10*3/uL (ref 4.0–10.5)

## 2021-08-15 LAB — BASIC METABOLIC PANEL
BUN: 16 mg/dL (ref 6–23)
CO2: 31 mEq/L (ref 19–32)
Calcium: 9.3 mg/dL (ref 8.4–10.5)
Chloride: 98 mEq/L (ref 96–112)
Creatinine, Ser: 0.76 mg/dL (ref 0.40–1.20)
GFR: 78.11 mL/min (ref >60.00)
Glucose, Bld: 80 mg/dL (ref 70–99)
Potassium: 3.5 mEq/L (ref 3.5–5.1)
Sodium: 137 mEq/L (ref 135–145)

## 2021-08-15 LAB — IBC + FERRITIN
Ferritin: 159.6 ng/mL (ref 10.0–291.0)
Iron: 67 ug/dL (ref 42–145)
Saturation Ratios: 19.9 % — ABNORMAL LOW (ref 20.0–50.0)
TIBC: 337.4 ug/dL (ref 250.0–450.0)
Transferrin: 241 mg/dL (ref 212.0–360.0)

## 2021-08-15 LAB — TSH: TSH: 2.65 u[IU]/mL (ref 0.35–5.50)

## 2021-08-15 LAB — SARS-COV-2 IGG: SARS-COV-2 IgG: 41.66

## 2021-08-15 LAB — HEPATIC FUNCTION PANEL
ALT: 17 U/L (ref 0–35)
AST: 22 U/L (ref 0–37)
Albumin: 4.3 g/dL (ref 3.5–5.2)
Alkaline Phosphatase: 68 U/L (ref 39–117)
Bilirubin, Direct: 0 mg/dL (ref 0.0–0.3)
Total Bilirubin: 0.5 mg/dL (ref 0.2–1.2)
Total Protein: 6.9 g/dL (ref 6.0–8.3)

## 2021-08-15 LAB — POC COVID19 BINAXNOW: SARS Coronavirus 2 Ag: NEGATIVE

## 2021-08-15 MED ORDER — ACCRUFER 30 MG PO CAPS: 1.0000 | ORAL_CAPSULE | Freq: Every day | ORAL | 1 refills | Status: DC

## 2021-08-15 MED ORDER — ATORVASTATIN CALCIUM 10 MG PO TABS: 10.0000 mg | ORAL_TABLET | Freq: Every day | ORAL | 1 refills | Status: DC

## 2021-08-15 NOTE — Progress Notes (Signed)
Subjective:  Patient ID: Madison Rowe, female    DOB: 12/13/1950  Age: 70 y.o. MRN: [id number]  CC: Cough, Anemia, Hyperlipidemia, and Hypothyroidism  This visit occurred during the SARS-CoV-2 public health emergency.  Safety protocols were in place, including screening questions prior to the visit, additional usage of staff PPE, and extensive cleaning of exam room while observing appropriate contact time as indicated for disinfecting solutions.    HPI Madison Rowe presents for f/up -  She complains of a 4-day history of sore throat, runny nose, and nonproductive cough.  She denies fever, chills, night sweats, shortness of breath, hemoptysis, or diaphoresis.  Outpatient Medications Prior to Visit  Medication Sig Dispense Refill   Cholecalciferol 50 MCG (2000 UT) TABS Take 1 tablet (2,000 Units total) by mouth daily. 90 tablet 1   clobetasol cream (TEMOVATE) 0.05 % APPLY EXTERNALLY TO THE AFFECTED AREA TWICE DAILY 60 g 1   conjugated estrogens (PREMARIN) vaginal cream Place vaginally as directed. 2-3 times weekly.     Dietary Management Product (FOSTEUM PLUS) CAPS Take 1 capsule by mouth 2 (two) times daily. 180 capsule 1   fluticasone (FLONASE) 50 MCG/ACT nasal spray Place 2 sprays into both nostrils daily.     Ibuprofen 200 MG CAPS Take 400 mg by mouth in the morning and at bedtime.     omeprazole (PRILOSEC) 20 MG capsule Take 20 mg by mouth daily.     Vitamin E 200 units TABS      atorvastatin (LIPITOR) 10 MG tablet TAKE 1 TABLET(10 MG) BY MOUTH DAILY 90 tablet 0   Ferric Maltol (ACCRUFER) 30 MG CAPS Take 1 capsule by mouth in the morning and at bedtime. (Patient taking differently: Take 1 capsule by mouth in the morning and at bedtime. Take on empty stomach) 180 capsule 1   furosemide (LASIX) 20 MG tablet Take 1 tablet (20 mg total) by mouth daily. 30 tablet 1   amoxicillin (AMOXIL) 875 MG tablet Take 1 tablet (875 mg total) by mouth 2 (two) times daily. 14 tablet 0   No  facility-administered medications prior to visit.    ROS Review of Systems  Constitutional:  Negative for chills, diaphoresis, fatigue and fever.  HENT:  Positive for postnasal drip, rhinorrhea and sore throat. Negative for congestion and sinus pressure.   Respiratory:  Positive for cough. Negative for chest tightness, shortness of breath and wheezing.   Cardiovascular:  Negative for chest pain, palpitations and leg swelling.  Gastrointestinal:  Negative for abdominal pain, constipation, diarrhea and vomiting.  Genitourinary: Negative.  Negative for difficulty urinating.  Musculoskeletal:  Negative for arthralgias and myalgias.  Skin: Negative.   Neurological:  Negative for dizziness, weakness and light-headedness.  Hematological:  Negative for adenopathy. Does not bruise/bleed easily.  Psychiatric/Behavioral: Negative.     Objective:  BP 128/82 (BP Location: Left Arm, Patient Position: Sitting, Cuff Size: Large)   Pulse 82   Temp 97.8 F (36.6 C) (Oral)   Ht 5\' 4"  (1.626 m)   Wt 183 lb (83 kg)   SpO2 94%   BMI 31.41 kg/m   BP Readings from Last 3 Encounters:  08/17/21 118/72  08/15/21 128/82  06/20/21 127/70    Wt Readings from Last 3 Encounters:  08/17/21 182 lb 3.2 oz (82.6 kg)  08/15/21 183 lb (83 kg)  06/20/21 180 lb (81.6 kg)    Physical Exam Vitals reviewed.  Constitutional:      Appearance: She is not ill-appearing.  HENT:  Nose: Nose normal.     Mouth/Throat:     Mouth: Mucous membranes are moist.  Eyes:     Conjunctiva/sclera: Conjunctivae normal.  Cardiovascular:     Rate and Rhythm: Normal rate and regular rhythm.     Heart sounds: No murmur heard. Pulmonary:     Effort: Pulmonary effort is normal.     Breath sounds: No stridor. No wheezing, rhonchi or rales.  Abdominal:     General: Abdomen is flat.     Palpations: There is no mass.     Tenderness: There is no abdominal tenderness. There is no guarding.     Hernia: No hernia is present.   Musculoskeletal:        General: Normal range of motion.     Cervical back: Neck supple.     Right lower leg: No edema.     Left lower leg: No edema.  Lymphadenopathy:     Cervical: No cervical adenopathy.  Skin:    General: Skin is warm and dry.  Neurological:     General: No focal deficit present.  Psychiatric:        Mood and Affect: Mood normal.        Behavior: Behavior normal.    Lab Results  Component Value Date   WBC 5.0 08/15/2021   HGB 12.9 08/15/2021   HCT 39.6 08/15/2021   PLT 159.0 08/15/2021   GLUCOSE 80 08/15/2021   CHOL 159 04/19/2020   TRIG 51 04/19/2020   HDL 69 04/19/2020   LDLDIRECT 122.7 09/27/2011   LDLCALC 77 04/19/2020   ALT 17 08/15/2021   AST 22 08/15/2021   NA 137 08/15/2021   K 3.5 08/15/2021   CL 98 08/15/2021   CREATININE 0.76 08/15/2021   BUN 16 08/15/2021   CO2 31 08/15/2021   TSH 2.65 08/15/2021    US Thyroid Biopsy  Result Date: 05/18/2020 INDICATION: Indeterminate left mid thyroid nodule EXAM: ULTRASOUND GUIDED FINE NEEDLE ASPIRATION OF INDETERMINATE THYROID NODULE COMPARISON:  US Thyroid 04/20/20 MEDICATIONS: 1% lidocaine 5 mL COMPLICATIONS: None immediate. TECHNIQUE: Informed written consent was obtained from the patient after a discussion of the risks, benefits and alternatives to treatment. Questions regarding the procedure were encouraged and answered. A timeout was performed prior to the initiation of the procedure. Pre-procedural ultrasound scanning demonstrated unchanged size and appearance of the indeterminate nodule within the left mid thyroid The procedure was planned. The neck was prepped in the usual sterile fashion, and a sterile drape was applied covering the operative field. A timeout was performed prior to the initiation of the procedure. Local anesthesia was provided with 1% lidocaine. Under direct ultrasound guidance, 5 FNA biopsies were performed of the left mid thyroid nodule with a 25 gauge needle. Multiple ultrasound  images were saved for procedural documentation purposes. The samples were prepared and submitted to pathology. Two of these samples were prepared for Afirma testing. Limited post procedural scanning was negative for hematoma or additional complication. Dressings were placed. The patient tolerated the above procedures procedure well without immediate postprocedural complication. FINDINGS: Nodule reference number based on prior diagnostic ultrasound: 1 Maximum size: 2.6 cm Location: Left; Mid ACR TI-RADS risk category: TR3 (3 points) Reason for biopsy: meets ACR TI-RADS criteria Ultrasound imaging confirms appropriate placement of the needles within the thyroid nodule. IMPRESSION: Technically successful ultrasound guided fine needle aspiration of left mid thyroid nodule. Read by: Soyla Dryer, NP Electronically Signed   By: Corrie Mckusick D.O.   On: 05/18/2020 17:05  DG Chest 2 View  Result Date: 08/15/2021 CLINICAL DATA:  Productive cough and chest congestion for 4 days. EXAM: CHEST - 2 VIEW COMPARISON:  05/12/2020 FINDINGS: Stable mild cardiomegaly. Aortic atherosclerotic calcification noted. Both lungs are clear. Small calcified granuloma again seen in the right lower lung. IMPRESSION: Stable mild cardiomegaly. No active lung disease. Electronically Signed   By: Marlaine Hind M.D.   On: 08/15/2021 12:02       Assessment & Plan:   Madison Rowe was seen today for cough, anemia, hyperlipidemia and hypothyroidism.  Diagnoses and all orders for this visit:  Other specified hypothyroidism- Her TSH is in the normal range. -     TSH; Future -     TSH  Iron deficiency- Her H&H and iron levels are normal now. -     Ferric Maltol (ACCRUFER) 30 MG CAPS; Take 1 capsule by mouth daily. -     CBC with Differential/Platelet; Future -     IBC + Ferritin; Future -     IBC + Ferritin -     CBC with Differential/Platelet  Dyslipidemia, goal LDL below 130- I will recheck her lipid panel. -     atorvastatin  (LIPITOR) 10 MG tablet; Take 1 tablet (10 mg total) by mouth daily. -     Hepatic function panel; Future -     Hepatic function panel  Vitamin D deficiency  Subacute cough- Her chest x-ray is negative for mass or infiltrate.  Her COVID antigen was negative but her antibody is positive.  I think she is having a mild COVID-19 infection.  She has presented too late to benefit from antivirals. -     SARS-COV-2 IgG; Future -     DG Chest 2 View; Future -     POC COVID-19 -     SARS-COV-2 IgG  Essential hypertension- Her blood pressure is adequately well controlled. -     Basic metabolic panel; Future -     TSH; Future -     Hepatic function panel; Future -     Hepatic function panel -     TSH -     Basic metabolic panel  Acute bronchitis due to COVID-19 virus- She is controlling her symptoms with over-the-counter meds.  I have discontinued Madison Rowe's amoxicillin. I have also changed her ACCRUFeR and atorvastatin. Additionally, I am having her maintain her conjugated estrogens, Cholecalciferol, Ibuprofen, Vitamin E, clobetasol cream, Fosteum Plus, fluticasone, and omeprazole.  Meds ordered this encounter  Medications   Ferric Maltol (ACCRUFER) 30 MG CAPS    Sig: Take 1 capsule by mouth daily.    Dispense:  90 capsule    Refill:  1   atorvastatin (LIPITOR) 10 MG tablet    Sig: Take 1 tablet (10 mg total) by mouth daily.    Dispense:  90 tablet    Refill:  1     Follow-up: Return if symptoms worsen or fail to improve.  Madison Calico, MD

## 2021-08-15 NOTE — Patient Instructions (Signed)

## 2021-08-17 ENCOUNTER — Other Ambulatory Visit: Payer: Self-pay

## 2021-08-17 ENCOUNTER — Ambulatory Visit (INDEPENDENT_AMBULATORY_CARE_PROVIDER_SITE_OTHER): Payer: Medicare Other | Admitting: Pulmonary Disease

## 2021-08-17 ENCOUNTER — Encounter: Payer: Self-pay | Admitting: Pulmonary Disease

## 2021-08-17 DIAGNOSIS — R0609 Other forms of dyspnea: Secondary | ICD-10-CM | POA: Diagnosis not present

## 2021-08-17 DIAGNOSIS — I272 Pulmonary hypertension, unspecified: Secondary | ICD-10-CM | POA: Diagnosis not present

## 2021-08-17 MED ORDER — FUROSEMIDE 20 MG PO TABS: 20.0000 mg | ORAL_TABLET | Freq: Every day | ORAL | 11 refills | Status: DC

## 2021-08-17 NOTE — Patient Instructions (Signed)
Nice to see you today!  I refilled the lasix - 20 mg daily. Take every day as you are. Recent labs look ok.   Return to clinic in 6 months or sooner as needed with Dr. Silas Flood

## 2021-08-17 NOTE — Progress Notes (Signed)
@Patient  ID: Madison Rowe, female    DOB: Dec 09, 1950, 70 y.o.   MRN: [id number]  Chief Complaint  Patient presents with   Follow-up    Pt states that her breathing is doing fine with no issues noted     Referring provider: Janith Lima, MD  HPI:    70 y.o. woman with shortness of breath previously seen by my pulmonary colleague who I am seeing in follow up for evaluation of pulmonary hypertension discovered on echocardiogram with normalization of estimated pressures after initiating lasix.  Remains well.  Dyspnea history.  Remains on Lasix 20 mg daily.  Recently seen by cardiology.  Note reviewed.  No real concern today.  Needs ongoing refills of Lasix.  HPI at initial visit: Patient chief complaint dyspnea on exertion.  Present for the last year or so.  Gradually worsened maybe over the last few months.  She notes in July 2021 she was at her second home near [place].  She does lower extremity swelling and took one of her husband's hydrochlorothiazide thinking it was a fluid pill.  Shortly thereafter she felt dizzy, lightheaded.  She went to local ED and they did imaging including a CT scan with contrast to eval for PE which was negative for clot, lower extremity Dopplers were negative for DVT.  CT scan showed per report bilateral pleural effusions as well as bilateral groundglass opacities and thickened of the bronchovascular bundles read as pulmonary edema.  No further work-up was done for this.  Daily, there is initially discovered thyroid nodule that CT scan for which she underwent thyroid biopsy in the ED.  She has ongoing dyspnea on exertion was referred to pulmonary.  Recently seen by one of my colleagues.  TTE was ordered and performed 06/08/2020 which is personally reviewed and demonstrates mild elevation of PA pressures, preserved RV function, mildly dilated RA, estimated RA pressure of 15 mmHg.  This also demonstrated mild to moderate mitral valve regurg.  She has not taken any  fluid pills per her report.  Chest x-ray 05/13/2020 reviewed and interpreted as clear lungs,.  PMH: Hyperlipidemia, thyroid nodule Surgical history: Thyroid biopsy Family history: Denies significant respiratory issues in first-degree relatives Social history: Never smoker, lives in [place], a second home at [place] spends a good amount of time there each year   Questionaires / Pulmonary Flowsheets:   ACT:  No flowsheet data found.  MMRC: No flowsheet data found.  Epworth:  No flowsheet data found.  Tests:   FENO:  No results found for: NITRICOXIDE  PFT: PFT Results Latest Ref Rng & Units 09/06/2020  FVC-Pre L 2.79  FVC-Predicted Pre % 98  FVC-Post L 2.74  FVC-Predicted Post % 97  Pre FEV1/FVC % % 82  Post FEV1/FCV % % 84  FEV1-Pre L 2.29  FEV1-Predicted Pre % 105  FEV1-Post L 2.31  DLCO uncorrected ml/min/mmHg 20.42  DLCO UNC% % 84  DLCO corrected ml/min/mmHg 20.42  DLCO COR %Predicted % 84  DLVA Predicted % 93  TLC L 4.92  TLC % Predicted % 97  RV % Predicted % 90   Personally reviewed and interpreted as normal PFTs WALK:  SIX MIN WALK 05/12/2020  Supplimental Oxygen during Test? (L/min) No  Tech Comments: fast pace/no SOB//lmr    Imaging: DG Chest 2 View  Result Date: 08/15/2021 CLINICAL DATA:  Productive cough and chest congestion for 4 days. EXAM: CHEST - 2 VIEW COMPARISON:  05/12/2020 FINDINGS: Stable mild cardiomegaly. Aortic atherosclerotic calcification noted.  Both lungs are clear. Small calcified granuloma again seen in the right lower lung. IMPRESSION: Stable mild cardiomegaly. No active lung disease. Electronically Signed   By: Marlaine Hind M.D.   On: 08/15/2021 12:02    Lab Results: Personally reviewed, normal renal function, highest absolute eosinophil count of 150 noted 04/19/2020, BNP normal 96 05/12/2020 CBC    Component Value Date/Time   WBC 5.0 08/15/2021 1142   RBC 4.35 08/15/2021 1142   HGB 12.9 08/15/2021 1142   HCT 39.6 08/15/2021 1142    PLT 159.0 08/15/2021 1142   MCV 91.1 08/15/2021 1142   MCH 26.1 (L) 04/19/2020 1103   MCHC 32.6 08/15/2021 1142   RDW 13.6 08/15/2021 1142   LYMPHSABS 1.5 08/15/2021 1142   MONOABS 0.6 08/15/2021 1142   EOSABS 0.1 08/15/2021 1142   BASOSABS 0.0 08/15/2021 1142    BMET    Component Value Date/Time   NA 137 08/15/2021 1142   NA 138 03/24/2018 0000   K 3.5 08/15/2021 1142   CL 98 08/15/2021 1142   CO2 31 08/15/2021 1142   GLUCOSE 80 08/15/2021 1142   BUN 16 08/15/2021 1142   BUN 15 03/24/2018 0000   CREATININE 0.76 08/15/2021 1142   CREATININE 0.67 04/19/2020 1103   CALCIUM 9.3 08/15/2021 1142   GFRNONAA 88 04/19/2020 1103   GFRAA 102 04/19/2020 1103    BNP No results found for: BNP  ProBNP    Component Value Date/Time   PROBNP 96.0 05/12/2020 1623    Specialty Problems       Pulmonary Problems   ALLERGIC RHINITIS   Acute bronchitis due to COVID-19 virus   Subacute cough    Allergies  Allergen Reactions   Alendronate Sodium [Alendronate Sodium]     nausea   Olmesartan Other (See Comments)    quizzy   Codeine Nausea And Vomiting    Immunization History  Administered Date(s) Administered   Fluad Quad(high Dose 65+) 06/16/2019   Influenza, High Dose Seasonal PF 07/11/2016, 06/17/2017, 07/19/2020   Influenza-Unspecified 06/21/2014, 07/30/2018, 07/25/2021   PFIZER(Purple Top)SARS-COV-2 Vaccination 09/30/2019, 10/22/2019, 06/15/2020, 01/23/2021   Pfizer Covid-19 Vaccine Bivalent Booster 89yrs & up 07/25/2021   Pneumococcal Conjugate-13 07/11/2016   Pneumococcal Polysaccharide-23 07/11/2017   Tdap 09/20/2010, 03/07/2021   Zoster Recombinat (Shingrix) 01/21/2017, 07/11/2017   Zoster, Live 10/19/2009    Past Medical History:  Diagnosis Date   Allergy    Anemia    Arthritis    Osteoporosis 2010   Vitamin D deficient osteomalacia 2010    Tobacco History: Social History   Tobacco Use  Smoking Status Never  Smokeless Tobacco Never   Counseling  given: Not Answered   Continue to not smoke  Outpatient Encounter Medications as of 08/17/2021  Medication Sig   atorvastatin (LIPITOR) 10 MG tablet Take 1 tablet (10 mg total) by mouth daily.   Cholecalciferol 50 MCG (2000 UT) TABS Take 1 tablet (2,000 Units total) by mouth daily.   clobetasol cream (TEMOVATE) 0.05 % APPLY EXTERNALLY TO THE AFFECTED AREA TWICE DAILY   conjugated estrogens (PREMARIN) vaginal cream Place vaginally as directed. 2-3 times weekly.   Dietary Management Product (FOSTEUM PLUS) CAPS Take 1 capsule by mouth 2 (two) times daily.   Ferric Maltol (ACCRUFER) 30 MG CAPS Take 1 capsule by mouth daily.   fluticasone (FLONASE) 50 MCG/ACT nasal spray Place 2 sprays into both nostrils daily.   Ibuprofen 200 MG CAPS Take 400 mg by mouth in the morning and at bedtime.   omeprazole (  PRILOSEC) 20 MG capsule Take 20 mg by mouth daily.   Vitamin E 200 units TABS    [DISCONTINUED] furosemide (LASIX) 20 MG tablet Take 1 tablet (20 mg total) by mouth daily.   furosemide (LASIX) 20 MG tablet Take 1 tablet (20 mg total) by mouth daily.   No facility-administered encounter medications on file as of 08/17/2021.     Review of Systems  n/a  Physical Exam  BP 118/72 (BP Location: Left Arm, Patient Position: Sitting, Cuff Size: Normal)   Pulse 80   Temp 98.4 F (36.9 C) (Oral)   Ht 5\' 4"  (1.626 m)   Wt 182 lb 3.2 oz (82.6 kg)   SpO2 97%   BMI 31.27 kg/m   Wt Readings from Last 5 Encounters:  08/17/21 182 lb 3.2 oz (82.6 kg)  08/15/21 183 lb (83 kg)  06/20/21 180 lb (81.6 kg)  05/22/21 187 lb (84.8 kg)  05/05/21 186 lb 6.4 oz (84.6 kg)    BMI Readings from Last 5 Encounters:  08/17/21 31.27 kg/m  08/15/21 31.41 kg/m  06/20/21 30.90 kg/m  05/22/21 32.10 kg/m  05/05/21 32.00 kg/m     Physical Exam General: Well-appearing, in no acute distress Eyes: EOMI, no icterus Neck: No JVP appreciated Respiratory: Clear to auscultation bilaterally, no  wheezes Cardiovascular: Regular rate rhythm, 2 out of 6 late systolic murmur heard best at left upper sternal border, no edema Neuro: Normal gait, no weakness Psych: Normal mood, full affect   Assessment & Plan:   Transient elevation of right-sided filling pressures of the heart: Based on mildly elevated PASP pressure in the setting of elevated right atrial pressure on recent TTE.  Highest concern for group 2 disease given mitral valve regurgitation seen on TTE.  No obvious risk factors for group 3 disease. PFTs normal. No signs or symptoms of connective tissue disease and no family history of the same.  No methamphetamine or other illicit substance use.  No history of DVT or PE to suggest chronic thromboembolic disease.  Given mild volume overload with elevated JVP on initial exam as well as elevated right atrial pressure on recent TTE,started lasix  20 mg oral daily 11/21. DOE better.  Repeat TTE with normalization of right-sided pressures after starting Lasix.    Dyspnea on exertion: Likely volume overload in setting of mitral valve regurgitation now resolved with euvolemia and stable Lasix dosing. PFTs normal.    Return in about 6 months (around 02/15/2022).   Lanier Clam, MD 08/17/2021

## 2021-08-31 ENCOUNTER — Encounter: Payer: Self-pay | Admitting: Internal Medicine

## 2021-08-31 ENCOUNTER — Other Ambulatory Visit: Payer: Self-pay | Admitting: Internal Medicine

## 2021-08-31 DIAGNOSIS — J3089 Other allergic rhinitis: Secondary | ICD-10-CM

## 2021-08-31 DIAGNOSIS — J0101 Acute recurrent maxillary sinusitis: Secondary | ICD-10-CM

## 2021-08-31 MED ORDER — AZELASTINE HCL 0.1 % NA SOLN: 2.0000 | Freq: Two times a day (BID) | NASAL | 1 refills | Status: DC

## 2021-08-31 MED ORDER — AMOXICILLIN-POT CLAVULANATE 875-125 MG PO TABS: 1.0000 | ORAL_TABLET | Freq: Two times a day (BID) | ORAL | 0 refills | Status: AC

## 2021-09-23 ENCOUNTER — Other Ambulatory Visit: Payer: Self-pay | Admitting: Internal Medicine

## 2021-09-23 DIAGNOSIS — E785 Hyperlipidemia, unspecified: Secondary | ICD-10-CM

## 2021-11-01 ENCOUNTER — Telehealth: Payer: Self-pay

## 2021-11-01 NOTE — Telephone Encounter (Signed)
Pt is requesting a refill on: Ferric Maltol (ACCRUFER) 30 MG CAPS Dietary Management Product ([place]) CAPS  Pharmacy: [facility], [place], [address]  LOV 08/15/21  Pt CB [phone]

## 2021-11-03 ENCOUNTER — Other Ambulatory Visit: Payer: Self-pay | Admitting: Internal Medicine

## 2021-11-03 DIAGNOSIS — E611 Iron deficiency: Secondary | ICD-10-CM

## 2021-11-03 DIAGNOSIS — M810 Age-related osteoporosis without current pathological fracture: Secondary | ICD-10-CM

## 2021-11-03 MED ORDER — ACCRUFER 30 MG PO CAPS: 1.0000 | ORAL_CAPSULE | Freq: Every day | ORAL | 1 refills | Status: DC

## 2021-11-03 MED ORDER — FOSTEUM PLUS PO CAPS: 1.0000 | ORAL_CAPSULE | Freq: Two times a day (BID) | ORAL | 1 refills | Status: DC

## 2021-11-22 NOTE — Progress Notes (Signed)
?Cardiology Office Note:   ? ?Date:  11/23/2021  ? ?ID:  Madison Rowe, DOB Jan 08, 1951, MRN [id number] ? ?PCP:  Janith Lima, MD ?  ?[facility] Providers ?Cardiologist:  None    ? ?Referring MD: Janith Lima, MD  ? ?Chief Complaint: feeling tired and having palpitations ? ?History of Present Illness:   ? ?Madison Rowe is a very pleasant  71 y.o. female with a hx of HTN, mitral valve prolapse, mitral valve regurgitation, pulmonary hypertension, hypothyroid, and hyperlipidemia.  ? ?She established care with our group seen by Dr. Gasper Sells on 08/17/20 for evaluation of dyspnea on exertion. She had recently been evaluated in [place], [place] and had CT notable for ground glass opacity but negative for PE. Echo done in [place] on 05/2020 revealed MVP with mild to moderate MR and elevated PASP. She was seen by pulmonology and felt to have Group II pulmonary hypertension from mild to moderate MR. Echo 11/2020 revealed mild MR and normal RV function, still bi-leaflet prolapse. With improvements from prior echo 05/2020, she was advised by pulm to continue diuretic therapy and follow-up as needed. She was last seen in our office on 05/22/21 by Dr. Gasper Sells with plans for return visit in 1 year and repeat echocardiogram 11/2022 unless clinically indicated prior.  ? ?Today, she is here alone for 55-monthfollow-up.  She reports that she is feeling a jittery feeling in her chest that just started recently and feels more fatigued. Continues to have stable shortness of breath when walking up hills and inclines, this has not worsened since last office visit 05/2021. She denies chest pain, lower extremity edema, diaphoresis, weakness, presyncope, syncope, orthopnea, or PND.  She is participating in yoga 2 days per week, low impact aerobics with resistance training 2 days per week and is walking 15-30 min several times per week, although she admits this is not consistent. She drinks only water, no caffeine. Cooking at home,  no canned foods, admits that she likes chocolate chip cookies.  Has not noticed increased palpitations with chocolate. ? ?Past Medical History:  ?Diagnosis Date  ? Allergy   ? Anemia   ? Arthritis   ? Osteoporosis 2010  ? Vitamin D deficient osteomalacia 2010  ? ? ?Past Surgical History:  ?Procedure Laterality Date  ? BREAST SURGERY    ? ECTOPIC PREGNANCY SURGERY    ? FOOT SURGERY Left   ? TONSILLECTOMY    ? ? ?Current Medications: ?Current Meds  ?Medication Sig  ? atorvastatin (LIPITOR) 10 MG tablet TAKE 1 TABLET(10 MG) BY MOUTH DAILY  ? Cholecalciferol 50 MCG (2000 UT) TABS Take 1 tablet (2,000 Units total) by mouth daily.  ? clobetasol cream (TEMOVATE) 0.05 % APPLY EXTERNALLY TO THE AFFECTED AREA TWICE DAILY  ? conjugated estrogens (PREMARIN) vaginal cream Place vaginally as directed. 2-3 times weekly.  ? Dietary Management Product (FOSTEUM PLUS) CAPS Take 1 capsule by mouth 2 (two) times daily.  ? Ferric Maltol (ACCRUFER) 30 MG CAPS Take 1 capsule by mouth daily.  ? fluticasone (FLONASE) 50 MCG/ACT nasal spray Place 2 sprays into both nostrils daily.  ? furosemide (LASIX) 20 MG tablet Take 1 tablet (20 mg total) by mouth daily.  ? Ibuprofen 200 MG CAPS Take 400 mg by mouth in the morning and at bedtime.  ? omeprazole (PRILOSEC) 20 MG capsule Take 20 mg by mouth daily.  ? Vitamin E 200 units TABS   ?  ? ?Allergies:   Alendronate sodium [alendronate sodium], Olmesartan, and  Codeine  ? ?Social History  ? ?Socioeconomic History  ? Marital status: Married  ?  Spouse name: Not on file  ? Number of children: Not on file  ? Years of education: Not on file  ? Highest education level: Not on file  ?Occupational History  ? Not on file  ?Tobacco Use  ? Smoking status: Never  ? Smokeless tobacco: Never  ?Substance and Sexual Activity  ? Alcohol use: Yes  ?  Comment: Occasional  ? Drug use: No  ? Sexual activity: Yes  ?  Birth control/protection: Surgical  ?Other Topics Concern  ? Not on file  ?Social History Narrative  ? Not  on file  ? ?Social Determinants of Health  ? ?Financial Resource Strain: Low Risk   ? Difficulty of Paying Living Expenses: Not hard at all  ?Food Insecurity: No Food Insecurity  ? Worried About Charity fundraiser in the Last Year: Never true  ? Ran Out of Food in the Last Year: Never true  ?Transportation Needs: No Transportation Needs  ? Lack of Transportation (Medical): No  ? Lack of Transportation (Non-Medical): No  ?Physical Activity: Sufficiently Active  ? Days of Exercise per Week: 5 days  ? Minutes of Exercise per Session: 30 min  ?Stress: No Stress Concern Present  ? Feeling of Stress : Not at all  ?Social Connections: Socially Integrated  ? Frequency of Communication with Friends and Family: More than three times a week  ? Frequency of Social Gatherings with Friends and Family: More than three times a week  ? Attends Religious Services: More than 4 times per year  ? Active Member of Clubs or Organizations: Yes  ? Attends Archivist Meetings: More than 4 times per year  ? Marital Status: Married  ?  ? ?Family History: ?The patient's family history includes Hypertension in her mother; Kidney disease in her father and maternal grandfather. There is no history of Cancer, Heart disease, Colon cancer, Esophageal cancer, Rectal cancer, or Stomach cancer. ? ?ROS:   ?Please see the history of present illness.    ?+ palpitations ?+ SOB on inclines ?All other systems reviewed and are negative. ? ?Labs/Other Studies Reviewed:   ? ?The following studies were reviewed today: ? ?Echo 11/10/20 ? ? ?Left Ventricle: Left ventricular ejection fraction, by estimation, is 60  ?to 65%. The left ventricle has normal function. The left ventricle has no  ?regional wall motion abnormalities. The left ventricular internal cavity  ?size was normal in size. There is no left ventricular hypertrophy. Abnormal (paradoxical) septal motion,  ?consistent with left bundle branch block. Left ventricular diastolic  ?parameters are  consistent with Grade I diastolic dysfunction (impaired  ?relaxation). Normal left ventricular filling pressure.  ?Right Ventricle: The right ventricular size is normal. No increase in  ?right ventricular wall thickness. Right ventricular systolic function is  ?normal. Tricuspid regurgitation signal is inadequate for assessing PA  ?pressure.  ?Left Atrium: Left atrial size was normal in size.  ?Right Atrium: Right atrial size was normal in size.  ?Pericardium: There is no evidence of pericardial effusion.  ?Mitral Valve: The mitral valve is normal in structure. Mild mitral valve  ?regurgitation. No evidence of mitral valve stenosis.  ?Tricuspid Valve: The tricuspid valve is normal in structure. Tricuspid  ?valve regurgitation is trivial. No evidence of tricuspid stenosis.  ?Aortic Valve: The aortic valve was not well visualized. Aortic valve  ?regurgitation is not visualized. Mild aortic valve sclerosis is present,  ?with no  evidence of aortic valve stenosis.  ?Pulmonic Valve: The pulmonic valve was normal in structure. Pulmonic valve  ?regurgitation is mild. No evidence of pulmonic stenosis.  ?Aorta: The aortic root is normal in size and structure.  ?Venous: The inferior vena cava is normal in size with greater than 50%  ?respiratory variability, suggesting right atrial pressure of 3 mmHg.  ?IAS/Shunts: No atrial level shunt detected by color flow Doppler.  ? ?Echo 05/2020 ? ?Left Ventricle: Left ventricular ejection fraction, by estimation, is 60  ?to 65%. The left ventricle has normal function. The left ventricle has no  ?regional wall motion abnormalities. The left ventricular internal cavity  ?size was normal in size. There is  ? no left ventricular hypertrophy. Left ventricular diastolic parameters  ?are consistent with Grade I diastolic dysfunction (impaired relaxation).  ?Right Ventricle: The right ventricular size is normal. No increase in  ?right ventricular wall thickness. Right ventricular systolic  function is  ?normal. There is moderately elevated pulmonary artery systolic pressure.  ?The tricuspid regurgitant velocity is  ?2.76 m/s, and with an assumed right atrial pressure of 15 mmHg, the  ?estimated

## 2021-11-23 ENCOUNTER — Encounter: Payer: Self-pay | Admitting: Nurse Practitioner

## 2021-11-23 ENCOUNTER — Ambulatory Visit (INDEPENDENT_AMBULATORY_CARE_PROVIDER_SITE_OTHER): Payer: Medicare PPO

## 2021-11-23 ENCOUNTER — Ambulatory Visit (INDEPENDENT_AMBULATORY_CARE_PROVIDER_SITE_OTHER): Payer: Medicare PPO | Admitting: Nurse Practitioner

## 2021-11-23 ENCOUNTER — Other Ambulatory Visit: Payer: Self-pay

## 2021-11-23 VITALS — BP 124/68 | HR 73 | Ht 64.0 in | Wt 185.0 lb

## 2021-11-23 DIAGNOSIS — I272 Pulmonary hypertension, unspecified: Secondary | ICD-10-CM

## 2021-11-23 DIAGNOSIS — E7849 Other hyperlipidemia: Secondary | ICD-10-CM | POA: Diagnosis not present

## 2021-11-23 DIAGNOSIS — I493 Ventricular premature depolarization: Secondary | ICD-10-CM

## 2021-11-23 DIAGNOSIS — R002 Palpitations: Secondary | ICD-10-CM | POA: Diagnosis not present

## 2021-11-23 DIAGNOSIS — I700 Atherosclerosis of aorta: Secondary | ICD-10-CM

## 2021-11-23 DIAGNOSIS — I4510 Unspecified right bundle-branch block: Secondary | ICD-10-CM

## 2021-11-23 DIAGNOSIS — I341 Nonrheumatic mitral (valve) prolapse: Secondary | ICD-10-CM

## 2021-11-23 DIAGNOSIS — I10 Essential (primary) hypertension: Secondary | ICD-10-CM

## 2021-11-23 DIAGNOSIS — I340 Nonrheumatic mitral (valve) insufficiency: Secondary | ICD-10-CM

## 2021-11-23 LAB — BASIC METABOLIC PANEL
BUN/Creatinine Ratio: 24 (ref 12–28)
BUN: 17 mg/dL (ref 8–27)
CO2: 27 mmol/L (ref 20–29)
Calcium: 9.5 mg/dL (ref 8.7–10.3)
Chloride: 100 mmol/L (ref 96–106)
Creatinine, Ser: 0.71 mg/dL (ref 0.57–1.00)
Glucose: 92 mg/dL (ref 70–99)
Potassium: 4.2 mmol/L (ref 3.5–5.2)
Sodium: 141 mmol/L (ref 134–144)
eGFR: 90 mL/min/{1.73_m2} (ref >59)

## 2021-11-23 LAB — MAGNESIUM: Magnesium: 2.2 mg/dL (ref 1.6–2.3)

## 2021-11-23 LAB — TSH: TSH: 3.18 u[IU]/mL (ref 0.450–4.500)

## 2021-11-23 NOTE — Patient Instructions (Signed)
Medication Instructions:  ?Your physician recommends that you continue on your current medications as directed. Please refer to the Current Medication list given to you today. ? ?*If you need a refill on your cardiac medications before your next appointment, please call your pharmacy* ? ? ?Lab Work: ?TSH, BMET, Mg Today ?If you have labs (blood work) drawn today and your tests are completely normal, you will receive your results only by: ?MyChart Message (if you have MyChart) OR ?A paper copy in the mail ?If you have any lab test that is abnormal or we need to change your treatment, we will call you to review the results. ? ? ?Testing/Procedures: ?14 day zio monitor ? ? ?Follow-Up: ?At [facility], you and your health needs are our priority.  As part of our continuing mission to provide you with exceptional heart care, we have created designated Provider Care Teams.  These Care Teams include your primary Cardiologist (physician) and Advanced Practice Providers (APPs -  Physician Assistants and Nurse Practitioners) who all work together to provide you with the care you need, when you need it. ? ?Your next appointment:   ?04/02/22 @ 8:40a ? ?The format for your next appointment:   ?In Person ? ?Provider:   ?Osborne Oman  ? ? ?Other Instructions ?ZIO XT- Long Term Monitor Instructions ? ?Your physician has requested you wear a ZIO patch monitor for 14 days.  ?This is a single patch monitor. [employer] supplies one patch monitor per enrollment. Additional ?stickers are not available. Please do not apply patch if you will be having a Nuclear Stress Test,  ?Echocardiogram, Cardiac CT, MRI, or Chest Xray during the period you would be wearing the  ?monitor. The patch cannot be worn during these tests. You cannot remove and re-apply the  ?ZIO XT patch monitor.  ?Your ZIO patch monitor will be mailed 3 day USPS to your address on file. It may take 3-5 days  ?to receive your monitor after you have been enrolled.  ?Once you  have received your monitor, please review the enclosed instructions. Your monitor  ?has already been registered assigning a specific monitor serial # to you. ? ?Billing and Patient Assistance Program Information ? ?We have supplied [employer] with any of your insurance information on file for billing purposes. ?[employer] offers a sliding scale Patient Assistance Program for patients that do not have  ?insurance, or whose insurance does not completely cover the cost of the ZIO monitor.  ?You must apply for the Patient Assistance Program to qualify for this discounted rate.  ?To apply, please call [employer] at [phone], select option 4, select option 2, ask to apply for  ?Patient Assistance Program. Theodore Demark will ask your household income, and how many people  ?are in your household. They will quote your out-of-pocket cost based on that information.  ?[employer] will also be able to set up a 83-month interest-free payment plan if needed. ? ?Applying the monitor ?  ?Shave hair from upper left chest.  ?Hold abrader disc by orange tab. Rub abrader in 40 strokes over the upper left chest as  ?indicated in your monitor instructions.  ?Clean area with 4 enclosed alcohol pads. Let dry.  ?Apply patch as indicated in monitor instructions. Patch will be placed under collarbone on left  ?side of chest with arrow pointing upward.  ?Rub patch adhesive wings for 2 minutes. Remove white label marked "1". Remove the white  ?label marked "2". Rub patch adhesive wings for 2 additional minutes.  ?While looking  in a mirror, press and release button in center of patch. A small green light will  ?flash 3-4 times. This will be your only indicator that the monitor has been turned on.  ?Do not shower for the first 24 hours. You may shower after the first 24 hours.  ?Press the button if you feel a symptom. You will hear a small click. Record Date, Time and  ?Symptom in the Patient Logbook.  ?When you are ready to remove the patch, follow  instructions on the last 2 pages of Patient  ?Logbook. Stick patch monitor onto the last page of Patient Logbook.  ?Place Patient Logbook in the blue and white box. Use locking tab on box and tape box closed  ?securely. The blue and white box has prepaid postage on it. Please place it in the mailbox as  ?soon as possible. Your physician should have your test results approximately 7 days after the  ?monitor has been mailed back to [facility].  ?Call [facility] at [phone] if you have questions regarding  ?your ZIO XT patch monitor. Call them immediately if you see an orange light blinking on your  ?monitor.  ?If your monitor falls off in less than 4 days, contact our Monitor department at [phone].  ?If your monitor becomes loose or falls off after 4 days call [employer] at [phone] for  ?suggestions on securing your monitor ?  ?

## 2021-11-23 NOTE — Progress Notes (Unsigned)
Zio XT given to patient to apply tom  3/17 ? ?Dr Julieanne Manson to read ?

## 2021-11-25 DIAGNOSIS — R002 Palpitations: Secondary | ICD-10-CM

## 2021-11-25 DIAGNOSIS — I493 Ventricular premature depolarization: Secondary | ICD-10-CM

## 2021-12-07 ENCOUNTER — Ambulatory Visit (INDEPENDENT_AMBULATORY_CARE_PROVIDER_SITE_OTHER): Payer: Medicare PPO

## 2021-12-07 ENCOUNTER — Encounter: Payer: Self-pay | Admitting: Podiatry

## 2021-12-07 ENCOUNTER — Ambulatory Visit: Payer: Medicare PPO | Admitting: Podiatry

## 2021-12-07 DIAGNOSIS — M778 Other enthesopathies, not elsewhere classified: Secondary | ICD-10-CM

## 2021-12-07 DIAGNOSIS — M76812 Anterior tibial syndrome, left leg: Secondary | ICD-10-CM | POA: Diagnosis not present

## 2021-12-07 MED ORDER — TRIAMCINOLONE ACETONIDE 10 MG/ML IJ SUSP
10.0000 mg | Freq: Once | INTRAMUSCULAR | Status: AC
  Administered 2021-12-07: 10 mg

## 2021-12-07 NOTE — Progress Notes (Signed)
Subjective:  ? ?Patient ID: Madison Rowe, female   DOB: 71 y.o.   MRN: [id number]  ? ?HPI ?Patient presents stating she is having a lot of pain in her medial arch left and states she knows she needs new orthotics and her orthotics that she has now need to be recovered ? ? ?ROS ? ? ?   ?Objective:  ?Physical Exam  ?Neurovascular status intact with inflammation around the anterior tibial tendon insertion left with moderate swelling fluid buildup no indication of muscle dysfunction.  Orthotics which have done very well for her but starting to wear down ? ?   ?Assessment:  ?Anterior tibial tendinitis left with inflammation at insertion and depression of the arch with good use of orthotics ? ?   ?Plan:  ?H&P reviewed conditions and I went ahead today did sterile prep and injected the sheath of the anterior tibial tendon 3 mg dexamethasone Kenalog 5 mg Xylocaine and I discussed continued orthotic usage and she will have new orthotics made and discussed with pedorthist.  When returned we will get her existing pair recovered ? ?X-rays left negative for signs of fracture or bony pathology associated with condition ?   ? ? ?

## 2021-12-08 ENCOUNTER — Telehealth: Payer: Self-pay

## 2021-12-08 NOTE — Telephone Encounter (Signed)
Work order for new foot orthotics sent to central fabrication ?

## 2022-01-04 ENCOUNTER — Ambulatory Visit: Payer: Medicare PPO

## 2022-01-04 DIAGNOSIS — M76812 Anterior tibial syndrome, left leg: Secondary | ICD-10-CM

## 2022-01-04 DIAGNOSIS — M778 Other enthesopathies, not elsewhere classified: Secondary | ICD-10-CM

## 2022-01-04 NOTE — Progress Notes (Signed)
SITUATION: ?Reason for Visit: Fitting and Delivery of Custom Fabricated Foot Orthoses ?Patient Report: Patient reports comfort and is satisfied with device. ? ?OBJECTIVE DATA: ?Patient History / Diagnosis:   ?  ICD-10-CM   ?1. Extensor tendinitis of foot  M77.8   ?  ?2. Anterior tibialis tendinitis of left lower extremity  M76.812   ?  ? ? ?Provided Device:  Custom Functional Foot Orthotics ?    [employer]: [id number] ? ?GOAL OF ORTHOSIS ?- Improve gait ?- Decrease energy expenditure ?- Improve Balance ?- Provide Triplanar stability of foot complex ?- Facilitate motion ? ?ACTIONS PERFORMED ?Patient was fit with foot orthotics trimmed to shoe last. Patient tolerated fittign procedure.  ? ?Patient was provided with verbal and written instruction and demonstration regarding donning, doffing, wear, care, proper fit, function, purpose, cleaning, and use of the orthosis and in all related precautions and risks and benefits regarding the orthosis. ? ?Patient was also provided with verbal instruction regarding how to report any failures or malfunctions of the orthosis and necessary follow up care. Patient was also instructed to contact our office regarding any change in status that may affect the function of the orthosis. ? ?Patient demonstrated independence with proper donning, doffing, and fit and verbalized understanding of all instructions. ? ?PLAN: ?Patient is to follow up in one week or as necessary (PRN). All questions were answered and concerns addressed. Plan of care was discussed with and agreed upon by the patient. ? ?

## 2022-03-29 ENCOUNTER — Encounter: Payer: Self-pay | Admitting: Internal Medicine

## 2022-03-31 NOTE — Progress Notes (Unsigned)
Cardiology Office Note:    Date:  04/02/2022   ID:  Madison Rowe, DOB 04-09-1951, MRN [id number]  PCP:  Janith Lima, MD  [facility] Cardiologist:  Rudean Haskell MD [place] Electrophysiologist:  None   CC: Follow up PVCs  History of Present Illness:    Madison Rowe is a 71 y.o. female with a hx of HLD, Obesity, Possible Eczema who presents for evaluation 08/17/20.  2022:  Seen by Dr. 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   2023: Occasional PVCs, did not do 2023 Echo ordered by NP  Patient notes that she is doing great.   Since last visit notes that she was in the [facility] and then to a [employer] and just got back . There are no interval hospital/ED visit.    No chest pain or pressure .  No SOB/DOE only with significant incline and no PND/Orthopnea.  No weight gain or leg swelling.  No palpitations or syncope.    Past Medical History:  Diagnosis Date   Allergy    Anemia    Arthritis    Osteoporosis 2010   Vitamin D deficient osteomalacia 2010    Past Surgical History:  Procedure Laterality Date   BREAST SURGERY     ECTOPIC PREGNANCY SURGERY     FOOT SURGERY Left    TONSILLECTOMY      Current Medications: Current Meds  Medication Sig   aspirin 325 MG tablet as needed for mild pain or moderate pain.   Dermatological Products, Misc. (DERMEND BRUISE FORMULA) LOTN as needed. bruising     Allergies:   Alendronate sodium [alendronate sodium], Olmesartan, and Codeine   Social History   Socioeconomic History   Marital status: Married    Spouse name: Not on file   Number of children: Not on file   Years of education: Not on file   Highest education level: Not on file  Occupational History   Not on file  Tobacco Use   Smoking status: Never   Smokeless tobacco: Never  Substance and Sexual Activity   Alcohol use: Yes    Comment: Occasional   Drug use: No   Sexual activity: Yes    Birth  control/protection: Surgical  Other Topics Concern   Not on file  Social History Narrative   Not on file   Social Determinants of Health   Financial Resource Strain: Low Risk  (05/05/2021)   Overall Financial Resource Strain (CARDIA)    Difficulty of Paying Living Expenses: Not hard at all  Food Insecurity: No Food Insecurity (05/05/2021)   Hunger Vital Sign    Worried About Running Out of Food in the Last Year: Never true    [place] in the Last Year: Never true  Transportation Needs: No Transportation Needs (05/05/2021)   PRAPARE - Hydrologist (Medical): No    Lack of Transportation (Non-Medical): No  Physical Activity: Sufficiently Active (05/05/2021)   Exercise Vital Sign    Days of Exercise per Week: 5 days    Minutes of Exercise per Session: 30 min  Stress: No Stress Concern Present (05/05/2021)   [place]    Feeling of Stress : Not at all  Social Connections: Socially Integrated (05/05/2021)   Social Connection and Isolation Panel [NHANES]    Frequency of Communication with Friends and Family: More than three times a week  Frequency of Social Gatherings with Friends and Family: More than three times a week    Attends Religious Services: More than 4 times per year    Active Member of [employer] or Organizations: Yes    Attends Music therapist: More than 4 times per year    Marital Status: Married    Social:  Found out they are going to be grandparents When to a cruise  Family History: The patient's family history includes Hypertension in her mother; Kidney disease in her father and maternal grandfather. There is no history of Cancer, Heart disease, Colon cancer, Esophageal cancer, Rectal cancer, or Stomach cancer. Mother had mitral valve prolapse and brother has atrial fibrillation  ROS:   Please see the history of present illness.    All other systems  reviewed and are negative.  EKGs/Labs/Other Studies Reviewed:    The following studies were reviewed today:  EKG:  04/02/22 SR RBBB no PVCs 08/17/20: SR rate 70, RAE, RBBB  Transthoracic Echocardiogram: Date: 06/08/2020 Results: Mild to Moderate MR related to prolapse without signifcant LA dilation IMPRESSIONS   1. Left ventricular ejection fraction, by estimation, is 60 to 65%. The  left ventricle has normal function. The left ventricle has no regional  wall motion abnormalities. Left ventricular diastolic parameters are  consistent with Grade I diastolic  dysfunction (impaired relaxation).   2. Right ventricular systolic function is normal. The right ventricular  size is normal. There is moderately elevated pulmonary artery systolic  pressure. The estimated right ventricular systolic pressure is 40.9 mmHg.   3. Right atrial size was mildly dilated.   4. Bileaflet mitral valve prolapse, posterior leaflet greater than  anterior leaflet. The mitral valve is myxomatous. Mild to moderate mitral  valve regurgitation. No evidence of mitral stenosis.   5. The aortic valve is tricuspid. There is mild thickening of the aortic  valve. Aortic valve regurgitation is not visualized. No aortic stenosis is  present.   6. The inferior vena cava is dilated in size with <50% respiratory  variability, suggesting right atrial pressure of 15 mmHg.  Recent Labs: 08/15/2021: ALT 17; Hemoglobin 12.9; Platelets 159.0 11/23/2021: BUN 17; Creatinine, Ser 0.71; Magnesium 2.2; Potassium 4.2; Sodium 141; TSH 3.180  Recent Lipid Panel    Component Value Date/Time   CHOL 159 04/19/2020 1103   TRIG 51 04/19/2020 1103   HDL 69 04/19/2020 1103   CHOLHDL 2.3 04/19/2020 1103   VLDL 9.8 03/09/2019 1336   LDLCALC 77 04/19/2020 1103   LDLDIRECT 122.7 09/27/2011 1123     Physical Exam:    VS:  Pulse 71   Ht '5\' 4"'$  (1.626 m)   Wt 183 lb (83 kg)   SpO2 97%   BMI 31.41 kg/m     Wt Readings from Last 3  Encounters:  04/02/22 183 lb (83 kg)  11/23/21 185 lb (83.9 kg)  08/17/21 182 lb 3.2 oz (82.6 kg)    Gen: No distress  Neck: No JVD Cardiac: No Rubs or Gallops, soft systolic murmur RRR +2 radial pulses Respiratory: Clear to auscultation bilaterally, normal effort, normal  respiratory rate GI: Soft, nontender, non-distended  MS: No  edema; moves all extremities Integument: Skin feels warm Neuro:  At time of evaluation, alert and oriented to person/place/time/situation  Psych: Normal affect, patient feels well    ASSESSMENT:    1. Nonrheumatic mitral valve regurgitation   2. Pulmonary hypertension, unspecified ([place])   3. PVC (premature ventricular contraction)   4. 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  Concerns regarding medicines are outlined above.  Orders Placed This Encounter  Procedures   EKG 12-Lead   ECHOCARDIOGRAM COMPLETE   No orders of the defined types were placed in this encounter.   Patient Instructions  Medication Instructions:  Your physician recommends that you continue on your current medications as directed. Please refer to the Current Medication list given to you today.  *If you need a refill on your cardiac medications before your next appointment, please call your pharmacy*   Lab Work: NONE If you have labs (blood work) drawn today and your tests are completely normal, you will receive your results  only by: [place] (if you have MyChart) OR A paper copy in the mail If you have any lab test that is abnormal or we need to change your treatment, we will call you to review the results.   Testing/Procedures: July 2024- Your physician has requested that you have an echocardiogram. Echocardiography is a painless test that uses sound waves to create images of your heart. It provides your doctor with information about the size and shape of your heart and how well your heart's chambers and valves are working. This procedure takes approximately one hour. There are no restrictions for this procedure.    Follow-Up: At [facility], you and your health needs are our priority.  As part of our continuing mission to provide you with exceptional heart care, we have created designated Provider Care Teams.  These Care Teams include your primary Cardiologist (physician) and Advanced Practice Providers (APPs -  Physician Assistants and Nurse Practitioners) who all work together to provide you with the care you need, when you need it.   Your next appointment:   1 year(s)  The format for your next appointment:   In Person  Provider:   Werner Lean, MD     Other Instructions The test that Dr. Gasper Sells offered you is called a Coronary Artery Calcium Score Test.   non-invasive CT for Calcium Scoring of your heart. It will calculate your risk of developing Coronary Artery Disease (CAD) by measuring the amount of buildup of calcium in the plaque in the coronary arteries (arteries surrounding your heart).    Important Information About Sugar         Signed, Werner Lean, MD  04/02/2022 9:46 AM    [facility]

## 2022-04-02 ENCOUNTER — Encounter: Payer: Self-pay | Admitting: Internal Medicine

## 2022-04-02 ENCOUNTER — Ambulatory Visit: Payer: Medicare PPO | Admitting: Internal Medicine

## 2022-04-02 VITALS — HR 71 | Ht 64.0 in | Wt 183.0 lb

## 2022-04-02 DIAGNOSIS — E782 Mixed hyperlipidemia: Secondary | ICD-10-CM | POA: Diagnosis not present

## 2022-04-02 DIAGNOSIS — I340 Nonrheumatic mitral (valve) insufficiency: Secondary | ICD-10-CM

## 2022-04-02 DIAGNOSIS — I493 Ventricular premature depolarization: Secondary | ICD-10-CM | POA: Insufficient documentation

## 2022-04-02 DIAGNOSIS — I272 Pulmonary hypertension, unspecified: Secondary | ICD-10-CM

## 2022-04-02 NOTE — Patient Instructions (Signed)
Medication Instructions:  Your physician recommends that you continue on your current medications as directed. Please refer to the Current Medication list given to you today.  *If you need a refill on your cardiac medications before your next appointment, please call your pharmacy*   Lab Work: NONE If you have labs (blood work) drawn today and your tests are completely normal, you will receive your results only by: [place] (if you have MyChart) OR A paper copy in the mail If you have any lab test that is abnormal or we need to change your treatment, we will call you to review the results.   Testing/Procedures: July 2024- Your physician has requested that you have an echocardiogram. Echocardiography is a painless test that uses sound waves to create images of your heart. It provides your doctor with information about the size and shape of your heart and how well your heart's chambers and valves are working. This procedure takes approximately one hour. There are no restrictions for this procedure.    Follow-Up: At [facility], you and your health needs are our priority.  As part of our continuing mission to provide you with exceptional heart care, we have created designated Provider Care Teams.  These Care Teams include your primary Cardiologist (physician) and Advanced Practice Providers (APPs -  Physician Assistants and Nurse Practitioners) who all work together to provide you with the care you need, when you need it.   Your next appointment:   1 year(s)  The format for your next appointment:   In Person  Provider:   Werner Lean, MD     Other Instructions The test that Dr. Gasper Sells offered you is called a Coronary Artery Calcium Score Test.   non-invasive CT for Calcium Scoring of your heart. It will calculate your risk of developing Coronary Artery Disease (CAD) by measuring the amount of buildup of calcium in the plaque in the coronary arteries  (arteries surrounding your heart).    Important Information About Sugar

## 2022-04-09 ENCOUNTER — Telehealth: Payer: Self-pay

## 2022-04-09 DIAGNOSIS — E782 Mixed hyperlipidemia: Secondary | ICD-10-CM

## 2022-04-09 NOTE — Telephone Encounter (Signed)
Received a message from scheduler that pt hung up before he Madison Rowe) could transfer call.  Called pt to assess needs.  Pt would like to have Coronary Calcium Score test.  MD offered test at last OV 04/02/22 pt wanted to research test before deciding. Pt now would like to schedule test.  Order placed advised that scheduler will call to set up appointment.

## 2022-05-07 ENCOUNTER — Ambulatory Visit: Payer: Medicare PPO | Admitting: Podiatry

## 2022-05-07 ENCOUNTER — Ambulatory Visit (INDEPENDENT_AMBULATORY_CARE_PROVIDER_SITE_OTHER): Payer: Medicare PPO

## 2022-05-07 ENCOUNTER — Encounter: Payer: Self-pay | Admitting: Podiatry

## 2022-05-07 VITALS — Ht 64.0 in | Wt 180.0 lb

## 2022-05-07 DIAGNOSIS — Z0000 Encounter for general adult medical examination without abnormal findings: Secondary | ICD-10-CM | POA: Diagnosis not present

## 2022-05-07 DIAGNOSIS — M76812 Anterior tibial syndrome, left leg: Secondary | ICD-10-CM | POA: Diagnosis not present

## 2022-05-07 DIAGNOSIS — Z1211 Encounter for screening for malignant neoplasm of colon: Secondary | ICD-10-CM | POA: Diagnosis not present

## 2022-05-07 MED ORDER — TRIAMCINOLONE ACETONIDE 10 MG/ML IJ SUSP
10.0000 mg | Freq: Once | INTRAMUSCULAR | Status: AC
  Administered 2022-05-07: 10 mg

## 2022-05-07 NOTE — Patient Instructions (Signed)
Madison Rowe , Thank you for taking time to come for your Medicare Wellness Visit. I appreciate your ongoing commitment to your health goals. Please review the following plan we discussed and let me know if I can assist you in the future.   Screening recommendations/referrals: Colonoscopy: referral 05/07/2022 Mammogram: appointment 05/22/2022 Bone Density: appointment 05/22/2022 Recommended yearly ophthalmology/optometry visit for glaucoma screening and checkup Recommended yearly dental visit for hygiene and checkup  Vaccinations: Influenza vaccine: completed  Pneumococcal vaccine: completed  Tdap vaccine: 03/07/2021 Shingles vaccine: completed    Covid-19:completed   Advanced directives: yes   Conditions/risks identified: Aim for 30 minutes of exercise or brisk walking, 6-8 glasses of water, and 5 servings of fruits and vegetables each day.   Next appointment: Follow up in one year for your annual wellness visit    Preventive Care 65 Years and Older, Female Preventive care refers to lifestyle choices and visits with your health care provider that can promote health and wellness. What does preventive care include? A yearly physical exam. This is also called an annual well check. Dental exams once or twice a year. Routine eye exams. Ask your health care provider how often you should have your eyes checked. Personal lifestyle choices, including: Daily care of your teeth and gums. Regular physical activity. Eating a healthy diet. Avoiding tobacco and drug use. Limiting alcohol use. Practicing safe sex. Taking low-dose aspirin every day. Taking vitamin and mineral supplements as recommended by your health care provider. What happens during an annual well check? The services and screenings done by your health care provider during your annual well check will depend on your age, overall health, lifestyle risk factors, and family history of disease. Counseling  Your health care provider  may ask you questions about your: Alcohol use. Tobacco use. Drug use. Emotional well-being. Home and relationship well-being. Sexual activity. Eating habits. History of falls. Memory and ability to understand (cognition). Work and work Statistician. Reproductive health. Screening  You may have the following tests or measurements: Height, weight, and BMI. Blood pressure. Lipid and cholesterol levels. These may be checked every 5 years, or more frequently if you are over 73 years old. Skin check. Lung cancer screening. You may have this screening every year starting at age 73 if you have a 30-pack-year history of smoking and currently smoke or have quit within the past 15 years. Fecal occult blood test (FOBT) of the stool. You may have this test every year starting at age 20. Flexible sigmoidoscopy or colonoscopy. You may have a sigmoidoscopy every 5 years or a colonoscopy every 10 years starting at age 2. Hepatitis C blood test. Hepatitis B blood test. Sexually transmitted disease (STD) testing. Diabetes screening. This is done by checking your blood sugar (glucose) after you have not eaten for a while (fasting). You may have this done every 1-3 years. Bone density scan. This is done to screen for osteoporosis. You may have this done starting at age 8. Mammogram. This may be done every 1-2 years. Talk to your health care provider about how often you should have regular mammograms. Talk with your health care provider about your test results, treatment options, and if necessary, the need for more tests. Vaccines  Your health care provider may recommend certain vaccines, such as: Influenza vaccine. This is recommended every year. Tetanus, diphtheria, and acellular pertussis (Tdap, Td) vaccine. You may need a Td booster every 10 years. Zoster vaccine. You may need this after age 26. Pneumococcal 13-valent conjugate (PCV13) vaccine.  One dose is recommended after age 55. Pneumococcal  polysaccharide (PPSV23) vaccine. One dose is recommended after age 26. Talk to your health care provider about which screenings and vaccines you need and how often you need them. This information is not intended to replace advice given to you by your health care provider. Make sure you discuss any questions you have with your health care provider. Document Released: 09/23/2015 Document Revised: 05/16/2016 Document Reviewed: 06/28/2015 Elsevier Interactive Patient Education  2017 [employer] Prevention in the Home Falls can cause injuries. They can happen to people of all ages. There are many things you can do to make your home safe and to help prevent falls. What can I do on the outside of my home? Regularly fix the edges of walkways and driveways and fix any cracks. Remove anything that might make you trip as you walk through a door, such as a raised step or threshold. Trim any bushes or trees on the path to your home. Use bright outdoor lighting. Clear any walking paths of anything that might make someone trip, such as rocks or tools. Regularly check to see if handrails are loose or broken. Make sure that both sides of any steps have handrails. Any raised decks and porches should have guardrails on the edges. Have any leaves, snow, or ice cleared regularly. Use sand or salt on walking paths during winter. Clean up any spills in your garage right away. This includes oil or grease spills. What can I do in the bathroom? Use night lights. Install grab bars by the toilet and in the tub and shower. Do not use towel bars as grab bars. Use non-skid mats or decals in the tub or shower. If you need to sit down in the shower, use a plastic, non-slip stool. Keep the floor dry. Clean up any water that spills on the floor as soon as it happens. Remove soap buildup in the tub or shower regularly. Attach bath mats securely with double-sided non-slip rug tape. Do not have throw rugs and other  things on the floor that can make you trip. What can I do in the bedroom? Use night lights. Make sure that you have a light by your bed that is easy to reach. Do not use any sheets or blankets that are too big for your bed. They should not hang down onto the floor. Have a firm chair that has side arms. You can use this for support while you get dressed. Do not have throw rugs and other things on the floor that can make you trip. What can I do in the kitchen? Clean up any spills right away. Avoid walking on wet floors. Keep items that you use a lot in easy-to-reach places. If you need to reach something above you, use a strong step stool that has a grab bar. Keep electrical cords out of the way. Do not use floor polish or wax that makes floors slippery. If you must use wax, use non-skid floor wax. Do not have throw rugs and other things on the floor that can make you trip. What can I do with my stairs? Do not leave any items on the stairs. Make sure that there are handrails on both sides of the stairs and use them. Fix handrails that are broken or loose. Make sure that handrails are as long as the stairways. Check any carpeting to make sure that it is firmly attached to the stairs. Fix any carpet that is loose or  worn. Avoid having throw rugs at the top or bottom of the stairs. If you do have throw rugs, attach them to the floor with carpet tape. Make sure that you have a light switch at the top of the stairs and the bottom of the stairs. If you do not have them, ask someone to add them for you. What else can I do to help prevent falls? Wear shoes that: Do not have high heels. Have rubber bottoms. Are comfortable and fit you well. Are closed at the toe. Do not wear sandals. If you use a stepladder: Make sure that it is fully opened. Do not climb a closed stepladder. Make sure that both sides of the stepladder are locked into place. Ask someone to hold it for you, if possible. Clearly  mark and make sure that you can see: Any grab bars or handrails. First and last steps. Where the edge of each step is. Use tools that help you move around (mobility aids) if they are needed. These include: Canes. Walkers. Scooters. Crutches. Turn on the lights when you go into a dark area. Replace any light bulbs as soon as they burn out. Set up your furniture so you have a clear path. Avoid moving your furniture around. If any of your floors are uneven, fix them. If there are any pets around you, be aware of where they are. Review your medicines with your doctor. Some medicines can make you feel dizzy. This can increase your chance of falling. Ask your doctor what other things that you can do to help prevent falls. This information is not intended to replace advice given to you by your health care provider. Make sure you discuss any questions you have with your health care provider. Document Released: 06/23/2009 Document Revised: 02/02/2016 Document Reviewed: 10/01/2014 Elsevier Interactive Patient Education  2017 [employer].

## 2022-05-07 NOTE — Progress Notes (Cosign Needed Addendum)
Subjective:   Madison Rowe is a 71 y.o. female who presents for Medicare Annual (Subsequent) preventive examination.   Virtual Visit via Telephone Note  I connected with  SHAINE MOUNT on 05/09/22 at  3:30 PM EDT by telephone and verified that I am speaking with the correct person using two identifiers.  Location: Patient: home  Provider: [facility]  Persons participating in the virtual visit: patient/Nurse Health Advisor   I discussed the limitations, risks, security and privacy concerns of performing an evaluation and management service by telephone and the availability of in person appointments. The patient expressed understanding and agreed to proceed.  Interactive audio and video telecommunications were attempted between this nurse and patient, however failed, due to patient having technical difficulties OR patient did not have access to video capability.  We continued and completed visit with audio only.  Some vital signs may be absent or patient reported.   Daphane Shepherd, LPN  Review of Systems     Cardiac Risk Factors include: advanced age (>84mn, >>58women);dyslipidemia     Objective:    Today's Vitals   05/07/22 1540  Weight: 180 lb (81.6 kg)  Height: '5\' 4"'$  (1.626 m)   Body mass index is 30.9 kg/m.     05/07/2022    3:53 PM 05/05/2021    9:28 AM 01/30/2017    4:30 PM  Advanced Directives  Does Patient Have a Medical Advance Directive? Yes Yes Yes  Type of AParamedicof AClarence CenterLiving will Living will;Healthcare Power of APrince FrederickLiving will  Does patient want to make changes to medical advance directive?  No - Guardian declined   Copy of H[place] Chart? No - copy requested No - copy requested     Current Medications (verified) Outpatient Encounter Medications as of 05/07/2022  Medication Sig   aspirin 325 MG tablet as needed for mild pain or moderate pain.    atorvastatin (LIPITOR) 10 MG tablet TAKE 1 TABLET(10 MG) BY MOUTH DAILY   clobetasol cream (TEMOVATE) 0.05 % APPLY EXTERNALLY TO THE AFFECTED AREA TWICE DAILY (Patient taking differently: as needed (rash).)   conjugated estrogens (PREMARIN) vaginal cream Place vaginally as needed (dryness). 2-3 times weekly.   Dermatological Products, Misc. (DERMEND BRUISE FORMULA) LOTN as needed. bruising   Dietary Management Product (FOSTEUM PLUS) CAPS Take 1 capsule by mouth 2 (two) times daily.   Ferric Maltol (ACCRUFER) 30 MG CAPS Take 1 capsule by mouth daily.   fluticasone (FLONASE) 50 MCG/ACT nasal spray Place 2 sprays into both nostrils daily.   furosemide (LASIX) 20 MG tablet Take 1 tablet (20 mg total) by mouth daily.   naproxen sodium (ALEVE) 220 MG tablet as needed for pain.   Cholecalciferol 50 MCG (2000 UT) TABS Take 1 tablet (2,000 Units total) by mouth daily. (Patient not taking: Reported on 05/07/2022)   omeprazole (PRILOSEC) 20 MG capsule Take 20 mg by mouth as needed. To prevent upset stomach (Patient not taking: Reported on 05/07/2022)   [EXPIRED] triamcinolone acetonide (KENALOG) 10 MG/ML injection 10 mg    No facility-administered encounter medications on file as of 05/07/2022.    Allergies (verified) Alendronate sodium [alendronate sodium], Olmesartan, and Codeine   History: Past Medical History:  Diagnosis Date   Allergy    Anemia    Arthritis    Osteoporosis 2010   Vitamin D deficient osteomalacia 2010   Past Surgical History:  Procedure Laterality Date   BREAST  SURGERY     ECTOPIC PREGNANCY SURGERY     FOOT SURGERY Left    TONSILLECTOMY     Family History  Problem Relation Age of Onset   Kidney disease Father    Kidney disease Maternal Grandfather    Hypertension Mother    Cancer Neg Hx    Heart disease Neg Hx    Colon cancer Neg Hx    Esophageal cancer Neg Hx    Rectal cancer Neg Hx    Stomach cancer Neg Hx    Social History   Socioeconomic History   Marital  status: Married    Spouse name: Not on file   Number of children: Not on file   Years of education: Not on file   Highest education level: Not on file  Occupational History   Not on file  Tobacco Use   Smoking status: Never   Smokeless tobacco: Never  Substance and Sexual Activity   Alcohol use: Yes    Comment: Occasional   Drug use: No   Sexual activity: Yes    Birth control/protection: Surgical  Other Topics Concern   Not on file  Social History Narrative   Not on file   Social Determinants of Health   Financial Resource Strain: Low Risk  (05/07/2022)   Overall Financial Resource Strain (CARDIA)    Difficulty of Paying Living Expenses: Not hard at all  Food Insecurity: No Food Insecurity (05/07/2022)   Hunger Vital Sign    Worried About Running Out of Food in the Last Year: Never true    [place] in the Last Year: Never true  Transportation Needs: Unknown (05/07/2022)   PRAPARE - Transportation    Lack of Transportation (Medical): No    Lack of Transportation (Non-Medical): Not on file  Physical Activity: Sufficiently Active (05/07/2022)   Exercise Vital Sign    Days of Exercise per Week: 4 days    Minutes of Exercise per Session: 50 min  Stress: No Stress Concern Present (05/07/2022)   [place]    Feeling of Stress : Not at all  Social Connections: [place] (05/07/2022)   Social Connection and Isolation Panel [NHANES]    Frequency of Communication with Friends and Family: More than three times a week    Frequency of Social Gatherings with Friends and Family: More than three times a week    Attends Religious Services: More than 4 times per year    Active Member of [employer] or Organizations: Yes    Attends Music therapist: More than 4 times per year    Marital Status: Married    Tobacco Counseling Counseling given: Not Answered   Clinical Intake:  Pre-visit preparation  completed: Yes  Pain : No/denies pain     Diabetes: No  How often do you need to have someone help you when you read instructions, pamphlets, or other written materials from your doctor or pharmacy?: 1 - Never What is the last grade level you completed in school?: masters  Diabetic?no   Interpreter Needed?: No  Information entered by :: L.Wilson,LPN   Activities of Daily Living    05/07/2022    3:53 PM  In your present state of health, do you have any difficulty performing the following activities:  Hearing? 0  Vision? 0  Difficulty concentrating or making decisions? 0  Walking or climbing stairs? 0  Dressing or bathing? 0  Doing errands, shopping? 0  Preparing  Food and eating ? N  Using the Toilet? N  In the past six months, have you accidently leaked urine? N  Do you have problems with loss of bowel control? N  Managing your Medications? N  Managing your Finances? N  Housekeeping or managing your Housekeeping? N    Patient Care Team: Janith Lima, MD as PCP - General (Internal Medicine) Werner Lean, MD as PCP - Cardiology (Cardiology) Marica Otter, OD as Consulting Physician (Optometry)  Indicate any recent Medical Services you may have received from other than [facility] providers in the past year (date may be approximate).     Assessment:   This is a routine wellness examination for [place].  Hearing/Vision screen Vision Screening - Comments:: Annual eye exams wears contacts   Dietary issues and exercise activities discussed:     Goals Addressed               This Visit's Progress     Patient Stated (pt-stated)   On track     My goal is to lose more weight.  I have lost 15 pounds and I would like to reach a weight goal of 170 pounds.       Depression Screen    05/07/2022    4:04 PM 05/07/2022    3:51 PM 06/20/2021    4:33 PM 05/05/2021    9:46 AM 04/19/2020   10:35 AM 03/09/2019    3:12 PM 01/02/2018    5:40 PM  PHQ 2/9 Scores   PHQ - 2 Score 0 0 0 0 0 0 0    Fall Risk    05/07/2022    3:42 PM 06/20/2021    4:33 PM 05/05/2021    9:48 AM 04/19/2020   10:35 AM 03/09/2019    3:12 PM  [place] in the past year? 0 0 0 1 0  Number falls in past yr: 0 0 0 0 0  Injury with Fall? 0 0 0 0 0  Risk for fall due to : No Fall Risks  No Fall Risks History of fall(s) Impaired mobility  Follow up Falls prevention discussed Falls evaluation completed Falls evaluation completed Falls evaluation completed Falls evaluation completed    [place]:  Any stairs in or around the home? Yes  If so, are there any without handrails? No  Home free of loose throw rugs in walkways, pet beds, electrical cords, etc? Yes  Adequate lighting in your home to reduce risk of falls? Yes   ASSISTIVE DEVICES UTILIZED TO PREVENT FALLS:  Life alert? No  Use of a cane, walker or w/c? No  Grab bars in the bathroom? No  Shower chair or bench in shower? Yes  Elevated toilet seat or a handicapped toilet? No          05/07/2022    3:53 PM  6CIT Screen  What Year? 0 points  What month? 0 points  What time? 0 points  Count back from 20 0 points  Months in reverse 0 points  Repeat phrase 0 points  Total Score 0 points    Immunizations Immunization History  Administered Date(s) Administered   Fluad Quad(high Dose 65+) 06/16/2019   Influenza, High Dose Seasonal PF 07/11/2016, 06/17/2017, 07/19/2020   Influenza-Unspecified 06/21/2014, 07/30/2018, 07/25/2021   PFIZER(Purple Top)SARS-COV-2 Vaccination 09/30/2019, 10/22/2019, 06/15/2020, 01/23/2021   Pfizer Covid-19 Vaccine Bivalent Booster 90yr & up 07/25/2021   Pneumococcal Conjugate-13 07/11/2016   Pneumococcal Polysaccharide-23 07/11/2017  Tdap 09/20/2010, 03/07/2021   Zoster Recombinat (Shingrix) 01/21/2017, 07/11/2017   Zoster, Live 10/19/2009    TDAP status: Up to date  Flu Vaccine status: Up to date  Pneumococcal vaccine status: Up to  date  Covid-19 vaccine status: Completed vaccines  Qualifies for Shingles Vaccine? Yes   Zostavax completed Yes   Shingrix Completed?: Yes  Screening Tests Health Maintenance  Topic Date Due   COVID-19 Vaccine (6 - Pfizer risk series) 09/19/2021   COLONOSCOPY (Pts 45-22yr Insurance coverage will need to be confirmed)  02/12/2022   MAMMOGRAM  05/04/2022   INFLUENZA VACCINE  04/10/2022   TETANUS/TDAP  03/08/2031   Pneumonia Vaccine 71 Years old  Completed   DEXA SCAN  Completed   Hepatitis C Screening  Completed   Zoster Vaccines- Shingrix  Completed   HPV VACCINES  Aged Out    Health Maintenance  Health Maintenance Due  Topic Date Due   COVID-19 Vaccine (6 - Pfizer risk series) 09/19/2021   COLONOSCOPY (Pts 45-466yrInsurance coverage will need to be confirmed)  02/12/2022   MAMMOGRAM  05/04/2022   INFLUENZA VACCINE  04/10/2022    Colorectal cancer screening: Referral to GI placed 05/07/2022. Pt aware the office will call re: appt.  Mammogram status: Ordered Scheduled  05/22/2022 . Pt provided with contact info and advised to call to schedule appt.   Bone Density status: Ordered scheduled 05/22/2022. Pt provided with contact info and advised to call to schedule appt.  Lung Cancer Screening: (Low Dose CT Chest recommended if Age 73-80ears, 30 pack-year currently smoking OR have quit w/in 15years.) does not qualify.   Lung Cancer Screening Referral: n/a  Additional Screening:  Hepatitis C Screening: does not qualify; completed 03/09/2019  Vision Screening: Recommended annual ophthalmology exams for early detection of glaucoma and other disorders of the eye. Is the patient up to date with their annual eye exam?  Yes  Who is the provider or what is the name of the office in which the patient attends annual eye exams? Dr.Miller  If pt is not established with a provider, would they like to be referred to a provider to establish care? No .   Dental Screening:  Recommended annual dental exams for proper oral hygiene  Community Resource Referral / Chronic Care Management: CRR required this visit?  No   CCM required this visit?  No      Plan:     I have personally reviewed and noted the following in the patient's chart:   Medical and social history Use of alcohol, tobacco or illicit drugs  Current medications and supplements including opioid prescriptions. Patient is not currently taking opioid prescriptions. Functional ability and status Nutritional status Physical activity Advanced directives List of other physicians Hospitalizations, surgeries, and ER visits in previous 12 months Vitals Screenings to include cognitive, depression, and falls Referrals and appointments  In addition, I have reviewed and discussed with patient certain preventive protocols, quality metrics, and best practice recommendations. A written personalized care plan for preventive services as well as general preventive health recommendations were provided to patient.     LaDaphane ShepherdLPN   04/18/20/1943 Nurse Notes: Patient to call LeSantina Evanschedule Colonoscopy  , Referral 05/07/2022  Medical screening examination/treatment/procedure(s) were performed by non-physician practitioner and as supervising physician I was immediately available for consultation/collaboration.  I agree with above. AlLew DawesMD

## 2022-05-08 NOTE — Progress Notes (Signed)
Subjective:   Patient ID: Madison Rowe, female   DOB: 71 y.o.   MRN: [id number]   HPI Patient states she started develop pain again in the last couple weeks and stated she did do well for a long time but walked in [place] and was on her foot a lot and is also going on another trip   ROS      Objective:  Physical Exam  Neurovascular status intact with inflammation pain of the anterior tib and into the first metatarsal cuneiform joint left with inflammation around the surface     Assessment:  Plus ability for moderate anterior tibial tendinitis left versus possibility for arthritis of the metatarsocuneiform joint     Plan:  H&P reviewed condition and since its been 5 months and she did well but overworked it on vacation I did do 1 careful injection today after explaining chances for rupture of tendon.  I did sterile prep injected the sheath 3 mg Dexasone Kenalog 5 mg Xylocaine advised on reduced activity reappoint to recheck

## 2022-05-21 ENCOUNTER — Ambulatory Visit: Payer: Medicare Other | Admitting: Internal Medicine

## 2022-05-22 ENCOUNTER — Ambulatory Visit (HOSPITAL_COMMUNITY)
Admission: RE | Admit: 2022-05-22 | Discharge: 2022-05-22 | Disposition: A | Discharge status: Home / Self Care | Payer: Medicare PPO | Source: Ambulatory Visit | Attending: Internal Medicine | Admitting: Internal Medicine

## 2022-05-22 DIAGNOSIS — E782 Mixed hyperlipidemia: Secondary | ICD-10-CM | POA: Insufficient documentation

## 2022-05-22 LAB — HM DEXA SCAN

## 2022-05-22 LAB — HM MAMMOGRAPHY

## 2022-05-23 ENCOUNTER — Encounter: Payer: Self-pay | Admitting: Internal Medicine

## 2022-05-27 NOTE — Progress Notes (Signed)
Agree, low risk and per Fleischner criteria no additional imaging required.

## 2022-05-29 ENCOUNTER — Encounter: Payer: Self-pay | Admitting: Podiatry

## 2022-06-07 ENCOUNTER — Encounter: Payer: Self-pay | Admitting: Internal Medicine

## 2022-06-07 ENCOUNTER — Ambulatory Visit: Payer: Medicare PPO | Admitting: Internal Medicine

## 2022-06-07 VITALS — BP 118/74 | HR 76 | Temp 98.0°F | Ht 64.0 in | Wt 183.0 lb

## 2022-06-07 DIAGNOSIS — Z0000 Encounter for general adult medical examination without abnormal findings: Secondary | ICD-10-CM

## 2022-06-07 DIAGNOSIS — E785 Hyperlipidemia, unspecified: Secondary | ICD-10-CM | POA: Diagnosis not present

## 2022-06-07 DIAGNOSIS — Z0001 Encounter for general adult medical examination with abnormal findings: Secondary | ICD-10-CM

## 2022-06-07 DIAGNOSIS — E038 Other specified hypothyroidism: Secondary | ICD-10-CM

## 2022-06-07 DIAGNOSIS — E611 Iron deficiency: Secondary | ICD-10-CM

## 2022-06-07 DIAGNOSIS — I10 Essential (primary) hypertension: Secondary | ICD-10-CM | POA: Diagnosis not present

## 2022-06-07 LAB — HEPATIC FUNCTION PANEL
ALT: 17 U/L (ref 0–35)
AST: 17 U/L (ref 0–37)
Albumin: 4.2 g/dL (ref 3.5–5.2)
Alkaline Phosphatase: 57 U/L (ref 39–117)
Bilirubin, Direct: 0.1 mg/dL (ref 0.0–0.3)
Total Bilirubin: 0.5 mg/dL (ref 0.2–1.2)
Total Protein: 6.7 g/dL (ref 6.0–8.3)

## 2022-06-07 LAB — IBC + FERRITIN
Ferritin: 110.8 ng/mL (ref 10.0–291.0)
Iron: 129 ug/dL (ref 42–145)
Saturation Ratios: 38.2 % (ref 20.0–50.0)
TIBC: 337.4 ug/dL (ref 250.0–450.0)
Transferrin: 241 mg/dL (ref 212.0–360.0)

## 2022-06-07 LAB — CBC WITH DIFFERENTIAL/PLATELET
Basophils Absolute: 0 10*3/uL (ref 0.0–0.1)
Basophils Relative: 0.8 % (ref 0.0–3.0)
Eosinophils Absolute: 0.1 10*3/uL (ref 0.0–0.7)
Eosinophils Relative: 2.5 % (ref 0.0–5.0)
HCT: 38.7 % (ref 36.0–46.0)
Hemoglobin: 13.2 g/dL (ref 12.0–15.0)
Lymphocytes Relative: 31.3 % (ref 12.0–46.0)
Lymphs Abs: 1.7 10*3/uL (ref 0.7–4.0)
MCHC: 34 g/dL (ref 30.0–36.0)
MCV: 90.3 fl (ref 78.0–100.0)
Monocytes Absolute: 0.5 10*3/uL (ref 0.1–1.0)
Monocytes Relative: 9.8 % (ref 3.0–12.0)
Neutro Abs: 3 10*3/uL (ref 1.4–7.7)
Neutrophils Relative %: 55.6 % (ref 43.0–77.0)
Platelets: 188 10*3/uL (ref 150.0–400.0)
RBC: 4.29 Mil/uL (ref 3.87–5.11)
RDW: 13.8 % (ref 11.5–15.5)
WBC: 5.4 10*3/uL (ref 4.0–10.5)

## 2022-06-07 LAB — LIPID PANEL
Cholesterol: 161 mg/dL (ref 0–200)
HDL: 70.2 mg/dL (ref >39.00)
LDL Cholesterol: 80 mg/dL (ref 0–99)
NonHDL: 90.6
Total CHOL/HDL Ratio: 2
Triglycerides: 53 mg/dL (ref 0.0–149.0)
VLDL: 10.6 mg/dL (ref 0.0–40.0)

## 2022-06-07 LAB — TSH: TSH: 3 u[IU]/mL (ref 0.35–5.50)

## 2022-06-07 NOTE — Progress Notes (Signed)
Subjective:  Patient ID: Madison Rowe, female    DOB: [birth year]  Age: 71 y.o. MRN: [id number]  CC: Annual Exam, Anemia, Hyperlipidemia, Hypertension, and Hypothyroidism   HPI Madison Rowe presents for a CPX and f/up -  She is active and denies chest pain, shortness of breath, diaphoresis, or edema.  Outpatient Medications Prior to Visit  Medication Sig Dispense Refill   aspirin 325 MG tablet as needed for mild pain or moderate pain.     atorvastatin (LIPITOR) 10 MG tablet TAKE 1 TABLET(10 MG) BY MOUTH DAILY 90 tablet 1   Cholecalciferol 50 MCG (2000 UT) TABS Take 1 tablet (2,000 Units total) by mouth daily. 90 tablet 1   clobetasol cream (TEMOVATE) 0.05 % APPLY EXTERNALLY TO THE AFFECTED AREA TWICE DAILY (Patient taking differently: as needed (rash).) 60 g 1   conjugated estrogens (PREMARIN) vaginal cream Place vaginally as needed (dryness). 2-3 times weekly.     Dermatological Products, Misc. (DERMEND BRUISE FORMULA) LOTN as needed. bruising     Dietary Management Product (FOSTEUM PLUS) CAPS Take 1 capsule by mouth 2 (two) times daily. 180 capsule 1   fluticasone (FLONASE) 50 MCG/ACT nasal spray Place 2 sprays into both nostrils daily.     furosemide (LASIX) 20 MG tablet Take 1 tablet (20 mg total) by mouth daily. 30 tablet 11   omeprazole (PRILOSEC) 20 MG capsule Take 20 mg by mouth as needed. To prevent upset stomach     Ferric Maltol (ACCRUFER) 30 MG CAPS Take 1 capsule by mouth daily. 90 capsule 1   naproxen sodium (ALEVE) 220 MG tablet as needed for pain.     No facility-administered medications prior to visit.    ROS Review of Systems  Constitutional:  Negative for chills, diaphoresis, fatigue and fever.  HENT: Negative.    Eyes: Negative.   Respiratory:  Negative for cough, chest tightness and shortness of breath.   Cardiovascular:  Negative for chest pain, palpitations and leg swelling.  Gastrointestinal:  Negative for abdominal pain, diarrhea, nausea and  vomiting.  Endocrine: Negative.   Genitourinary: Negative.  Negative for difficulty urinating.  Musculoskeletal:  Positive for arthralgias. Negative for back pain and myalgias.  Skin: Negative.   Neurological:  Negative for dizziness, weakness, light-headedness and headaches.  Hematological:  Negative for adenopathy. Does not bruise/bleed easily.  Psychiatric/Behavioral: Negative.      Objective:  BP 118/74 (BP Location: Left Arm, Patient Position: Sitting, Cuff Size: Large)   Pulse 76   Temp 98 F (36.7 C) (Oral)   Ht '5\' 4"'$  (1.626 m)   Wt 183 lb (83 kg)   SpO2 94%   BMI 31.41 kg/m   BP Readings from Last 3 Encounters:  06/07/22 118/74  11/23/21 124/68  08/17/21 118/72    Wt Readings from Last 3 Encounters:  06/08/22 183 lb (83 kg)  06/07/22 183 lb (83 kg)  05/07/22 180 lb (81.6 kg)    Physical Exam Vitals reviewed.  HENT:     Mouth/Throat:     Mouth: Mucous membranes are moist.  Eyes:     General: No scleral icterus.    Conjunctiva/sclera: Conjunctivae normal.  Neck:     Thyroid: No thyroid mass, thyromegaly or thyroid tenderness.  Cardiovascular:     Rate and Rhythm: Normal rate and regular rhythm.     Heart sounds: No murmur heard. Pulmonary:     Effort: Pulmonary effort is normal.     Breath sounds: No stridor. No wheezing, rhonchi or rales.  Abdominal:     General: Abdomen is flat.     Palpations: There is no mass.     Tenderness: There is no abdominal tenderness. There is no guarding.     Hernia: No hernia is present.  Musculoskeletal:        General: Normal range of motion.     Cervical back: Normal range of motion and neck supple.     Right lower leg: No edema.     Left lower leg: No edema.  Lymphadenopathy:     Cervical: No cervical adenopathy.     Right cervical: No superficial, deep or posterior cervical adenopathy.    Left cervical: No superficial, deep or posterior cervical adenopathy.  Skin:    General: Skin is warm and dry.   Neurological:     General: No focal deficit present.     Mental Status: She is alert.  Psychiatric:        Mood and Affect: Mood normal.        Behavior: Behavior normal.     Lab Results  Component Value Date   WBC 5.4 06/07/2022   HGB 13.2 06/07/2022   HCT 38.7 06/07/2022   PLT 188.0 06/07/2022   GLUCOSE 92 11/23/2021   CHOL 161 06/07/2022   TRIG 53.0 06/07/2022   HDL 70.20 06/07/2022   LDLDIRECT 122.7 09/27/2011   LDLCALC 80 06/07/2022   ALT 17 06/07/2022   AST 17 06/07/2022   NA 141 11/23/2021   K 4.2 11/23/2021   CL 100 11/23/2021   CREATININE 0.71 11/23/2021   BUN 17 11/23/2021   CO2 27 11/23/2021   TSH 3.00 06/07/2022    CT CARDIAC SCORING (SELF PAY ONLY)  Addendum Date: 05/23/2022   ADDENDUM REPORT: 05/23/2022 09:38 ADDENDUM: The following report is an over-read performed by radiologist Dr. Dahlia Bailiff of [facility] on May 23, 2022. This over-read does not include interpretation of cardiac or coronary anatomy or pathology. The coronary calcium score interpretation by the cardiologist is attached. COMPARISON:  None. FINDINGS: Vascular: No acute non-cardiac vascular finding. Mediastinum/Nodes: Within the visualized portions of the chest there are no pathologically enlarged mediastinal, or hilar lymph nodes, noting limited sensitivity for the detection of hilar adenopathy on this noncontrast study. Small hiatal hernia. Lungs/Pleura: Clustered nodularity in the right-greater-than-left posterior upper lobes for instance on image 11/604 measuring up to 2 mm. Within the visualized portions of the thorax there is no pleural effusions and no pneumothorax Upper Abdomen: Visualized portions of the upper abdomen are unremarkable. Musculoskeletal: No acute osseous abnormality no acute osseous abnormality. IMPRESSION: 1. Clustered nodularity in the posterior right-greater-than-left lower lobes measuring up to 2 mm likely infectious or inflammatory. Per Fleischner  Society Guidelines, if patient is low risk for malignancy, no routine follow-up imaging is recommended; if patient is high risk for malignancy, a non-contrast Chest CT at 12 months is optional. If performed and the nodule is stable at 12 months, no further follow-up is recommended. These guidelines do not apply to immunocompromised patients and patients with cancer. Follow up in patients with significant comorbidities as clinically warranted. For lung cancer screening, adhere to Lung-RADS guidelines. Reference: Radiology. 2017; 284(1):228-43. 2. Small hiatal hernia. Electronically Signed   By: Dahlia Bailiff M.D.   On: 05/23/2022 09:38   Result Date: 05/23/2022 CLINICAL DATA:  Cardiovascular Disease Risk stratification EXAM: Coronary Calcium Score TECHNIQUE: A gated, non-contrast computed tomography scan of the heart was performed using 64m slice thickness. Axial images were analyzed on a  dedicated workstation. Calcium scoring of the coronary arteries was performed using the Agatston method. FINDINGS: Coronary Calcium Score: Left main: 0 Left anterior descending artery: 0 Left circumflex artery: 0 Right coronary artery: 0 Total: 0 Percentile: 0 Pericardium: Normal. Ascending Aorta: Normal caliber. Non-cardiac: See separate report from [facility]. IMPRESSION: Coronary calcium score of 0. This was 0 percentile for age-, race-, and sex-matched controls. RECOMMENDATIONS: Coronary artery calcium (CAC) score is a strong predictor of incident coronary heart disease (CHD) and provides predictive information beyond traditional risk factors. CAC scoring is reasonable to use in the decision to withhold, postpone, or initiate statin therapy in intermediate-risk or selected borderline-risk asymptomatic adults (age 59-75 years and LDL-C >=70 to <190 mg/dL) who do not have diabetes or established atherosclerotic cardiovascular disease (ASCVD).* In intermediate-risk (10-year ASCVD risk >=7.5% to <20%) adults or selected  borderline-risk (10-year ASCVD risk >=5% to <7.5%) adults in whom a CAC score is measured for the purpose of making a treatment decision the following recommendations have been made: If CAC=0, it is reasonable to withhold statin therapy and reassess in 5 to 10 years, as long as higher risk conditions are absent (diabetes mellitus, family history of premature CHD in first degree relatives (males <55 years; females <65 years), cigarette smoking, or LDL >=190 mg/dL). If CAC is 1 to 99, it is reasonable to initiate statin therapy for patients >=26 years of age. If CAC is >=100 or >=75th percentile, it is reasonable to initiate statin therapy at any age. Cardiology referral should be considered for patients with CAC scores >=400 or >=75th percentile. *2018 AHA/ACC/AACVPR/AAPA/ABC/ACPM/ADA/AGS/APhA/ASPC/NLA/PCNA Guideline on the Management of Blood Cholesterol: A Report of the American College of Cardiology/American Heart Association Task Force on Clinical Practice Guidelines. J Am Coll Cardiol. 2019;73(24):3168-3209. Kirk Ruths, MD Electronically Signed: By: Kirk Ruths M.D. On: 05/22/2022 17:17    Assessment & Plan:   Madison Rowe was seen today for annual exam, anemia, hyperlipidemia, hypertension and hypothyroidism.  Diagnoses and all orders for this visit:  Dyslipidemia, goal LDL below 130- LDL goal achieved. Doing well on the statin  -     Lipid panel; Future -     TSH; Future -     Hepatic function panel; Future -     Hepatic function panel -     TSH -     Lipid panel  Other specified hypothyroidism- She is euthyroid. -     TSH; Future -     TSH  Iron deficiency- Her H&H are normal now and her iron level is normal.  Will discontinue the iron supplement. -     IBC + Ferritin; Future -     CBC with Differential/Platelet; Future -     CBC with Differential/Platelet -     IBC + Ferritin  Essential hypertension- Her blood pressure is well controlled. -     TSH; Future -     Hepatic  function panel; Future -     Hepatic function panel -     TSH  Encounter for general adult medical examination with abnormal findings- Exam completed, labs reviewed, vaccines reviewed and updated, cancer screenings are up-to-date, patient education was given.   I have discontinued [place] and naproxen sodium. I am also having her maintain her conjugated estrogens, Cholecalciferol, clobetasol cream, fluticasone, omeprazole, furosemide, atorvastatin, Fosteum Plus, aspirin, and DerMend Bruise Formula.  No orders of the defined types were placed in this encounter.    Follow-up: Return in about 6 months (around  12/06/2022).  Madison Calico, MD

## 2022-06-07 NOTE — Patient Instructions (Signed)

## 2022-06-08 ENCOUNTER — Ambulatory Visit (AMBULATORY_SURGERY_CENTER): Payer: Self-pay

## 2022-06-08 VITALS — Ht 63.0 in | Wt 183.0 lb

## 2022-06-08 DIAGNOSIS — Z86010 Personal history of colonic polyps: Secondary | ICD-10-CM

## 2022-06-08 MED ORDER — NA SULFATE-K SULFATE-MG SULF 17.5-3.13-1.6 GM/177ML PO SOLN: 1.0000 | Freq: Once | ORAL | 0 refills | Status: AC

## 2022-06-08 NOTE — Progress Notes (Signed)
No egg or soy allergy known to patient  No issues known to pt with past sedation with any surgeries or procedures Patient denies ever being told they had issues or difficulty with intubation  No FH of Malignant Hyperthermia Pt is not on diet pills Pt is not on  home 02  Pt is not on blood thinners  Pt denies issues with constipation  No A fib or A flutter Have any cardiac testing pending--no Pt instructed to use [email or web address] or GoodRx for a price reduction on prep   

## 2022-06-18 ENCOUNTER — Telehealth: Payer: Medicare PPO | Admitting: Physician Assistant

## 2022-06-18 DIAGNOSIS — J208 Acute bronchitis due to other specified organisms: Secondary | ICD-10-CM

## 2022-06-19 ENCOUNTER — Encounter: Payer: Self-pay | Admitting: Family Medicine

## 2022-06-19 MED ORDER — PREDNISONE 10 MG (21) PO TBPK: ORAL_TABLET | ORAL | 0 refills | Status: DC

## 2022-06-19 MED ORDER — DOXYCYCLINE HYCLATE 100 MG PO TABS: 100.0000 mg | ORAL_TABLET | Freq: Two times a day (BID) | ORAL | 0 refills | Status: DC

## 2022-06-19 MED ORDER — BENZONATATE 100 MG PO CAPS: 100.0000 mg | ORAL_CAPSULE | Freq: Three times a day (TID) | ORAL | 0 refills | Status: DC | PRN

## 2022-06-19 NOTE — Progress Notes (Signed)
I have spent 5 minutes in review of e-visit questionnaire, review and updating patient chart, medical decision making and response to patient.   Twala Collings Cody Demani Weyrauch, PA-C    

## 2022-06-19 NOTE — Progress Notes (Signed)
We are sorry that you are not feeling well.  Here is how we plan to help!  Based on your presentation I believe you most likely have A cough due to a virus.  This is called viral bronchitis and is best treated by rest, plenty of fluids and control of the cough.  You may use Ibuprofen or Tylenol as directed to help your symptoms.     In addition you may use A prescription cough medication called Tessalon Perles '100mg'$ . You may take 1-2 capsules every 8 hours as needed for your cough.  I have also sent in a short course of a steroid to reduce inflammation and help to open airways to calm coughing.   From your responses in the eVisit questionnaire you describe inflammation in the upper respiratory tract which is causing a significant cough.  This is commonly called Bronchitis and has four common causes:   Allergies Viral Infections Acid Reflux Bacterial Infection Allergies, viruses and acid reflux are treated by controlling symptoms or eliminating the cause. An example might be a cough caused by taking certain blood pressure medications. You stop the cough by changing the medication. Another example might be a cough caused by acid reflux. Controlling the reflux helps control the cough.  USE OF BRONCHODILATOR ("RESCUE") INHALERS: There is a risk from using your bronchodilator too frequently.  The risk is that over-reliance on a medication which only relaxes the muscles surrounding the breathing tubes can reduce the effectiveness of medications prescribed to reduce swelling and congestion of the tubes themselves.  Although you feel brief relief from the bronchodilator inhaler, your asthma may actually be worsening with the tubes becoming more swollen and filled with mucus.  This can delay other crucial treatments, such as oral steroid medications. If you need to use a bronchodilator inhaler daily, several times per day, you should discuss this with your provider.  There are probably better treatments that  could be used to keep your asthma under control.     HOME CARE Only take medications as instructed by your medical team. Complete the entire course of an antibiotic. Drink plenty of fluids and get plenty of rest. Avoid close contacts especially the very young and the elderly Cover your mouth if you cough or cough into your sleeve. Always remember to wash your hands A steam or ultrasonic humidifier can help congestion.   GET HELP RIGHT AWAY IF: You develop worsening fever. You become short of breath You cough up blood. Your symptoms persist after you have completed your treatment plan MAKE SURE YOU  Understand these instructions. Will watch your condition. Will get help right away if you are not doing well or get worse.    Thank you for choosing an e-visit.  Your e-visit answers were reviewed by a board certified advanced clinical practitioner to complete your personal care plan. Depending upon the condition, your plan could have included both over the counter or prescription medications.  Please review your pharmacy choice. Make sure the pharmacy is open so you can pick up prescription now. If there is a problem, you may contact your provider through [employer] and have the prescription routed to another pharmacy.  Your safety is important to [place]. If you have drug allergies check your prescription carefully.   For the next 24 hours you can use MyChart to ask questions about today's visit, request a non-urgent call back, or ask for a work or school excuse. You will get an email in the next two  days asking about your experience. I hope that your e-visit has been valuable and will speed your recovery.

## 2022-06-19 NOTE — Addendum Note (Signed)
Addended by: Brunetta Jeans on: 06/19/2022 10:11 AM   Modules accepted: Orders

## 2022-06-20 ENCOUNTER — Ambulatory Visit: Payer: Medicare PPO | Admitting: Family Medicine

## 2022-06-20 ENCOUNTER — Encounter: Payer: Self-pay | Admitting: Family Medicine

## 2022-06-20 VITALS — BP 122/78 | HR 100 | Temp 97.6°F | Ht 63.0 in | Wt 186.0 lb

## 2022-06-20 DIAGNOSIS — R058 Other specified cough: Secondary | ICD-10-CM

## 2022-06-20 DIAGNOSIS — J029 Acute pharyngitis, unspecified: Secondary | ICD-10-CM

## 2022-06-20 DIAGNOSIS — J040 Acute laryngitis: Secondary | ICD-10-CM | POA: Diagnosis not present

## 2022-06-20 NOTE — Progress Notes (Signed)
Subjective: Chief Complaint  Patient presents with   Nasal Congestion    Runny nose accompanied by post nasal drip.  Started prednisone, tessalon pearls and doxycycline medication yesterday.   Cough    Productive cough with green sputum started sunday   Sore Throat    Thinks she has laryngitis, loss of voice and sore throat has been going on since thursday  Negative COVID test Friday and Negative     Madison Rowe is a 71 y.o. female who presents for a 7 day hx of sore throat, rhinorrhea, laryngitis, cough with green sputum.  States she did an E-visit and was prescribed medications including antibiotics, oral steroids and Tessalon.  Reports some improvement in symptoms and has not started the steroids. Started Doxycycline yesterday. Using [employer]  Sudafed and Mucinex. Drinking plenty of water and doing salt water gargles.   Denies fever, chills, body aches, dizziness, chest pain, palpitations, shortness of breath, N/V/D.   Treatment to date:  started Doxycycline yesterday. Sufafed and Mucinex.  .  Denies sick contacts.  No other aggravating or relieving factors.      ROS as in subjective.   Objective: Vitals:   06/20/22 0828  BP: 122/78  Pulse: 100  Temp: 97.6 F (36.4 C)  SpO2: 96%    General appearance: Alert, WD/WN, no distress, mildly ill appearing                             Skin: warm, no rash                           Head: no sinus tenderness                            Eyes: conjunctiva normal, corneas clear, PERRLA                            Ears: pearly TMs, external ear canals normal                          Nose: septum midline, turbinates swollen, with erythema and clear discharge             Mouth/throat: MMM, tongue normal, mild pharyngeal erythema                           Neck: supple, no adenopathy, no thyromegaly, nontender                          Heart: RRR                         Lungs: CTA bilaterally, no wheezes, rales, or rhonchi       Assessment: Productive cough  Laryngitis  Acute pharyngitis, unspecified etiology   Plan: Reviewed notes from ED visit.  She was prescribed doxycycline which she started yesterday.  I recommend she complete the course.  She will hold off on prednisone.  Take Tessalon as prescribed.  Suggested symptomatic OTC remedies.  Call/return if worsening or not back to baseline when she completes the antibiotics.Marland Kitchen

## 2022-06-20 NOTE — Patient Instructions (Signed)
Complete the antibiotic. You may hold off on the steroids.  Stay well-hydrated.  Continue salt water gargles.  Also recommend warm fluids such as tea with honey and lemon.  You may continue taking over-the-counter Sudafed and Mucinex.  Take the [facility] as needed.  Follow-up if you are getting worse or if you are not back to baseline when you complete the antibiotics.

## 2022-06-29 ENCOUNTER — Encounter: Payer: Self-pay | Admitting: Internal Medicine

## 2022-07-09 ENCOUNTER — Encounter: Payer: Self-pay | Admitting: Internal Medicine

## 2022-07-09 ENCOUNTER — Ambulatory Visit (AMBULATORY_SURGERY_CENTER): Payer: Medicare PPO | Admitting: Internal Medicine

## 2022-07-09 VITALS — BP 118/63 | HR 68 | Temp 96.6°F | Resp 12 | Ht 64.0 in | Wt 183.0 lb

## 2022-07-09 DIAGNOSIS — Z86010 Personal history of colonic polyps: Secondary | ICD-10-CM | POA: Diagnosis not present

## 2022-07-09 DIAGNOSIS — Z09 Encounter for follow-up examination after completed treatment for conditions other than malignant neoplasm: Secondary | ICD-10-CM | POA: Diagnosis not present

## 2022-07-09 HISTORY — PX: COLONOSCOPY: SHX174

## 2022-07-09 MED ORDER — SODIUM CHLORIDE 0.9 % IV SOLN: 500.0000 mL | Freq: Once | INTRAVENOUS | Status: DC

## 2022-07-09 NOTE — Patient Instructions (Signed)

## 2022-07-09 NOTE — Progress Notes (Signed)
Pt's states no medical or surgical changes since previsit or office visit. 

## 2022-07-09 NOTE — Progress Notes (Signed)
Sedate, gd SR, tolerated procedure well, VSS, report to RN 

## 2022-07-09 NOTE — Op Note (Signed)
[facility] Patient Name: Madison Rowe Procedure Date: 07/09/2022 9:38 AM MRN: [id number] Endoscopist: Docia Chuck. Henrene Pastor , MD, [id number] Age: 71 Referring MD:  Date of Birth: [birth year] Gender: Female Account #: [id number] Procedure:                Colonoscopy Indications:              High risk colon cancer surveillance: Personal                            history of non-advanced adenoma. Previous                            examinations 2008, 2018 Medicines:                Monitored Anesthesia Care Procedure:                Pre-Anesthesia Assessment:                           - Prior to the procedure, a History and Physical                            was performed, and patient medications and                            allergies were reviewed. The patient's tolerance of                            previous anesthesia was also reviewed. The risks                            and benefits of the procedure and the sedation                            options and risks were discussed with the patient.                            All questions were answered, and informed consent                            was obtained. Prior Anticoagulants: The patient has                            taken no anticoagulant or antiplatelet agents. ASA                            Grade Assessment: II - A patient with mild systemic                            disease. After reviewing the risks and benefits,                            the patient was deemed in satisfactory condition to  undergo the procedure.                           After obtaining informed consent, the colonoscope                            was passed under direct vision. Throughout the                            procedure, the patient's blood pressure, pulse, and                            oxygen saturations were monitored continuously. The                            CF [id number] #[id number] was introduced through the  anus                            and advanced to the the cecum, identified by                            appendiceal orifice and ileocecal valve. The                            ileocecal valve, appendiceal orifice, and rectum                            were photographed. The quality of the bowel                            preparation was excellent. The colonoscopy was                            performed without difficulty. The patient tolerated                            the procedure well. The bowel preparation used was                            SUPREP via split dose instruction. Scope In: 9:50:25 AM Scope Out: 9:59:57 AM Scope Withdrawal Time: 0 hours 7 minutes 32 seconds  Total Procedure Duration: 0 hours 9 minutes 32 seconds  Findings:                 Multiple diverticula were found in the sigmoid                            colon.                           Internal hemorrhoids were found during                            retroflexion. The hemorrhoids were small.  The exam was otherwise without abnormality on                            direct and retroflexion views. Complications:            No immediate complications. Estimated blood loss:                            None. Estimated Blood Loss:     Estimated blood loss: none. Impression:               - Diverticulosis in the sigmoid colon.                           - Internal hemorrhoids.                           - The examination was otherwise normal on direct                            and retroflexion views.                           - No specimens collected. Recommendation:           - Repeat colonoscopy is not recommended for                            surveillance.                           - Patient has a contact number available for                            emergencies. The signs and symptoms of potential                            delayed complications were discussed with the                             patient. Return to normal activities tomorrow.                            Written discharge instructions were provided to the                            patient.                           - Resume previous diet.                           - Continue present medications. Docia Chuck. Henrene Pastor, MD 07/09/2022 10:04:02 AM This report has been signed electronically.

## 2022-07-09 NOTE — Progress Notes (Signed)
HISTORY OF PRESENT ILLNESS:  Madison Rowe is a 71 y.o. female with a history of adenomatous colon polyp on previous colonoscopy.  Now for surveillance  REVIEW OF SYSTEMS:  All non-GI ROS negative. Past Medical History:  Diagnosis Date   Allergy    Anemia    Arthritis    Osteoporosis 2010   Vitamin D deficient osteomalacia 2010    Past Surgical History:  Procedure Laterality Date   BREAST SURGERY     ECTOPIC PREGNANCY SURGERY     FOOT SURGERY Left    laproscopy  1982   TONSILLECTOMY      Social History Madison Rowe  reports that she has never smoked. She has never used smokeless tobacco. She reports current alcohol use. She reports that she does not use drugs.  family history includes Hypertension in her mother; Kidney disease in her father and maternal grandfather.  Allergies  Allergen Reactions   Alendronate Sodium [Alendronate Sodium]     nausea   Olmesartan Other (See Comments)    quizzy   Codeine Nausea And Vomiting       PHYSICAL EXAMINATION: Vital signs: There were no vitals taken for this visit. General: Well-developed, well-nourished, no acute distress HEENT: Sclerae are anicteric, conjunctiva pink. Oral mucosa intact Lungs: Clear Heart: Regular Abdomen: soft, nontender, nondistended, no obvious ascites, no peritoneal signs, normal bowel sounds. No organomegaly. Extremities: No edema Psychiatric: alert and oriented x3. Cooperative      ASSESSMENT:  History of adenomatous colon polyp   PLAN:  Surveillance colonoscopy

## 2022-07-10 ENCOUNTER — Telehealth: Payer: Self-pay | Admitting: *Deleted

## 2022-07-10 ENCOUNTER — Encounter: Payer: Self-pay | Admitting: Internal Medicine

## 2022-07-10 NOTE — Telephone Encounter (Signed)
  Follow up Call-     07/09/2022    9:09 AM  Call back number  Post procedure Call Back phone  # [phone]  Permission to leave phone message Yes     Patient questions:  Message left to call us if necessary.

## 2022-07-13 ENCOUNTER — Ambulatory Visit (INDEPENDENT_AMBULATORY_CARE_PROVIDER_SITE_OTHER): Payer: Medicare PPO | Admitting: Internal Medicine

## 2022-07-13 ENCOUNTER — Encounter: Payer: Self-pay | Admitting: Internal Medicine

## 2022-07-13 VITALS — BP 112/78 | HR 84 | Temp 98.3°F | Ht 64.0 in

## 2022-07-13 DIAGNOSIS — J0120 Acute ethmoidal sinusitis, unspecified: Secondary | ICD-10-CM

## 2022-07-13 DIAGNOSIS — Z23 Encounter for immunization: Secondary | ICD-10-CM | POA: Diagnosis not present

## 2022-07-13 MED ORDER — AMOXICILLIN-POT CLAVULANATE 875-125 MG PO TABS: 1.0000 | ORAL_TABLET | Freq: Two times a day (BID) | ORAL | 0 refills | Status: DC

## 2022-07-13 NOTE — Addendum Note (Signed)
Addended by: Marcina Millard on: 07/13/2022 04:30 PM   Modules accepted: Orders

## 2022-07-13 NOTE — Progress Notes (Signed)
Subjective:    Patient ID: Madison Rowe, female    DOB: 02-13-1951, 71 y.o.   MRN: [id number]      HPI Madison Rowe is here for  Chief Complaint  Patient presents with   Nasal Congestion    10/11 - saw vickie for URI symptoms  - doxycycline, few doses of prednisone and tessalon perles.  She is still symptomatic.   Symptoms now - PND, hoarseness - worse at night, runny nose, cough - sometimes bringing up mucus, sinus pressure, fatigue.    She is taking claritin, sudafed, mucinex, tylenol.    Medications and allergies reviewed with patient and updated if appropriate.  Current Outpatient Medications on File Prior to Visit  Medication Sig Dispense Refill   atorvastatin (LIPITOR) 10 MG tablet TAKE 1 TABLET(10 MG) BY MOUTH DAILY 90 tablet 1   Cholecalciferol 50 MCG (2000 UT) TABS Take 1 tablet (2,000 Units total) by mouth daily. 90 tablet 1   clobetasol cream (TEMOVATE) 0.05 % APPLY EXTERNALLY TO THE AFFECTED AREA TWICE DAILY (Patient taking differently: as needed (rash).) 60 g 1   conjugated estrogens (PREMARIN) vaginal cream Place vaginally as needed (dryness). 2-3 times weekly.     Dermatological Products, Misc. (DERMEND BRUISE FORMULA) LOTN as needed. bruising     Dietary Management Product (FOSTEUM PLUS) CAPS Take 1 capsule by mouth 2 (two) times daily. 180 capsule 1   FERRIC MALTOL PO Take by mouth.     fluticasone (FLONASE) 50 MCG/ACT nasal spray Place 2 sprays into both nostrils daily.     furosemide (LASIX) 20 MG tablet Take 1 tablet (20 mg total) by mouth daily. 30 tablet 11   guaifenesin (HUMIBID E) 400 MG TABS tablet Take 400 mg by mouth every 4 (four) hours.     loratadine (CLARITIN) 10 MG tablet Take 10 mg by mouth daily.     pseudoephedrine (SUDAFED) 30 MG tablet Take 30 mg by mouth every 4 (four) hours as needed for congestion.     No current facility-administered medications on file prior to visit.    Review of Systems  Constitutional:  Positive for fatigue.  Negative for chills and fever.  HENT:  Positive for postnasal drip, rhinorrhea, sinus pressure (at times - eyebrow area) and voice change. Negative for congestion, ear pain (fluid in ear feeling) and sore throat.   Respiratory:  Positive for cough (more from PND, occ spits out phlegm). Negative for shortness of breath and wheezing.   Musculoskeletal:  Positive for myalgias (sometimes).  Neurological:  Negative for dizziness and headaches.       Objective:   Vitals:   07/13/22 1325  BP: 112/78  Pulse: 84  Temp: 98.3 F (36.8 C)  SpO2: 94%   BP Readings from Last 3 Encounters:  07/13/22 112/78  07/09/22 118/63  06/20/22 122/78   Wt Readings from Last 3 Encounters:  07/09/22 183 lb (83 kg)  06/20/22 186 lb (84.4 kg)  06/08/22 183 lb (83 kg)   Body mass index is 31.41 kg/m.    Physical Exam Constitutional:      General: She is not in acute distress.    Appearance: Normal appearance. She is not ill-appearing.  HENT:     Head: Normocephalic and atraumatic.     Right Ear: Tympanic membrane, ear canal and external ear normal.     Left Ear: Tympanic membrane, ear canal and external ear normal.     Mouth/Throat:     Mouth: Mucous membranes are moist.  Pharynx: No oropharyngeal exudate or posterior oropharyngeal erythema.  Eyes:     Conjunctiva/sclera: Conjunctivae normal.  Cardiovascular:     Rate and Rhythm: Normal rate and regular rhythm.  Pulmonary:     Effort: Pulmonary effort is normal. No respiratory distress.     Breath sounds: Normal breath sounds. No wheezing or rales.  Musculoskeletal:     Cervical back: Neck supple. No tenderness.  Lymphadenopathy:     Cervical: No cervical adenopathy.  Skin:    General: Skin is warm and dry.  Neurological:     Mental Status: She is alert.            Assessment & Plan:   Acute sinus infection: Acute Likely partially treated Augmentin bid x 10 days Continue otc meds call if there is no improvement in  symptoms.     Afebrile so will give flu vac

## 2022-07-13 NOTE — Patient Instructions (Addendum)
     Flu immunization administered today.      Medications changes include :   Augmentin twice daily       Return if symptoms worsen or fail to improve.

## 2022-07-26 ENCOUNTER — Ambulatory Visit (INDEPENDENT_AMBULATORY_CARE_PROVIDER_SITE_OTHER): Payer: Medicare PPO

## 2022-07-26 ENCOUNTER — Encounter: Payer: Self-pay | Admitting: Podiatry

## 2022-07-26 ENCOUNTER — Ambulatory Visit: Payer: Medicare PPO | Admitting: Podiatry

## 2022-07-26 DIAGNOSIS — M76812 Anterior tibial syndrome, left leg: Secondary | ICD-10-CM

## 2022-07-26 DIAGNOSIS — M722 Plantar fascial fibromatosis: Secondary | ICD-10-CM | POA: Diagnosis not present

## 2022-07-26 MED ORDER — TRIAMCINOLONE ACETONIDE 10 MG/ML IJ SUSP
10.0000 mg | Freq: Once | INTRAMUSCULAR | Status: AC
  Administered 2022-07-26: 10 mg

## 2022-07-27 NOTE — Progress Notes (Signed)
Subjective:   Patient ID: Madison Rowe, female   DOB: 71 y.o.   MRN: [id number]   HPI Patient presents stating the pain seems to be in a different place but still present and I have not been using my splint   ROS      Objective:  Physical Exam  Neurovascular status intact with inflammation that is more in the plantar fascia in the mid arch area left distal of the anterior tibial insertion left with no current loss of motion of the first MPJ     Assessment:  Fasciitis symptomatology left with what appears to be improvement anterior tibial tendinitis     Plan:  Reviewed night splint usage along with aggressive ice and did inject the fascial band distal to the insertion anterior to and before the MPJ 3 mg dexamethasone Kenalog 5 mg Xylocaine applied sterile dressing reappoint to recheck may require complete immobilization

## 2022-08-01 ENCOUNTER — Telehealth: Payer: Self-pay | Admitting: Internal Medicine

## 2022-08-01 ENCOUNTER — Telehealth: Payer: Self-pay | Admitting: Pulmonary Disease

## 2022-08-01 DIAGNOSIS — I272 Pulmonary hypertension, unspecified: Secondary | ICD-10-CM

## 2022-08-01 DIAGNOSIS — R0609 Other forms of dyspnea: Secondary | ICD-10-CM

## 2022-08-01 NOTE — Telephone Encounter (Signed)
Fax received from pt's pharmacy for a refill of pt's furosemide '20mg'$  tablet. Dr. Silas Flood, please advise if you are okay with [place] refilling this or if you prefer this to be handled by either pt's PCP or cardiologist.

## 2022-08-01 NOTE — Telephone Encounter (Signed)
Patient needs her fosteum plus refilled.  Please send to [facility] - home delivery -

## 2022-08-06 MED ORDER — FUROSEMIDE 20 MG PO TABS: 20.0000 mg | ORAL_TABLET | Freq: Every day | ORAL | 2 refills | Status: DC

## 2022-08-06 NOTE — Telephone Encounter (Signed)
ATC Left detailed message for pt to make an appt because she is overdue for an ov. Refilled her Furosemide 20 MG with 2 refills. Nothing further needed at this time.

## 2022-08-06 NOTE — Telephone Encounter (Signed)
Ok refilling

## 2022-08-09 ENCOUNTER — Other Ambulatory Visit: Payer: Self-pay

## 2022-08-09 DIAGNOSIS — M810 Age-related osteoporosis without current pathological fracture: Secondary | ICD-10-CM

## 2022-08-09 MED ORDER — FOSTEUM PLUS PO CAPS: 1.0000 | ORAL_CAPSULE | Freq: Two times a day (BID) | ORAL | 1 refills | Status: DC

## 2022-08-14 IMAGING — US US FNA BIOPSY THYROID 1ST LESION
1 series · 10 of 10 positions shown · non-contrast
Comparison: US Thyroid 04/20/20

MEDICATIONS:
1% lidocaine 5 mL

COMPLICATIONS:
None immediate.

INDICATION: Indeterminate left mid thyroid nodule

EXAM:
ULTRASOUND GUIDED FINE NEEDLE ASPIRATION OF INDETERMINATE THYROID
NODULE
TECHNIQUE: Informed written consent was obtained from the patient after a
discussion of the risks, benefits and alternatives to treatment.
Questions regarding the procedure were encouraged and answered. A
timeout was performed prior to the initiation of the procedure.

[Series 1: us fna biopsy thyroid 1st lesion · 0.06mm/px · 10 acquisitions, 10 frames shown]
[im 1/10]
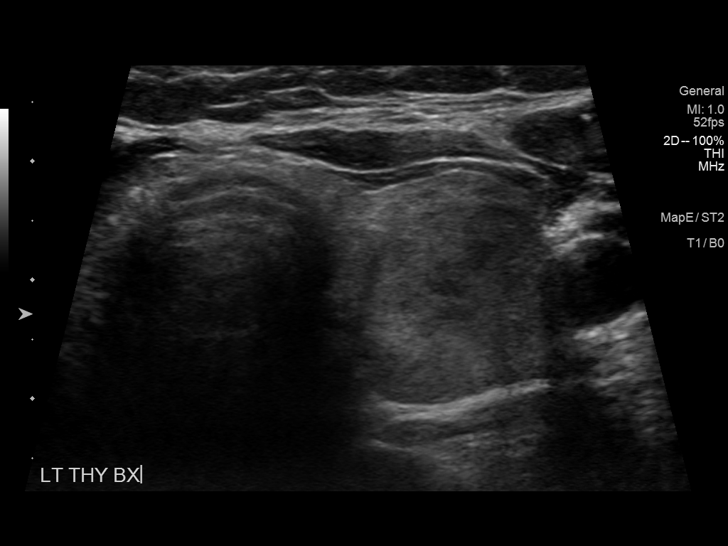
[im 2/10]
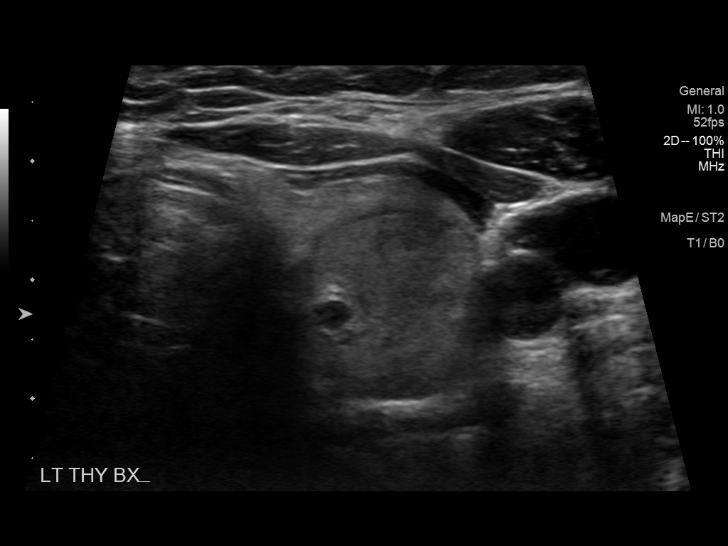
[im 3/10]
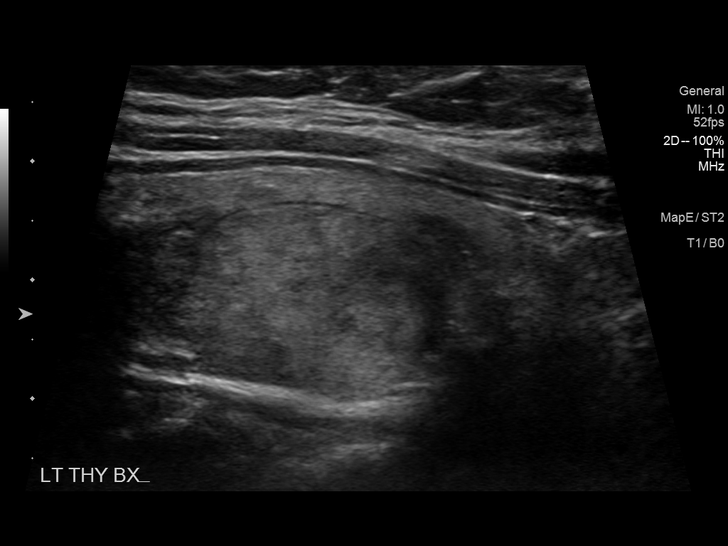
[im 4/10]
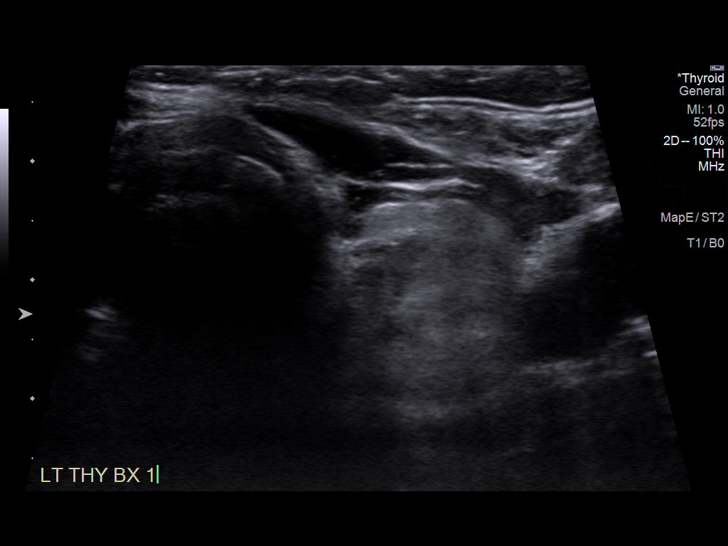
[im 5/10]
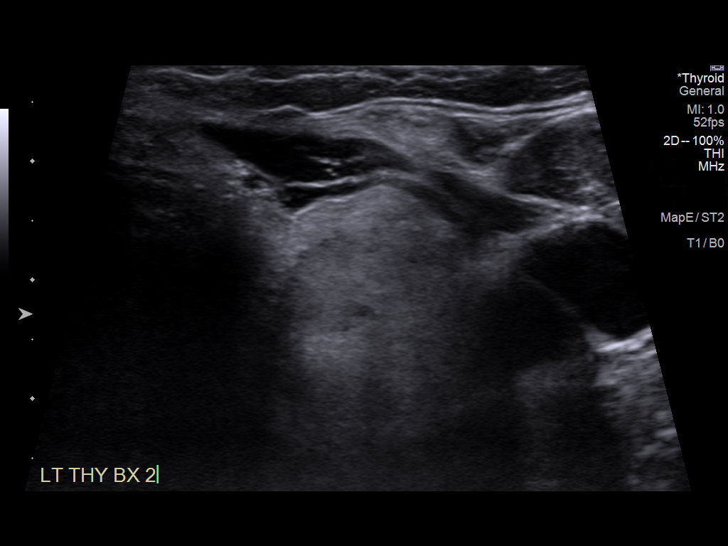
[im 6/10]
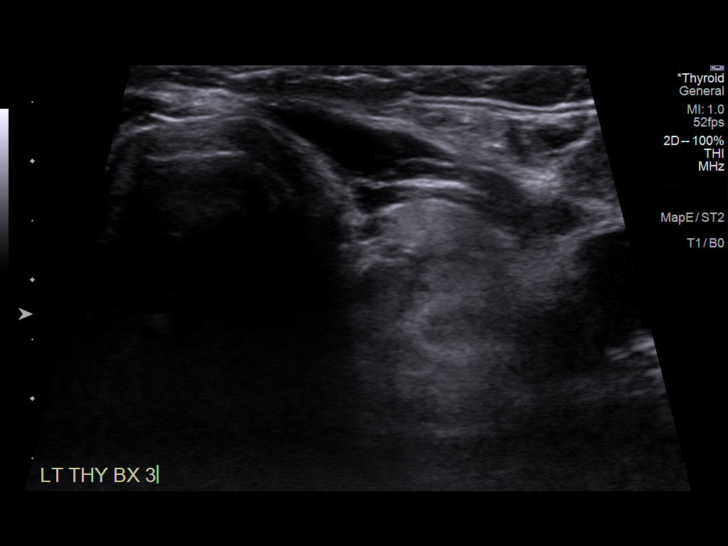
[im 7/10]
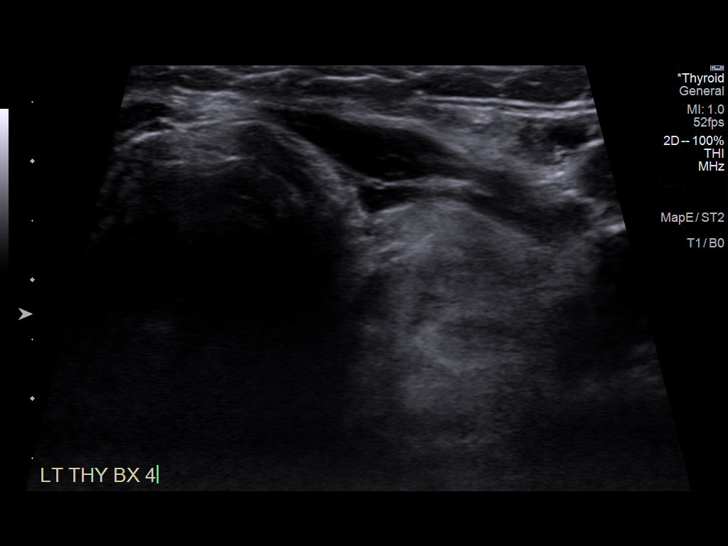
[im 8/10]
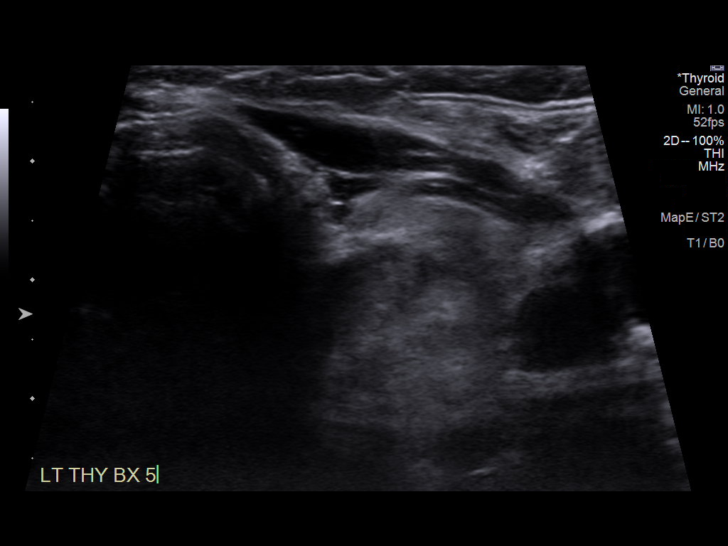
[im 9/10]
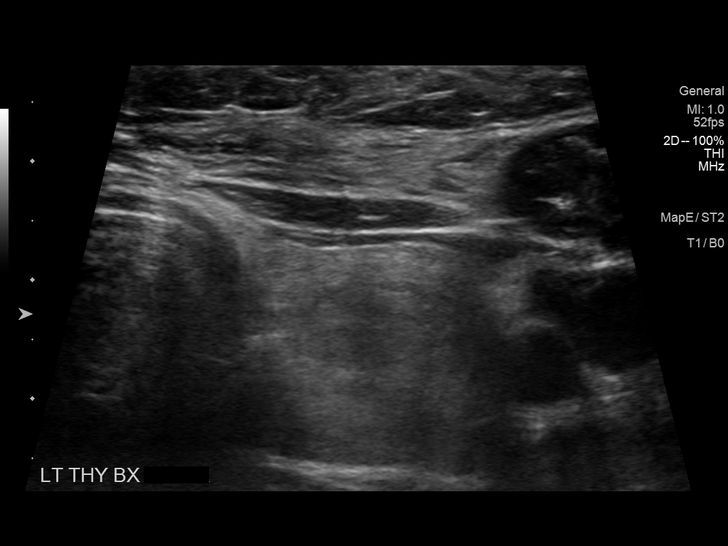
[im 10/10]
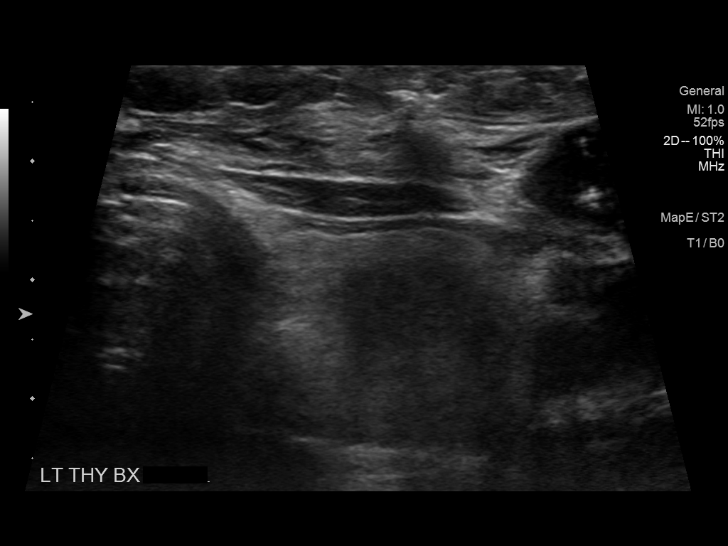

[10 of 10 positions shown; findings below may reference images not displayed]

Pre-procedural ultrasound scanning demonstrated unchanged size and
appearance of the indeterminate nodule within the left mid thyroid

The procedure was planned. The neck was prepped in the usual sterile
fashion, and a sterile drape was applied covering the operative
field. A timeout was performed prior to the initiation of the
procedure. Local anesthesia was provided with 1% lidocaine.

Under direct ultrasound guidance, 5 FNA biopsies were performed of
the left mid thyroid nodule with a 25 gauge needle. Multiple
ultrasound images were saved for procedural documentation purposes.
The samples were prepared and submitted to pathology. Two of these
samples were prepared for Afirma testing.

Limited post procedural scanning was negative for hematoma or
additional complication. Dressings were placed. The patient
tolerated the above procedures procedure well without immediate
postprocedural complication.
FINDINGS: Nodule reference number based on prior diagnostic ultrasound: 1

Maximum size: 2.6 cm

Location: Left; Mid

ACR TI-RADS risk category: TR3 (3 points)

Reason for biopsy: meets ACR TI-RADS criteria

Ultrasound imaging confirms appropriate placement of the needles
within the thyroid nodule.
IMPRESSION: Technically successful ultrasound guided fine needle aspiration of
left mid thyroid nodule. Read by: Rakhi Zacharias, NP

## 2022-08-16 ENCOUNTER — Ambulatory Visit (INDEPENDENT_AMBULATORY_CARE_PROVIDER_SITE_OTHER): Payer: Medicare PPO | Admitting: Family Medicine

## 2022-08-16 ENCOUNTER — Ambulatory Visit: Payer: Medicare PPO | Admitting: Nurse Practitioner

## 2022-08-16 VITALS — BP 110/72 | HR 87 | Temp 97.8°F | Wt 180.2 lb

## 2022-08-16 DIAGNOSIS — J069 Acute upper respiratory infection, unspecified: Secondary | ICD-10-CM | POA: Diagnosis not present

## 2022-08-16 DIAGNOSIS — R059 Cough, unspecified: Secondary | ICD-10-CM | POA: Diagnosis not present

## 2022-08-16 DIAGNOSIS — H65192 Other acute nonsuppurative otitis media, left ear: Secondary | ICD-10-CM | POA: Diagnosis not present

## 2022-08-16 DIAGNOSIS — R0981 Nasal congestion: Secondary | ICD-10-CM | POA: Diagnosis not present

## 2022-08-16 LAB — POCT INFLUENZA A/B
Influenza A, POC: NEGATIVE
Influenza B, POC: NEGATIVE

## 2022-08-16 LAB — POC COVID19 BINAXNOW: SARS Coronavirus 2 Ag: NEGATIVE

## 2022-08-16 MED ORDER — CEPHALEXIN 500 MG PO CAPS: 500.0000 mg | ORAL_CAPSULE | Freq: Two times a day (BID) | ORAL | 0 refills | Status: AC

## 2022-08-16 NOTE — Progress Notes (Signed)
Subjective:    Patient ID: Madison Rowe, female    DOB: 09/15/1950, 71 y.o.   MRN: [id number]  Chief Complaint  Patient presents with   Nasal Congestion   OTHER    Pt reports sx of nasal congestion-blowing up green mucus, coughing up green phlegm, left ear pain, bodyache. Sx started last Thursday or Friday. Added husband was tested positive for flu.     HPI Patient was seen today for acute concern.  Patient endorses cough, rhinorrhea, hoarse voice, fatigue, left ear pain x 5 days.  Had some nausea last night.  Having to blow nose hard/forcefully.  Denies fever, chills, headaches, sore throat.  Tried Tylenol, Sudafed, Claritin, Flonase, Mucinex for symptoms.  Patient states her husband tested positive for flu yesterday.  Patient endorses recently being on Augmentin and doxycycline in the last month or so.  Past Medical History:  Diagnosis Date   Allergy    Anemia    Arthritis    Osteoporosis 2010   Vitamin D deficient osteomalacia 2010    Allergies  Allergen Reactions   Alendronate Sodium [Alendronate Sodium]     nausea   Olmesartan Other (See Comments)    quizzy   Codeine Nausea And Vomiting    ROS General: Denies fever, chills, night sweats, changes in weight, changes in appetite + fatigue HEENT: Denies headaches, ear pain, changes in vision, rhinorrhea, sore throat  +rhinorrhea, hoarse voice, left ear pain CV: Denies CP, palpitations, SOB, orthopnea Pulm: Denies SOB, wheezing  +cough GI: Denies abdominal pain, vomiting, diarrhea, constipation  + nausea GU: Denies dysuria, hematuria, frequency, vaginal discharge Msk: Denies muscle cramps, joint pains Neuro: Denies weakness, numbness, tingling Skin: Denies rashes, bruising Psych: Denies depression, anxiety, hallucinations     Objective:    Blood pressure 110/72, pulse 87, temperature 97.8 F (36.6 C), temperature source Oral, weight 180 lb 3.2 oz (81.7 kg), SpO2 95 %.  Gen. Pleasant, well-nourished, in no distress,  normal affect   HEENT: [place]/AT, face symmetric, conjunctiva clear, no scleral icterus, PERRLA, EOMI, nares patent without drainage, pharynx without erythema or exudate.  Right TM partially occluded with cerumen.  Left TM full small area of suppurative fluid and blood behind TM.  Cervical lymphadenopathy. Lungs: no accessory muscle use, CTAB, no wheezes or rales Cardiovascular: RRR, no m/r/g, no peripheral edema Musculoskeletal: No deformities, no cyanosis or clubbing, normal tone Neuro:  A&Ox3, CN II-XII intact, normal gait Skin:  Warm, no lesions/ rash   Wt Readings from Last 3 Encounters:  08/16/22 180 lb 3.2 oz (81.7 kg)  07/09/22 183 lb (83 kg)  06/20/22 186 lb (84.4 kg)    Lab Results  Component Value Date   WBC 5.4 06/07/2022   HGB 13.2 06/07/2022   HCT 38.7 06/07/2022   PLT 188.0 06/07/2022   GLUCOSE 92 11/23/2021   CHOL 161 06/07/2022   TRIG 53.0 06/07/2022   HDL 70.20 06/07/2022   LDLDIRECT 122.7 09/27/2011   LDLCALC 80 06/07/2022   ALT 17 06/07/2022   AST 17 06/07/2022   NA 141 11/23/2021   K 4.2 11/23/2021   CL 100 11/23/2021   CREATININE 0.71 11/23/2021   BUN 17 11/23/2021   CO2 27 11/23/2021   TSH 3.00 06/07/2022    Assessment/Plan:  Other acute nonsuppurative otitis media of left ear, recurrence not specified - Plan: cephALEXin (KEFLEX) 500 MG capsule  Viral URI with cough  Cough, unspecified type - Plan: POC COVID-19, POC Influenza A/B  Nasal congestion - Plan: POC COVID-19,  POC Influenza A/B  Viral URI symptoms with flu exposure.  COVID and flu testing negative in clinic.  Start Keflex for AOM given recent use of doxycycline and Augmentin.  Continue supportive care with OTC/cold medications.  Given precautions.  F/u as needed with PCP.  Grier Mitts, MD

## 2022-09-02 ENCOUNTER — Other Ambulatory Visit: Payer: Self-pay | Admitting: Internal Medicine

## 2022-09-02 DIAGNOSIS — E785 Hyperlipidemia, unspecified: Secondary | ICD-10-CM

## 2022-09-17 ENCOUNTER — Ambulatory Visit: Payer: Medicare PPO | Admitting: Pulmonary Disease

## 2022-09-19 ENCOUNTER — Encounter: Payer: Self-pay | Admitting: Pulmonary Disease

## 2022-09-19 ENCOUNTER — Ambulatory Visit: Payer: Medicare PPO | Admitting: Pulmonary Disease

## 2022-09-19 VITALS — BP 124/78 | HR 76 | Temp 97.8°F | Ht 63.0 in | Wt 184.2 lb

## 2022-09-19 DIAGNOSIS — I272 Pulmonary hypertension, unspecified: Secondary | ICD-10-CM

## 2022-09-19 NOTE — Patient Instructions (Addendum)
Nice to see you again  Continue Lasix 20 mg daily  I will send a message Dr. Ronnald Ramp and Dr. Gasper Sells about taking over prescription for Lasix.  If there is any issues happy to intervene.  If they decline I am happy to continue to provide refills in the future.  Assuming the Lasix prescription was transferred, you can follow-up as needed.  Please contact us with any concerns regarding breathing, oxygen, cough, etc.  Happy to see you at any time.

## 2022-09-19 NOTE — Progress Notes (Unsigned)
    Subjective:    Patient ID: Madison Rowe, female    DOB: [birth year], 72 y.o.   MRN: [id number]      HPI Kona is here for No chief complaint on file.    Clogged ears - ? wax    Medications and allergies reviewed with patient and updated if appropriate.  Current Outpatient Medications on File Prior to Visit  Medication Sig Dispense Refill   atorvastatin (LIPITOR) 10 MG tablet TAKE 1 TABLET(10 MG) BY MOUTH DAILY 90 tablet 1   Cholecalciferol 50 MCG (2000 UT) TABS Take 1 tablet (2,000 Units total) by mouth daily. 90 tablet 1   clobetasol cream (TEMOVATE) 0.05 % APPLY EXTERNALLY TO THE AFFECTED AREA TWICE DAILY (Patient taking differently: as needed (rash).) 60 g 1   conjugated estrogens (PREMARIN) vaginal cream Place vaginally as needed (dryness). 2-3 times weekly.     Dermatological Products, Misc. (DERMEND BRUISE FORMULA) LOTN as needed. bruising     Dietary Management Product (FOSTEUM PLUS) CAPS Take 1 capsule by mouth 2 (two) times daily. (Patient not taking: Reported on 08/16/2022) 180 capsule 1   fluticasone (FLONASE) 50 MCG/ACT nasal spray Place 2 sprays into both nostrils daily.     furosemide (LASIX) 20 MG tablet Take 1 tablet (20 mg total) by mouth daily. 30 tablet 2   guaifenesin (HUMIBID E) 400 MG TABS tablet Take 400 mg by mouth every 4 (four) hours. (Patient not taking: Reported on 08/16/2022)     loratadine (CLARITIN) 10 MG tablet Take 10 mg by mouth daily.     pseudoephedrine (SUDAFED) 30 MG tablet Take 30 mg by mouth every 4 (four) hours as needed for congestion.     No current facility-administered medications on file prior to visit.    Review of Systems     Objective:  There were no vitals filed for this visit. BP Readings from Last 3 Encounters:  09/19/22 124/78  08/16/22 110/72  07/13/22 112/78   Wt Readings from Last 3 Encounters:  09/19/22 184 lb 3.2 oz (83.6 kg)  08/16/22 180 lb 3.2 oz (81.7 kg)  07/09/22 183 lb (83 kg)   There is no  height or weight on file to calculate BMI.    Physical Exam         Assessment & Plan:    See Problem List for Assessment and Plan of chronic medical problems.

## 2022-09-19 NOTE — Progress Notes (Signed)
$'@Patient'x$  ID: Madison Rowe, female    DOB: 23-Jan-1951, 72 y.o.   MRN: [id number]  Chief Complaint  Patient presents with   Follow-up    No c/o     Referring provider: Janith Lima, MD  HPI:    72 y.o. woman with shortness of breath previously seen by my pulmonary colleague who I am seeing in follow up for evaluation of pulmonary hypertension discovered on echocardiogram with normalization of estimated pressures after initiating lasix.  Most recent cardiology note reviewed.  Remains well.  Dyspnea minimal.  Remains on Lasix 20 mg daily.    No real concern today.  Answering refills of Lasix to PCP or cardiologist.  She is amenable to this.  Most recent GI note reviewed.  Most recent note from primary care practice reviewed.  HPI at initial visit: Patient chief complaint dyspnea on exertion.  Present for the last year or so.  Gradually worsened maybe over the last few months.  She notes in July 2021 she was at her second home near [place].  She does lower extremity swelling and took one of her husband's hydrochlorothiazide thinking it was a fluid pill.  Shortly thereafter she felt dizzy, lightheaded.  She went to local ED and they did imaging including a CT scan with contrast to eval for PE which was negative for clot, lower extremity Dopplers were negative for DVT.  CT scan showed per report bilateral pleural effusions as well as bilateral groundglass opacities and thickened of the bronchovascular bundles read as pulmonary edema.  No further work-up was done for this.  Daily, there is initially discovered thyroid nodule that CT scan for which she underwent thyroid biopsy in the ED.  She has ongoing dyspnea on exertion was referred to pulmonary.  Recently seen by one of my colleagues.  TTE was ordered and performed 06/08/2020 which is personally reviewed and demonstrates mild elevation of PA pressures, preserved RV function, mildly dilated RA, estimated RA pressure of 15 mmHg.  This also  demonstrated mild to moderate mitral valve regurg.  She has not taken any fluid pills per her report.  Chest x-ray 05/13/2020 reviewed and interpreted as clear lungs,.  PMH: Hyperlipidemia, thyroid nodule Surgical history: Thyroid biopsy Family history: Denies significant respiratory issues in first-degree relatives Social history: Never smoker, lives in [place], a second home at [place] spends a good amount of time there each year   Questionaires / Pulmonary Flowsheets:   ACT:      No data to display          MMRC:     No data to display          Epworth:      No data to display          Tests:   FENO:  No results found for: "NITRICOXIDE"  PFT:    Latest Ref Rng & Units 09/06/2020    3:05 PM  PFT Results  FVC-Pre L 2.79   FVC-Predicted Pre % 98   FVC-Post L 2.74   FVC-Predicted Post % 97   Pre FEV1/FVC % % 82   Post FEV1/FCV % % 84   FEV1-Pre L 2.29   FEV1-Predicted Pre % 105   FEV1-Post L 2.31   DLCO uncorrected ml/min/mmHg 20.42   DLCO UNC% % 84   DLCO corrected ml/min/mmHg 20.42   DLCO COR %Predicted % 84   DLVA Predicted % 93   TLC L 4.92   TLC % Predicted %  97   RV % Predicted % 90    Personally reviewed and interpreted as normal PFTs WALK:     05/12/2020    4:17 PM  SIX MIN WALK  Supplimental Oxygen during Test? (L/min) No  Tech Comments: fast pace/no SOB//lmr    Imaging: No results found.  Lab Results: Personally reviewed, normal renal function, highest absolute eosinophil count of 150 noted 04/19/2020, BNP normal 96 05/12/2020 CBC    Component Value Date/Time   WBC 5.4 06/07/2022 0951   RBC 4.29 06/07/2022 0951   HGB 13.2 06/07/2022 0951   HCT 38.7 06/07/2022 0951   PLT 188.0 06/07/2022 0951   MCV 90.3 06/07/2022 0951   MCH 26.1 (L) 04/19/2020 1103   MCHC 34.0 06/07/2022 0951   RDW 13.8 06/07/2022 0951   LYMPHSABS 1.7 06/07/2022 0951   MONOABS 0.5 06/07/2022 0951   EOSABS 0.1 06/07/2022 0951   BASOSABS 0.0 06/07/2022  0951    BMET    Component Value Date/Time   NA 141 11/23/2021 0902   K 4.2 11/23/2021 0902   CL 100 11/23/2021 0902   CO2 27 11/23/2021 0902   GLUCOSE 92 11/23/2021 0902   GLUCOSE 80 08/15/2021 1142   BUN 17 11/23/2021 0902   CREATININE 0.71 11/23/2021 0902   CREATININE 0.67 04/19/2020 1103   CALCIUM 9.5 11/23/2021 0902   GFRNONAA 88 04/19/2020 1103   GFRAA 102 04/19/2020 1103    BNP No results found for: "BNP"  ProBNP    Component Value Date/Time   PROBNP 96.0 05/12/2020 1623    Specialty Problems       Pulmonary Problems   Allergic rhinitis   Ground glass opacity present on imaging of lung    Allergies  Allergen Reactions   Alendronate Sodium [Alendronate Sodium]     nausea   Olmesartan Other (See Comments)    quizzy   Codeine Nausea And Vomiting    Immunization History  Administered Date(s) Administered   Fluad Quad(high Dose 65+) 06/16/2019, 07/13/2022   Influenza, High Dose Seasonal PF 07/11/2016, 06/17/2017, 07/19/2020   Influenza-Unspecified 06/21/2014, 07/30/2018, 07/25/2021   PFIZER(Purple Top)SARS-COV-2 Vaccination 09/30/2019, 10/22/2019, 06/15/2020, 01/23/2021   Pfizer Covid-19 Vaccine Bivalent Booster 59yr & up 07/25/2021   Pneumococcal Conjugate-13 07/11/2016   Pneumococcal Polysaccharide-23 07/11/2017   Tdap 09/20/2010, 03/07/2021   Zoster Recombinat (Shingrix) 01/21/2017, 07/11/2017   Zoster, Live 10/19/2009    Past Medical History:  Diagnosis Date   Allergy    Anemia    Arthritis    Osteoporosis 2010   Vitamin D deficient osteomalacia 2010    Tobacco History: Social History   Tobacco Use  Smoking Status Never  Smokeless Tobacco Never   Counseling given: Not Answered   Continue to not smoke  Outpatient Encounter Medications as of 09/19/2022  Medication Sig   atorvastatin (LIPITOR) 10 MG tablet TAKE 1 TABLET(10 MG) BY MOUTH DAILY   Cholecalciferol 50 MCG (2000 UT) TABS Take 1 tablet (2,000 Units total) by mouth daily.    clobetasol cream (TEMOVATE) 0.05 % APPLY EXTERNALLY TO THE AFFECTED AREA TWICE DAILY (Patient taking differently: as needed (rash).)   conjugated estrogens (PREMARIN) vaginal cream Place vaginally as needed (dryness). 2-3 times weekly.   Dermatological Products, Misc. (DERMEND BRUISE FORMULA) LOTN as needed. bruising   fluticasone (FLONASE) 50 MCG/ACT nasal spray Place 2 sprays into both nostrils daily.   furosemide (LASIX) 20 MG tablet Take 1 tablet (20 mg total) by mouth daily.   loratadine (CLARITIN) 10 MG tablet Take 10 mg  by mouth daily.   pseudoephedrine (SUDAFED) 30 MG tablet Take 30 mg by mouth every 4 (four) hours as needed for congestion.   Dietary Management Product (FOSTEUM PLUS) CAPS Take 1 capsule by mouth 2 (two) times daily. (Patient not taking: Reported on 08/16/2022)   guaifenesin (HUMIBID E) 400 MG TABS tablet Take 400 mg by mouth every 4 (four) hours. (Patient not taking: Reported on 08/16/2022)   No facility-administered encounter medications on file as of 09/19/2022.     Review of Systems  n/a  Physical Exam  BP 124/78   Pulse 76   Temp 97.8 F (36.6 C) (Oral)   Ht '5\' 3"'$  (1.6 m)   Wt 184 lb 3.2 oz (83.6 kg)   SpO2 95%   BMI 32.63 kg/m   Wt Readings from Last 5 Encounters:  09/19/22 184 lb 3.2 oz (83.6 kg)  08/16/22 180 lb 3.2 oz (81.7 kg)  07/09/22 183 lb (83 kg)  06/20/22 186 lb (84.4 kg)  06/08/22 183 lb (83 kg)    BMI Readings from Last 5 Encounters:  09/19/22 32.63 kg/m  08/16/22 30.93 kg/m  07/13/22 31.41 kg/m  07/09/22 31.41 kg/m  06/20/22 32.95 kg/m     Physical Exam General: Well-appearing, in no acute distress Eyes: EOMI, no icterus Neck: No JVP appreciated Respiratory: Clear to auscultation bilaterally, no wheezes Cardiovascular: Regular rate rhythm, 2 out of 6 late systolic murmur heard best at left upper sternal border, no edema Neuro: Normal gait, no weakness Psych: Normal mood, full affect   Assessment & Plan:    Transient elevation of right-sided filling pressures of the heart: Based on mildly elevated PASP pressure in the setting of elevated right atrial pressure on recent TTE.  Highest concern for group 2 disease given mitral valve regurgitation seen on TTE.  No obvious risk factors for group 3 disease. PFTs normal. No signs or symptoms of connective tissue disease and no family history of the same.  No methamphetamine or other illicit substance use.  No history of DVT or PE to suggest chronic thromboembolic disease.  Given mild volume overload with elevated JVP on initial exam as well as elevated right atrial pressure on recent TTE,started lasix  20 mg oral daily 11/21. DOE better.  Repeat TTE with normalization of right-sided pressures after starting Lasix.  Refilled, will discuss with PCP and cardiologist taken over prescription, if so can follow-up as needed.  Dyspnea on exertion: Likely volume overload in setting of mitral valve regurgitation now resolved with euvolemia and stable Lasix dosing. PFTs normal.    Return if symptoms worsen or fail to improve.   Lanier Clam, MD 09/19/2022

## 2022-09-20 ENCOUNTER — Encounter: Payer: Self-pay | Admitting: Internal Medicine

## 2022-09-20 ENCOUNTER — Ambulatory Visit (INDEPENDENT_AMBULATORY_CARE_PROVIDER_SITE_OTHER): Payer: Medicare PPO | Admitting: Internal Medicine

## 2022-09-20 VITALS — BP 120/78 | HR 67 | Temp 98.0°F | Ht 63.0 in | Wt 182.0 lb

## 2022-09-20 DIAGNOSIS — H6993 Unspecified Eustachian tube disorder, bilateral: Secondary | ICD-10-CM

## 2022-09-20 DIAGNOSIS — H6121 Impacted cerumen, right ear: Secondary | ICD-10-CM

## 2022-09-20 NOTE — Progress Notes (Signed)
PRE-PROCEDURE EXAM: Right TM cannot be visualized due to total occlusion/impaction of the ear canal.  PROCEDURE INDICATION: remove wax to visualize ear drum & relieve discomfort  CONSENT:  Verbal     PROCEDURE NOTE:     right EAR:  I used warm water irrigation under direct visualization with the otoscope to free the wax bolus from the ear canal.    POST- PROCEDURE EXAM:  Right TMs successfully visualized and found to have no erythema     The patient tolerated the procedure well.

## 2022-09-20 NOTE — Patient Instructions (Addendum)
Your ear was cleaned out.     Medications changes include :  try the flonase and sudafed      Return if symptoms worsen or fail to improve.     Eustachian Tube Dysfunction  Eustachian tube dysfunction refers to a condition in which a blockage develops in the narrow passage that connects the middle ear to the back of the nose (eustachian tube). The eustachian tube regulates air pressure in the middle ear by letting air move between the ear and nose. It also helps to drain fluid from the middle ear space. Eustachian tube dysfunction can affect one or both ears. When the eustachian tube does not function properly, air pressure, fluid, or both can build up in the middle ear. What are the causes? This condition occurs when the eustachian tube becomes blocked or cannot open normally. Common causes of this condition include: Ear infections. Colds and other infections that affect the nose, mouth, and throat (upper respiratory tract). Allergies. Irritation from cigarette smoke. Irritation from stomach acid coming up into the esophagus (gastroesophageal reflux). The esophagus is the part of the body that moves food from the mouth to the stomach. Sudden changes in air pressure, such as from descending in an airplane or scuba diving. Abnormal growths in the nose or throat, such as: Growths that line the nose (nasal polyps). Abnormal growth of cells (tumors). Enlarged tissue at the back of the throat (adenoids). What increases the risk? You are more likely to develop this condition if: You smoke. You are overweight. You are a child who has: Certain birth defects of the mouth, such as cleft palate. Large tonsils or adenoids. What are the signs or symptoms? Common symptoms of this condition include: A feeling of fullness in the ear. Ear pain. Clicking or popping noises in the ear. Ringing in the ear (tinnitus). Hearing loss. Loss of balance. Dizziness. Symptoms may get worse  when the air pressure around you changes, such as when you travel to an area of high elevation, fly on an airplane, or go scuba diving. How is this diagnosed? This condition may be diagnosed based on: Your symptoms. A physical exam of your ears, nose, and throat. Tests, such as those that measure: The movement of your eardrum. Your hearing (audiometry). How is this treated? Treatment depends on the cause and severity of your condition. In mild cases, you may relieve your symptoms by moving air into your ears. This is called "popping the ears." In more severe cases, or if you have symptoms of fluid in your ears, treatment may include: Medicines to relieve congestion (decongestants). Medicines that treat allergies (antihistamines). Nasal sprays or ear drops that contain medicines that reduce swelling (steroids). A procedure to drain the fluid in your eardrum. In this procedure, a small tube may be placed in the eardrum to: Drain the fluid. Restore the air in the middle ear space. A procedure to insert a balloon device through the nose to inflate the opening of the eustachian tube (balloon dilation). Follow these instructions at home: Lifestyle Do not do any of the following until your health care provider approves: Travel to high altitudes. Fly in airplanes. Work in a Pension scheme manager or room. Scuba dive. Do not use any products that contain nicotine or tobacco. These products include cigarettes, chewing tobacco, and vaping devices, such as e-cigarettes. If you need help quitting, ask your health care provider. Keep your ears dry. Wear fitted earplugs during showering and bathing. Dry your ears completely  after. General instructions Take over-the-counter and prescription medicines only as told by your health care provider. Use techniques to help pop your ears as recommended by your health care provider. These may include: Chewing gum. Yawning. Frequent, forceful swallowing. Closing  your mouth, holding your nose closed, and gently blowing as if you are trying to blow air out of your nose. Keep all follow-up visits. This is important. Contact a health care provider if: Your symptoms do not go away after treatment. Your symptoms come back after treatment. You are unable to pop your ears. You have: A fever. Pain in your ear. Pain in your head or neck. Fluid draining from your ear. Your hearing suddenly changes. You become very dizzy. You lose your balance. Get help right away if: You have a sudden, severe increase in any of your symptoms. Summary Eustachian tube dysfunction refers to a condition in which a blockage develops in the eustachian tube. It can be caused by ear infections, allergies, inhaled irritants, or abnormal growths in the nose or throat. Symptoms may include ear pain or fullness, hearing loss, or ringing in the ears. Mild cases are treated with techniques to unblock the ears, such as yawning or chewing gum. More severe cases are treated with medicines or procedures. This information is not intended to replace advice given to you by your health care provider. Make sure you discuss any questions you have with your health care provider. Document Revised: 11/07/2020 Document Reviewed: 11/07/2020 Elsevier Patient Education  [place].

## 2022-11-20 ENCOUNTER — Encounter: Payer: Self-pay | Admitting: Internal Medicine

## 2022-11-20 ENCOUNTER — Encounter: Payer: Self-pay | Admitting: Pulmonary Disease

## 2022-11-21 ENCOUNTER — Other Ambulatory Visit: Payer: Self-pay | Admitting: Internal Medicine

## 2022-11-21 DIAGNOSIS — R0609 Other forms of dyspnea: Secondary | ICD-10-CM

## 2022-11-21 DIAGNOSIS — I272 Pulmonary hypertension, unspecified: Secondary | ICD-10-CM

## 2022-11-21 DIAGNOSIS — I10 Essential (primary) hypertension: Secondary | ICD-10-CM

## 2022-11-21 MED ORDER — FUROSEMIDE 20 MG PO TABS: 20.0000 mg | ORAL_TABLET | Freq: Every day | ORAL | 0 refills | Status: DC

## 2023-01-11 ENCOUNTER — Encounter: Payer: Self-pay | Admitting: Internal Medicine

## 2023-01-24 ENCOUNTER — Encounter: Payer: Self-pay | Admitting: Internal Medicine

## 2023-01-24 ENCOUNTER — Ambulatory Visit: Payer: Medicare PPO | Admitting: Internal Medicine

## 2023-01-24 VITALS — BP 118/78 | HR 78 | Temp 98.3°F | Ht 63.0 in | Wt 181.0 lb

## 2023-01-24 DIAGNOSIS — E559 Vitamin D deficiency, unspecified: Secondary | ICD-10-CM

## 2023-01-24 DIAGNOSIS — E611 Iron deficiency: Secondary | ICD-10-CM

## 2023-01-24 DIAGNOSIS — E038 Other specified hypothyroidism: Secondary | ICD-10-CM

## 2023-01-24 DIAGNOSIS — M722 Plantar fascial fibromatosis: Secondary | ICD-10-CM | POA: Diagnosis not present

## 2023-01-24 DIAGNOSIS — M810 Age-related osteoporosis without current pathological fracture: Secondary | ICD-10-CM | POA: Diagnosis not present

## 2023-01-24 DIAGNOSIS — I10 Essential (primary) hypertension: Secondary | ICD-10-CM | POA: Diagnosis not present

## 2023-01-24 LAB — CBC WITH DIFFERENTIAL/PLATELET
Basophils Absolute: 0 10*3/uL (ref 0.0–0.1)
Basophils Relative: 0.4 % (ref 0.0–3.0)
Eosinophils Absolute: 0.1 10*3/uL (ref 0.0–0.7)
Eosinophils Relative: 1.3 % (ref 0.0–5.0)
HCT: 37.8 % (ref 36.0–46.0)
Hemoglobin: 12.7 g/dL (ref 12.0–15.0)
Lymphocytes Relative: 26.2 % (ref 12.0–46.0)
Lymphs Abs: 1.9 10*3/uL (ref 0.7–4.0)
MCHC: 33.7 g/dL (ref 30.0–36.0)
MCV: 86.9 fl (ref 78.0–100.0)
Monocytes Absolute: 0.6 10*3/uL (ref 0.1–1.0)
Monocytes Relative: 8.6 % (ref 3.0–12.0)
Neutro Abs: 4.6 10*3/uL (ref 1.4–7.7)
Neutrophils Relative %: 63.5 % (ref 43.0–77.0)
Platelets: 193 10*3/uL (ref 150.0–400.0)
RBC: 4.35 Mil/uL (ref 3.87–5.11)
RDW: 14.6 % (ref 11.5–15.5)
WBC: 7.3 10*3/uL (ref 4.0–10.5)

## 2023-01-24 LAB — BASIC METABOLIC PANEL
BUN: 15 mg/dL (ref 6–23)
CO2: 32 mEq/L (ref 19–32)
Calcium: 9.4 mg/dL (ref 8.4–10.5)
Chloride: 101 mEq/L (ref 96–112)
Creatinine, Ser: 0.81 mg/dL (ref 0.40–1.20)
GFR: 71.63 mL/min (ref >60.00)
Glucose, Bld: 90 mg/dL (ref 70–99)
Potassium: 3.8 mEq/L (ref 3.5–5.1)
Sodium: 139 mEq/L (ref 135–145)

## 2023-01-24 LAB — TSH: TSH: 1.68 u[IU]/mL (ref 0.35–5.50)

## 2023-01-24 LAB — VITAMIN D 25 HYDROXY (VIT D DEFICIENCY, FRACTURES): VITD: 34.7 ng/mL (ref 30.00–100.00)

## 2023-01-24 NOTE — Patient Instructions (Signed)

## 2023-01-24 NOTE — Progress Notes (Unsigned)
Subjective:  Patient ID: Madison Rowe, female    DOB: [birth year]  Age: 72 y.o. MRN: [id number]  CC: No chief complaint on file.   HPI CECILEE MECHLING presents for ***  Outpatient Medications Prior to Visit  Medication Sig Dispense Refill   atorvastatin (LIPITOR) 10 MG tablet TAKE 1 TABLET(10 MG) BY MOUTH DAILY 90 tablet 1   Cholecalciferol 50 MCG (2000 UT) TABS Take 1 tablet (2,000 Units total) by mouth daily. 90 tablet 1   clobetasol cream (TEMOVATE) 0.05 % APPLY EXTERNALLY TO THE AFFECTED AREA TWICE DAILY (Patient taking differently: as needed (rash).) 60 g 1   Dermatological Products, Misc. (DERMEND BRUISE FORMULA) LOTN as needed. bruising     Dietary Management Product (FOSTEUM PLUS) CAPS Take 1 capsule by mouth 2 (two) times daily. 180 capsule 1   estradiol (ESTRACE) 0.1 MG/GM vaginal cream SMARTSIG:Gram(s) Topical 3 Times a Week     fluticasone (FLONASE) 50 MCG/ACT nasal spray Place 2 sprays into both nostrils daily.     furosemide (LASIX) 20 MG tablet Take 1 tablet (20 mg total) by mouth daily. 90 tablet 0   loratadine (CLARITIN) 10 MG tablet Take 10 mg by mouth daily.     conjugated estrogens (PREMARIN) vaginal cream Place vaginally as needed (dryness). 2-3 times weekly. (Patient not taking: Reported on 01/24/2023)     guaifenesin (HUMIBID E) 400 MG TABS tablet Take 400 mg by mouth every 4 (four) hours. (Patient not taking: Reported on 09/20/2022)     No facility-administered medications prior to visit.    ROS Review of Systems  Objective:  BP 118/78 (BP Location: Left Arm, Patient Position: Sitting, Cuff Size: Normal)   Pulse 78   Temp 98.3 F (36.8 C) (Oral)   Ht 5\' 3"  (1.6 m)   Wt 181 lb (82.1 kg)   SpO2 96%   BMI 32.06 kg/m   BP Readings from Last 3 Encounters:  01/24/23 118/78  09/20/22 120/78  09/19/22 124/78    Wt Readings from Last 3 Encounters:  01/24/23 181 lb (82.1 kg)  09/20/22 182 lb (82.6 kg)  09/19/22 184 lb 3.2 oz (83.6 kg)    Physical  Exam  Lab Results  Component Value Date   WBC 7.3 01/24/2023   HGB 12.7 01/24/2023   HCT 37.8 01/24/2023   PLT 193.0 01/24/2023   GLUCOSE 90 01/24/2023   CHOL 161 06/07/2022   TRIG 53.0 06/07/2022   HDL 70.20 06/07/2022   LDLDIRECT 122.7 09/27/2011   LDLCALC 80 06/07/2022   ALT 17 06/07/2022   AST 17 06/07/2022   NA 139 01/24/2023   K 3.8 01/24/2023   CL 101 01/24/2023   CREATININE 0.81 01/24/2023   BUN 15 01/24/2023   CO2 32 01/24/2023   TSH 1.68 01/24/2023    CT CARDIAC SCORING (SELF PAY ONLY)  Addendum Date: 05/23/2022   ADDENDUM REPORT: 05/23/2022 09:38 ADDENDUM: The following report is an over-read performed by radiologist Dr. Maudry Mayhew of [facility] on May 23, 2022. This over-read does not include interpretation of cardiac or coronary anatomy or pathology. The coronary calcium score interpretation by the cardiologist is attached. COMPARISON:  None. FINDINGS: Vascular: No acute non-cardiac vascular finding. Mediastinum/Nodes: Within the visualized portions of the chest there are no pathologically enlarged mediastinal, or hilar lymph nodes, noting limited sensitivity for the detection of hilar adenopathy on this noncontrast study. Small hiatal hernia. Lungs/Pleura: Clustered nodularity in the right-greater-than-left posterior upper lobes for instance on image 11/604 measuring up  to 2 mm. Within the visualized portions of the thorax there is no pleural effusions and no pneumothorax Upper Abdomen: Visualized portions of the upper abdomen are unremarkable. Musculoskeletal: No acute osseous abnormality no acute osseous abnormality. IMPRESSION: 1. Clustered nodularity in the posterior right-greater-than-left lower lobes measuring up to 2 mm likely infectious or inflammatory. Per Fleischner Society Guidelines, if patient is low risk for malignancy, no routine follow-up imaging is recommended; if patient is high risk for malignancy, a non-contrast Chest CT at 12 months  is optional. If performed and the nodule is stable at 12 months, no further follow-up is recommended. These guidelines do not apply to immunocompromised patients and patients with cancer. Follow up in patients with significant comorbidities as clinically warranted. For lung cancer screening, adhere to Lung-RADS guidelines. Reference: Radiology. 2017; 284(1):228-43. 2. Small hiatal hernia. Electronically Signed   By: Maudry Mayhew M.D.   On: 05/23/2022 09:38   Result Date: 05/23/2022 CLINICAL DATA:  Cardiovascular Disease Risk stratification EXAM: Coronary Calcium Score TECHNIQUE: A gated, non-contrast computed tomography scan of the heart was performed using 3mm slice thickness. Axial images were analyzed on a dedicated workstation. Calcium scoring of the coronary arteries was performed using the Agatston method. FINDINGS: Coronary Calcium Score: Left main: 0 Left anterior descending artery: 0 Left circumflex artery: 0 Right coronary artery: 0 Total: 0 Percentile: 0 Pericardium: Normal. Ascending Aorta: Normal caliber. Non-cardiac: See separate report from [facility]. IMPRESSION: Coronary calcium score of 0. This was 0 percentile for age-, race-, and sex-matched controls. RECOMMENDATIONS: Coronary artery calcium (CAC) score is a strong predictor of incident coronary heart disease (CHD) and provides predictive information beyond traditional risk factors. CAC scoring is reasonable to use in the decision to withhold, postpone, or initiate statin therapy in intermediate-risk or selected borderline-risk asymptomatic adults (age 71-75 years and LDL-C >=70 to <190 mg/dL) who do not have diabetes or established atherosclerotic cardiovascular disease (ASCVD).* In intermediate-risk (10-year ASCVD risk >=7.5% to <20%) adults or selected borderline-risk (10-year ASCVD risk >=5% to <7.5%) adults in whom a CAC score is measured for the purpose of making a treatment decision the following recommendations have been  made: If CAC=0, it is reasonable to withhold statin therapy and reassess in 5 to 10 years, as long as higher risk conditions are absent (diabetes mellitus, family history of premature CHD in first degree relatives (males <55 years; females <65 years), cigarette smoking, or LDL >=190 mg/dL). If CAC is 1 to 99, it is reasonable to initiate statin therapy for patients >=65 years of age. If CAC is >=100 or >=75th percentile, it is reasonable to initiate statin therapy at any age. Cardiology referral should be considered for patients with CAC scores >=400 or >=75th percentile. *2018 AHA/ACC/AACVPR/AAPA/ABC/ACPM/ADA/AGS/APhA/ASPC/NLA/PCNA Guideline on the Management of Blood Cholesterol: A Report of the American College of Cardiology/American Heart Association Task Force on Clinical Practice Guidelines. J Am Coll Cardiol. 2019;73(24):3168-3209. Olga Millers, MD Electronically Signed: By: Olga Millers M.D. On: 05/22/2022 17:17    Assessment & Plan:  Essential hypertension -     TSH; Future -     Basic metabolic panel; Future  Other specified hypothyroidism -     TSH; Future  Iron deficiency -     CBC with Differential/Platelet; Future  Vitamin D deficiency -     VITAMIN D 25 Hydroxy (Vit-D Deficiency, Fractures); Future  Age-related osteoporosis without current pathological fracture -     VITAMIN D 25 Hydroxy (Vit-D Deficiency, Fractures); Future -  Basic metabolic panel; Future  Pulmonary hypertension, unspecified (HCC)     Follow-up: Return in about 6 months (around 07/27/2023).  Sanda Linger, MD

## 2023-01-25 ENCOUNTER — Encounter: Payer: Self-pay | Admitting: Internal Medicine

## 2023-01-25 DIAGNOSIS — M722 Plantar fascial fibromatosis: Secondary | ICD-10-CM | POA: Insufficient documentation

## 2023-01-25 MED ORDER — MELOXICAM 7.5 MG PO TABS
7.5000 mg | ORAL_TABLET | ORAL | 0 refills | Status: DC
Start: 2023-01-25 — End: 2023-05-01

## 2023-02-25 ENCOUNTER — Other Ambulatory Visit: Payer: Self-pay | Admitting: Internal Medicine

## 2023-02-25 DIAGNOSIS — I272 Pulmonary hypertension, unspecified: Secondary | ICD-10-CM

## 2023-02-25 DIAGNOSIS — I10 Essential (primary) hypertension: Secondary | ICD-10-CM

## 2023-02-25 DIAGNOSIS — E785 Hyperlipidemia, unspecified: Secondary | ICD-10-CM

## 2023-03-07 DIAGNOSIS — L814 Other melanin hyperpigmentation: Secondary | ICD-10-CM | POA: Diagnosis not present

## 2023-03-07 DIAGNOSIS — L299 Pruritus, unspecified: Secondary | ICD-10-CM | POA: Diagnosis not present

## 2023-03-07 DIAGNOSIS — D1801 Hemangioma of skin and subcutaneous tissue: Secondary | ICD-10-CM | POA: Diagnosis not present

## 2023-03-07 DIAGNOSIS — L578 Other skin changes due to chronic exposure to nonionizing radiation: Secondary | ICD-10-CM | POA: Diagnosis not present

## 2023-03-07 DIAGNOSIS — L821 Other seborrheic keratosis: Secondary | ICD-10-CM | POA: Diagnosis not present

## 2023-03-07 DIAGNOSIS — D2272 Melanocytic nevi of left lower limb, including hip: Secondary | ICD-10-CM | POA: Diagnosis not present

## 2023-03-07 DIAGNOSIS — L570 Actinic keratosis: Secondary | ICD-10-CM | POA: Diagnosis not present

## 2023-03-25 ENCOUNTER — Other Ambulatory Visit: Payer: Self-pay | Admitting: Internal Medicine

## 2023-03-25 ENCOUNTER — Encounter: Payer: Self-pay | Admitting: Internal Medicine

## 2023-03-25 ENCOUNTER — Telehealth: Payer: Self-pay | Admitting: Internal Medicine

## 2023-03-25 DIAGNOSIS — M810 Age-related osteoporosis without current pathological fracture: Secondary | ICD-10-CM

## 2023-03-25 MED ORDER — FOSTEUM PLUS PO CAPS
1.0000 | ORAL_CAPSULE | Freq: Two times a day (BID) | ORAL | 1 refills | Status: DC
Start: 2023-03-25 — End: 2023-10-01

## 2023-03-25 NOTE — Telephone Encounter (Signed)
Prescription Request  03/25/2023  LOV: 01/24/2023  What is the name of the medication or equipment? Dietary Management Product (FOSTEUM PLUS) CAPS   Have you contacted your pharmacy to request a refill? No   Which pharmacy would you like this sent to?     [id number] - [place], [place] - [address] AT [facility] [address] [place] [address] Phone: [phone] Fax: [phone]   Patient notified that their request is being sent to the clinical staff for review and that they should receive a response within 2 business days.   Please advise at Mobile [phone] (mobile)

## 2023-03-26 ENCOUNTER — Ambulatory Visit (HOSPITAL_COMMUNITY): Payer: Medicare PPO | Attending: Internal Medicine

## 2023-03-26 DIAGNOSIS — I340 Nonrheumatic mitral (valve) insufficiency: Secondary | ICD-10-CM | POA: Insufficient documentation

## 2023-03-26 LAB — ECHOCARDIOGRAM COMPLETE
Area-P 1/2: 2.97 cm2
MV M vel: 2.85 m/s
MV Peak grad: 32.5 mmHg
Radius: 0.7 cm
S' Lateral: 2.6 cm

## 2023-03-27 ENCOUNTER — Other Ambulatory Visit: Payer: Self-pay

## 2023-03-27 DIAGNOSIS — I340 Nonrheumatic mitral (valve) insufficiency: Secondary | ICD-10-CM

## 2023-05-01 ENCOUNTER — Other Ambulatory Visit: Payer: Self-pay | Admitting: Internal Medicine

## 2023-05-01 DIAGNOSIS — M722 Plantar fascial fibromatosis: Secondary | ICD-10-CM

## 2023-05-23 ENCOUNTER — Other Ambulatory Visit: Payer: Self-pay | Admitting: Internal Medicine

## 2023-05-23 DIAGNOSIS — I10 Essential (primary) hypertension: Secondary | ICD-10-CM

## 2023-05-23 DIAGNOSIS — I272 Pulmonary hypertension, unspecified: Secondary | ICD-10-CM

## 2023-05-23 DIAGNOSIS — E785 Hyperlipidemia, unspecified: Secondary | ICD-10-CM

## 2023-05-28 DIAGNOSIS — Z1231 Encounter for screening mammogram for malignant neoplasm of breast: Secondary | ICD-10-CM | POA: Diagnosis not present

## 2023-05-28 LAB — HM MAMMOGRAPHY

## 2023-05-29 ENCOUNTER — Encounter: Payer: Self-pay | Admitting: Internal Medicine

## 2023-05-30 DIAGNOSIS — R922 Inconclusive mammogram: Secondary | ICD-10-CM | POA: Diagnosis not present

## 2023-05-31 ENCOUNTER — Encounter: Payer: Self-pay | Admitting: Internal Medicine

## 2023-06-03 ENCOUNTER — Ambulatory Visit (INDEPENDENT_AMBULATORY_CARE_PROVIDER_SITE_OTHER): Payer: Medicare PPO

## 2023-06-03 VITALS — BP 118/78 | HR 78 | Temp 98.3°F | Ht 63.0 in | Wt 181.0 lb

## 2023-06-03 DIAGNOSIS — Z0000 Encounter for general adult medical examination without abnormal findings: Secondary | ICD-10-CM

## 2023-06-03 NOTE — Patient Instructions (Signed)
It was great speaking with you today!  Please schedule your next Medicare Wellness Visit with your Nurse Health Advisor in 1 year by calling [phone]. 

## 2023-06-03 NOTE — Progress Notes (Signed)
Subjective:   Madison Rowe is a 72 y.o. female who presents for Medicare Annual (Subsequent) preventive examination.  Visit Complete: Virtual  I connected with  Madison Rowe on 06/03/23 by a audio enabled telemedicine application and verified that I am speaking with the correct person using two identifiers.  Patient Location: Home  Provider Location: Office/Clinic  I discussed the limitations of evaluation and management by telemedicine. The patient expressed understanding and agreed to proceed.  Patient Medicare AWV questionnaire was completed by the patient on 06/02/2023; I have confirmed that all information answered by patient is correct and no changes since this date.  Cardiac Risk Factors include: advanced age (>69men, >76 women);hypertension;dyslipidemia     Objective:    Today's Vitals   06/03/23 1057  BP: 118/78  Pulse: 78  Temp: 98.3 F (36.8 C)  SpO2: 96%  Weight: 181 lb (82.1 kg)  Height: 5\' 3"  (1.6 m)   Body mass index is 32.06 kg/m. Patient was unable to self-report due to a lack of equipment at home via telehealth.      06/03/2023   10:46 AM 05/07/2022    3:53 PM 05/05/2021    9:28 AM 01/30/2017    4:30 PM  Advanced Directives  Does Patient Have a Medical Advance Directive? Yes Yes Yes Yes  Type of Special educational needs teacher of [place];Living will Living will;Healthcare Power of [employer] Power of [place];Living will  Does patient want to make changes to medical advance directive? No - Patient declined  No - Guardian declined   Copy of Healthcare Power of Attorney in Chart?  No - copy requested No - copy requested     Current Medications (verified) Outpatient Encounter Medications as of 06/03/2023  Medication Sig   atorvastatin (LIPITOR) 10 MG tablet TAKE 1 TABLET(10 MG) BY MOUTH DAILY   Cholecalciferol 50 MCG (2000 UT) TABS Take 1 tablet (2,000 Units total) by mouth daily.   clobetasol cream (TEMOVATE) 0.05 % APPLY EXTERNALLY  TO THE AFFECTED AREA TWICE DAILY (Patient taking differently: as needed (rash).)   Dermatological Products, Misc. (DERMEND BRUISE FORMULA) LOTN as needed. bruising   Dietary Management Product (FOSTEUM PLUS) CAPS Take 1 capsule by mouth 2 (two) times daily.   estradiol (ESTRACE) 0.1 MG/GM vaginal cream SMARTSIG:Gram(s) Topical 3 Times a Week   fluticasone (FLONASE) 50 MCG/ACT nasal spray Place 2 sprays into both nostrils daily.   furosemide (LASIX) 20 MG tablet TAKE 1 TABLET(20 MG) BY MOUTH DAILY   loratadine (CLARITIN) 10 MG tablet Take 10 mg by mouth daily.   meloxicam (MOBIC) 7.5 MG tablet TAKE 1 TABLET(7.5 MG) BY MOUTH EVERY OTHER DAY   [DISCONTINUED] conjugated estrogens (PREMARIN) vaginal cream Place vaginally as needed (dryness). 2-3 times weekly. (Patient not taking: Reported on 01/24/2023)   [DISCONTINUED] Dietary Management Product (FOSTEUM PLUS PO)    [DISCONTINUED] guaifenesin (HUMIBID E) 400 MG TABS tablet Take 400 mg by mouth every 4 (four) hours. (Patient not taking: Reported on 09/20/2022)   No facility-administered encounter medications on file as of 06/03/2023.    Allergies (verified) Alendronate sodium [alendronate sodium], Olmesartan, and Codeine   History: Past Medical History:  Diagnosis Date   Allergy    Anemia    Arthritis    Osteoporosis 2010   Vitamin D deficient osteomalacia 2010   Past Surgical History:  Procedure Laterality Date   BREAST SURGERY     COLONOSCOPY  07/09/2022   2018-polyp   ECTOPIC PREGNANCY SURGERY  FOOT SURGERY Left    laproscopy  1982   TONSILLECTOMY     Family History  Problem Relation Age of Onset   Hypertension Mother    Kidney disease Father    Kidney disease Maternal Grandfather    Cancer Neg Hx    Heart disease Neg Hx    Colon cancer Neg Hx    Esophageal cancer Neg Hx    Rectal cancer Neg Hx    Stomach cancer Neg Hx    Colon polyps Neg Hx    Social History   Socioeconomic History   Marital status: Married     Spouse name: Not on file   Number of children: Not on file   Years of education: Not on file   Highest education level: Master's degree (e.g., MA, MS, MEng, MEd, MSW, MBA)  Occupational History   Not on file  Tobacco Use   Smoking status: Never   Smokeless tobacco: Never  Vaping Use   Vaping status: Never Used  Substance and Sexual Activity   Alcohol use: Yes    Comment: Occasional   Drug use: No   Sexual activity: Yes    Birth control/protection: Surgical  Other Topics Concern   Not on file  Social History Narrative   Not on file   Social Determinants of Health   Financial Resource Strain: Low Risk  (06/03/2023)   Overall Financial Resource Strain (CARDIA)    Difficulty of Paying Living Expenses: Not hard at all  Food Insecurity: No Food Insecurity (06/03/2023)   Hunger Vital Sign    Worried About Running Out of Food in the Last Year: Never true    Ran Out of Food in the Last Year: Never true  Transportation Needs: No Transportation Needs (06/03/2023)   PRAPARE - Administrator, Civil Service (Medical): No    Lack of Transportation (Non-Medical): No  Physical Activity: Sufficiently Active (06/03/2023)   Exercise Vital Sign    Days of Exercise per Week: 5 days    Minutes of Exercise per Session: 60 min  Stress: No Stress Concern Present (06/03/2023)   [employer] of Occupational Health - Occupational Stress Questionnaire    Feeling of Stress : Not at all  Social Connections: Socially Integrated (06/03/2023)   Social Connection and Isolation Panel [NHANES]    Frequency of Communication with Friends and Family: More than three times a week    Frequency of Social Gatherings with Friends and Family: Three times a week    Attends Religious Services: More than 4 times per year    Active Member of Clubs or Organizations: Yes    Attends Engineer, structural: More than 4 times per year    Marital Status: Married    Tobacco Counseling Counseling given:  Not Answered   Clinical Intake:  Pre-visit preparation completed: Yes  Pain : No/denies pain     BMI - recorded: 32 Nutritional Status: BMI > 30  Obese Nutritional Risks: None Diabetes: No  How often do you need to have someone help you when you read instructions, pamphlets, or other written materials from your doctor or pharmacy?: 1 - Never What is the last grade level you completed in school?: graduate school  Interpreter Needed?: No  Information entered by :: Elyse Jarvis, CMA   Activities of Daily Living    06/03/2023   10:46 AM 06/02/2023    1:25 PM  In your present state of health, do you have any difficulty performing the following  activities:  Hearing? 0 0  Vision? 0 0  Difficulty concentrating or making decisions? 0 0  Walking or climbing stairs? 0 0  Dressing or bathing? 0 0  Doing errands, shopping? 0 0  Preparing Food and eating ? N N  Using the Toilet? N N  In the past six months, have you accidently leaked urine? N N  Do you have problems with loss of bowel control? N N  Managing your Medications? N N  Managing your Finances? N N  Housekeeping or managing your Housekeeping? N N    Patient Care Team: Etta Grandchild, MD as PCP - General (Internal Medicine) Christell Constant, MD as PCP - Cardiology (Cardiology) Blima Ledger, OD as Consulting Physician (Optometry)  Indicate any recent Medical Services you may have received from other than [facility] providers in the past year (date may be approximate).     Assessment:   This is a routine wellness examination for [place].  Hearing/Vision screen No results found.   Goals Addressed             This Visit's Progress    Patient Stated       I would like to lose a little more weight.       Depression Screen    06/03/2023   10:45 AM 01/24/2023    1:28 PM 08/16/2022    2:47 PM 07/13/2022    1:34 PM 07/13/2022    1:33 PM 06/20/2022    8:39 AM 05/07/2022    4:04 PM  PHQ 2/9 Scores  PHQ -  2 Score 0 0 1 0 0 1 0  PHQ- 9 Score   2 1       Fall Risk    06/03/2023   10:46 AM 06/02/2023    1:25 PM 01/24/2023    1:28 PM 08/16/2022    2:47 PM 08/16/2022   10:06 AM  Fall Risk   Falls in the past year? 0 0 0 0 0  Number falls in past yr: 0  0 0   Injury with Fall? 0 0 0 0   Risk for fall due to : No Fall Risks  No Fall Risks No Fall Risks   Follow up Falls evaluation completed  Falls evaluation completed Falls evaluation completed     MEDICARE RISK AT HOME: Medicare Risk at Home Any stairs in or around the home?: Yes If so, are there any without handrails?: No Home free of loose throw rugs in walkways, pet beds, electrical cords, etc?: Yes Adequate lighting in your home to reduce risk of falls?: Yes Life alert?: No Use of a cane, walker or w/c?: No Grab bars in the bathroom?: No Shower chair or bench in shower?: No Elevated toilet seat or a handicapped toilet?: No  TIMED UP AND GO:  Was the test performed?  No    Cognitive Function:        06/03/2023   10:47 AM 05/07/2022    3:53 PM  6CIT Screen  What Year? 0 points 0 points  What month? 0 points 0 points  What time? 0 points 0 points  Count back from 20 0 points 0 points  Months in reverse 0 points 0 points  Repeat phrase 0 points 0 points  Total Score 0 points 0 points    Immunizations Immunization History  Administered Date(s) Administered   Fluad Quad(high Dose 65+) 06/16/2019, 07/13/2022   Influenza, High Dose Seasonal PF 07/11/2016, 06/17/2017, 07/19/2020   Influenza-Unspecified 06/21/2014, 07/30/2018,  07/25/2021   PFIZER(Purple Top)SARS-COV-2 Vaccination 09/30/2019, 10/22/2019, 06/15/2020, 01/23/2021   Pfizer Covid-19 Vaccine Bivalent Booster 110yrs & up 07/25/2021   Pneumococcal Conjugate-13 07/11/2016   Pneumococcal Polysaccharide-23 07/11/2017   Tdap 09/20/2010, 03/07/2021   Zoster Recombinant(Shingrix) 01/21/2017, 07/11/2017   Zoster, Live 10/19/2009    TDAP status: Up to date  Flu Vaccine  status: Due, Education has been provided regarding the importance of this vaccine. Advised may receive this vaccine at local pharmacy or Health Dept. Aware to provide a copy of the vaccination record if obtained from local pharmacy or Health Dept. Verbalized acceptance and understanding.  Pneumococcal vaccine status: Up to date  Covid-19 vaccine status: Completed vaccines  Qualifies for Shingles Vaccine? Yes   Zostavax completed No   Shingrix Completed?: Yes  Screening Tests Health Maintenance  Topic Date Due   INFLUENZA VACCINE  04/11/2023   MAMMOGRAM  05/27/2024   Medicare Annual Wellness (AWV)  06/02/2024   Colonoscopy  07/10/2027   DTaP/Tdap/Td (3 - Td or Tdap) 03/08/2031   Pneumonia Vaccine 9+ Years old  Completed   DEXA SCAN  Completed   Hepatitis C Screening  Completed   Zoster Vaccines- Shingrix  Completed   HPV VACCINES  Aged Out   COVID-19 Vaccine  Discontinued    Health Maintenance  Health Maintenance Due  Topic Date Due   INFLUENZA VACCINE  04/11/2023    Colorectal cancer screening: Type of screening: Colonoscopy. Completed 07/09/2022. Repeat every 5 years  Mammogram status: Completed 05/28/2023. Repeat every year  Bone Density status: Completed 05/22/2022. Results reflect: Bone density results: OSTEOPENIA. Repeat every n/a years.  Lung Cancer Screening: (Low Dose CT Chest recommended if Age 55-80 years, 20 pack-year currently smoking OR have quit w/in 15years.) does not qualify.   Lung Cancer Screening Referral: N/A  Additional Screening:  Hepatitis C Screening: does qualify; Completed 03/09/2019  Vision Screening: Recommended annual ophthalmology exams for early detection of glaucoma and other disorders of the eye. Is the patient up to date with their annual eye exam?  Yes  Who is the provider or what is the name of the office in which the patient attends annual eye exams? Dr. Blima Ledger If pt is not established with a provider, would they like to be  referred to a provider to establish care? No .   Dental Screening: Recommended annual dental exams for proper oral hygiene   Community Resource Referral / Chronic Care Management: CRR required this visit?  No   CCM required this visit?  No     Plan:     I have personally reviewed and noted the following in the patient's chart:   Medical and social history Use of alcohol, tobacco or illicit drugs  Current medications and supplements including opioid prescriptions. Patient is not currently taking opioid prescriptions. Functional ability and status Nutritional status Physical activity Advanced directives List of other physicians Hospitalizations, surgeries, and ER visits in previous 12 months Vitals Screenings to include cognitive, depression, and falls Referrals and appointments  In addition, I have reviewed and discussed with patient certain preventive protocols, quality metrics, and best practice recommendations. A written personalized care plan for preventive services as well as general preventive health recommendations were provided to patient.     Marinus Maw, CMA   06/03/2023   After Visit Summary: (MyChart) Due to this being a telephonic visit, the after visit summary with patients personalized plan was offered to patient via MyChart   Nurse Notes: N/A

## 2023-06-27 ENCOUNTER — Ambulatory Visit: Payer: Medicare PPO | Attending: Internal Medicine | Admitting: Internal Medicine

## 2023-06-27 ENCOUNTER — Encounter: Payer: Self-pay | Admitting: Internal Medicine

## 2023-06-27 VITALS — BP 124/72 | HR 63 | Resp 16 | Ht 63.0 in | Wt 180.4 lb

## 2023-06-27 DIAGNOSIS — I493 Ventricular premature depolarization: Secondary | ICD-10-CM | POA: Diagnosis not present

## 2023-06-27 DIAGNOSIS — E782 Mixed hyperlipidemia: Secondary | ICD-10-CM | POA: Diagnosis not present

## 2023-06-27 DIAGNOSIS — I341 Nonrheumatic mitral (valve) prolapse: Secondary | ICD-10-CM

## 2023-06-27 DIAGNOSIS — I272 Pulmonary hypertension, unspecified: Secondary | ICD-10-CM | POA: Diagnosis not present

## 2023-06-27 NOTE — Addendum Note (Signed)
Addended by: Macie Burows on: 06/27/2023 09:29 AM   Modules accepted: Orders

## 2023-06-27 NOTE — Patient Instructions (Signed)
Medication Instructions:  Your physician recommends that you continue on your current medications as directed. Please refer to the Current Medication list given to you today.  *If you need a refill on your cardiac medications before your next appointment, please call your pharmacy*   Lab Work: FLP, ALT, LPA  If you have labs (blood work) drawn today and your tests are completely normal, you will receive your results only by: MyChart Message (if you have MyChart) OR A paper copy in the mail If you have any lab test that is abnormal or we need to change your treatment, we will call you to review the results.   Testing/Procedures: JULY 2025- - Your physician has requested that you have an echocardiogram. Echocardiography is a painless test that uses sound waves to create images of your heart. It provides your doctor with information about the size and shape of your heart and how well your heart's chambers and valves are working. This procedure takes approximately one hour. There are no restrictions for this procedure. Please do NOT wear cologne, perfume, aftershave, or lotions (deodorant is allowed). Please arrive 15 minutes prior to your appointment time.    Follow-Up: At [facility], you and your health needs are our priority.  As part of our continuing mission to provide you with exceptional heart care, we have created designated Provider Care Teams.  These Care Teams include your primary Cardiologist (physician) and Advanced Practice Providers (APPs -  Physician Assistants and Nurse Practitioners) who all work together to provide you with the care you need, when you need it.     Your next appointment:   9 month(s)  Provider:   Christell Constant, MD

## 2023-06-27 NOTE — Progress Notes (Signed)
Cardiology Office Note:    Date:  06/27/2023   ID:  Madison Rowe, DOB 05-20-1951, MRN [id number]  PCP:  Etta Grandchild, MD  [facility] Cardiologist:  Riley Lam MD [facility] Electrophysiologist:  None   CC: Follow up PVCs  History of Present Illness:    Madison Rowe is a 72 y.o. female with a hx of HLD, Obesity, Possible Eczema who presents for evaluation 08/17/20.  2022:  Seen by Dr. 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   2023: Occasional PVCs, did not do 2023 Echo ordered by NP 2024: no PVCs on last visit  Discussed the use of AI scribe software for clinical note transcription with the patient, who gave verbal consent to proceed.   Madison Rowe, a 72 year old female with a history of hyperlipidemia, obesity, and mitral valve prolapse, has been asymptomatic and active, with recent travels to [facility]. She has been under my care due to her mitral valve prolapse, with the last echocardiogram conducted in July 2024. The most prominent feature noted was a posterior prolapse without MAD.  In November 2023, a calcium scoring was performed, revealing a calcium score of zero, leading to a conservative cholesterol goal of under 100. Her medication regimen includes low-dose atorvastatin (10mg ) and Lasix (20mg  daily).  She has a recent mammograph that noted breast arterial calcification.  She has been on atorvastatin for approximately five years, and there was a discussion about potentially increasing the dose from 10mg  to 20mg  to achieve a more aggressive cholesterol goal. The patient has been active and asymptomatic, with no reported chest pain or shortness of breath.    Past Medical History:  Diagnosis Date   Allergy    Anemia    Arthritis    Osteoporosis 2010   Vitamin D deficient osteomalacia 2010    Past Surgical History:  Procedure Laterality Date   BREAST SURGERY     COLONOSCOPY  07/09/2022   2018-polyp   ECTOPIC PREGNANCY SURGERY      FOOT SURGERY Left    laproscopy  1982   TONSILLECTOMY      Current Medications: Current Meds  Medication Sig   atorvastatin (LIPITOR) 10 MG tablet TAKE 1 TABLET(10 MG) BY MOUTH DAILY   Cholecalciferol 50 MCG (2000 UT) TABS Take 1 tablet (2,000 Units total) by mouth daily.   clobetasol cream (TEMOVATE) 0.05 % APPLY EXTERNALLY TO THE AFFECTED AREA TWICE DAILY (Patient taking differently: as needed (rash).)   Dermatological Products, Misc. (DERMEND BRUISE FORMULA) LOTN as needed. bruising   Dietary Management Product (FOSTEUM PLUS) CAPS Take 1 capsule by mouth 2 (two) times daily.   estradiol (ESTRACE) 0.1 MG/GM vaginal cream SMARTSIG:Gram(s) Topical 3 Times a Week   fluticasone (FLONASE) 50 MCG/ACT nasal spray Place 2 sprays into both nostrils daily.   furosemide (LASIX) 20 MG tablet TAKE 1 TABLET(20 MG) BY MOUTH DAILY   loratadine (CLARITIN) 10 MG tablet Take 10 mg by mouth daily.   meloxicam (MOBIC) 7.5 MG tablet TAKE 1 TABLET(7.5 MG) BY MOUTH EVERY OTHER DAY (Patient taking differently: Take 7.5 mg by mouth as needed for pain.)     Allergies:   Alendronate sodium [alendronate sodium], Olmesartan, and Codeine   Social History   Socioeconomic History   Marital status: Married    Spouse name: Not on file   Number of children: Not on file   Years of education: Not on file   Highest education level: Master's degree (e.g., MA, MS, MEng,  MEd, MSW, MBA)  Occupational History   Not on file  Tobacco Use   Smoking status: Never   Smokeless tobacco: Never  Vaping Use   Vaping status: Never Used  Substance and Sexual Activity   Alcohol use: Yes    Comment: Occasional   Drug use: No   Sexual activity: Yes    Birth control/protection: Surgical  Other Topics Concern   Not on file  Social History Narrative   Not on file   Social Determinants of Health   Financial Resource Strain: Low Risk  (06/03/2023)   Overall Financial Resource Strain (CARDIA)    Difficulty of Paying Living  Expenses: Not hard at all  Food Insecurity: No Food Insecurity (06/03/2023)   Hunger Vital Sign    Worried About Running Out of Food in the Last Year: Never true    Ran Out of Food in the Last Year: Never true  Transportation Needs: No Transportation Needs (06/03/2023)   PRAPARE - Administrator, Civil Service (Medical): No    Lack of Transportation (Non-Medical): No  Physical Activity: Sufficiently Active (06/03/2023)   Exercise Vital Sign    Days of Exercise per Week: 5 days    Minutes of Exercise per Session: 60 min  Stress: No Stress Concern Present (06/03/2023)   [employer] of Occupational Health - Occupational Stress Questionnaire    Feeling of Stress : Not at all  Social Connections: Socially Integrated (06/03/2023)   Social Connection and Isolation Panel [NHANES]    Frequency of Communication with Friends and Family: More than three times a week    Frequency of Social Gatherings with Friends and Family: Three times a week    Attends Religious Services: More than 4 times per year    Active Member of Clubs or Organizations: Yes    Attends Engineer, structural: More than 4 times per year    Marital Status: Married    Social:  Found out they are going to be grandparents again, frequent travelers  Family History: The patient's family history includes Cancer in her sister; Hypertension in her mother; Kidney disease in her father and maternal grandfather. There is no history of Heart disease, Colon cancer, Esophageal cancer, Rectal cancer, Stomach cancer, or Colon polyps. Mother had mitral valve prolapse and brother has atrial fibrillation  ROS:   Please see the history of present illness.     EKGs/Labs/Other Studies Reviewed:    The following studies were reviewed today:  EKG:  04/02/22 SR RBBB no PVCs 08/17/20: SR rate 70, RAE, RBBB  Cardiac Studies & Procedures       ECHOCARDIOGRAM  ECHOCARDIOGRAM COMPLETE 03/26/2023  Narrative ECHOCARDIOGRAM  REPORT    Patient Name:   Madison Rowe Date of Exam: 03/26/2023 Medical Rec #:  [id number]       Height:       63.0 in Accession #:    [id number]      Weight:       181.0 lb Date of Birth:  01-Mar-1951        BSA:          1.853 m Patient Age:    72 years        BP:           113/77 mmHg Patient Gender: F               HR:           67 bpm. Exam Location:  [facility] [employer] [facility] [employer]  [address]  Procedure: 2D Echo, 3D Echo, Cardiac Doppler and Color Doppler  Indications:    I34.0 Mitral regurgitation  History:        Patient has prior history of Echocardiogram examinations, most recent 06/08/2020. Pulmonary HTN, Arrythmias:PVC; Risk Factors:HLD.  Sonographer:    Clearence Ped RCS Referring Phys: [id number] Voshon Petro A Roux Brandy  IMPRESSIONS   1. Left ventricular ejection fraction, by estimation, is 55 to 60%. Left ventricular ejection fraction by 3D volume is 57 %. The left ventricle has normal function. The left ventricle has no regional wall motion abnormalities. There is moderate concentric left ventricular hypertrophy. Left ventricular diastolic parameters are consistent with Grade I diastolic dysfunction (impaired relaxation). 2. Right ventricular systolic function is normal. The right ventricular size is normal. There is normal pulmonary artery systolic pressure. The estimated right ventricular systolic pressure is 27.8 mmHg. 3. The mitral valve is normal in structure. Mild to moderate mitral valve regurgitation. No evidence of mitral stenosis. 4. The aortic valve is normal in structure. Aortic valve regurgitation is not visualized. No aortic stenosis is present. 5. The inferior vena cava is normal in size with greater than 50% respiratory variability, suggesting right atrial pressure of 3 mmHg.  FINDINGS Left Ventricle: Left ventricular ejection fraction, by estimation, is 55 to 60%. Left ventricular ejection fraction by 3D volume is 57 %. The left ventricle has normal function. The left ventricle has no  regional wall motion abnormalities. The left ventricular internal cavity size was normal in size. There is moderate concentric left ventricular hypertrophy. Left ventricular diastolic parameters are consistent with Grade I diastolic dysfunction (impaired relaxation). Normal left ventricular filling pressure.  Right Ventricle: The right ventricular size is normal. No increase in right ventricular wall thickness. Right ventricular systolic function is normal. There is normal pulmonary artery systolic pressure. The tricuspid regurgitant velocity is 2.49 m/s, and with an assumed right atrial pressure of 3 mmHg, the estimated right ventricular systolic pressure is 27.8 mmHg.  Left Atrium: Left atrial size was normal in size.  Right Atrium: Right atrial size was normal in size.  Pericardium: Trivial pericardial effusion is present. The pericardial effusion is posterior to the left ventricle.  Mitral Valve: The mitral valve is normal in structure. Mild to moderate mitral valve regurgitation. No evidence of mitral valve stenosis.  Tricuspid Valve: The tricuspid valve is normal in structure. Tricuspid valve regurgitation is mild . No evidence of tricuspid stenosis.  Aortic Valve: The aortic valve is normal in structure. Aortic valve regurgitation is not visualized. No aortic stenosis is present.  Pulmonic Valve: The pulmonic valve was normal in structure. Pulmonic valve regurgitation is mild. No evidence of pulmonic stenosis.  Aorta: The aortic root is normal in size and structure.  Venous: The inferior vena cava is normal in size with greater than 50% respiratory variability, suggesting right atrial pressure of 3 mmHg.  IAS/Shunts: No atrial level shunt detected by color flow Doppler.   LEFT VENTRICLE PLAX 2D LVIDd:         4.10 cm         Diastology LVIDs:         2.60 cm         LV e' medial:    5.77 cm/s LV PW:         1.20 cm         LV E/e' medial:  8.3 LV IVS:        1.30 cm         LV  e' lateral:   8.54 cm/s LVOT diam:     2.00 cm         LV E/e' lateral: 5.6 LV SV:         69 LV SV Index:   37 LVOT Area:     3.14 cm        3D Volume EF LV 3D EF:    Left ventricul ar ejection fraction by 3D volume is 57 %.  3D Volume EF: 3D EF:        57 % LV EDV:       137 ml LV ESV:       59 ml LV SV:        78 ml  RIGHT VENTRICLE RV Basal diam:  2.40 cm RV S prime:     11.50 cm/s TAPSE (M-mode): 1.9 cm RVSP:           27.8 mmHg  LEFT ATRIUM             Index        RIGHT ATRIUM           Index LA diam:        3.50 cm 1.89 cm/m   RA Pressure: 3.00 mmHg LA Vol (A2C):   55.4 ml 29.89 ml/m  RA Area:     12.70 cm LA Vol (A4C):   47.2 ml 25.47 ml/m  RA Volume:   27.80 ml  15.00 ml/m LA Biplane Vol: 51.4 ml 27.73 ml/m AORTIC VALVE LVOT Vmax:   101.00 cm/s LVOT Vmean:  72.100 cm/s LVOT VTI:    0.220 m  AORTA Ao Root diam: 3.70 cm Ao Asc diam:  3.50 cm  MITRAL VALVE                  TRICUSPID VALVE MV Area (PHT):                TR Peak grad:   24.8 mmHg MV Decel Time:                TR Vmax:        249.00 cm/s MR Peak grad:    32.5 mmHg    Estimated RAP:  3.00 mmHg MR Mean grad:    21.0 mmHg    RVSP:           27.8 mmHg MR Vmax:         285.00 cm/s MR Vmean:        210.0 cm/s   SHUNTS MR PISA:         3.08 cm     Systemic VTI:  0.22 m MR PISA Eff ROA: 33 mm       Systemic Diam: 2.00 cm MR PISA Radius:  0.70 cm MV E velocity: 47.70 cm/s MV A velocity: 71.30 cm/s MV E/A ratio:  0.67  Madison Manchester Turner MD Electronically signed by Armanda Magic MD Signature Date/Time: 03/26/2023/12:51:39 PM    Final    MONITORS  LONG TERM MONITOR (3-14 DAYS) 12/15/2021  Narrative  Patient had a minimum heart rate of 47 bpm, maximum heart rate of 150 bpm, and average heart rate of 79 bpm.  Predominant underlying rhythm was sinus rhythm.  Nine runs of SVT occurred lasting 10 seconds at longest with a max rate of 150 bpm at fastest.  Isolated PACs were rare (<1.0%).   Isolated PVCs were occasional (4.2%).  Triggered and diary events associated with sinus rhythm, PVCs, and PACs.  Occasional PVCs.  CT SCANS  CT CARDIAC SCORING (SELF PAY ONLY) 05/22/2022  Addendum 05/23/2022  9:41 AM ADDENDUM REPORT: 05/23/2022 09:38  ADDENDUM: The following report is an over-read performed by radiologist Dr. Maudry Mayhew of [facility] on May 23, 2022. This over-read does not include interpretation of cardiac or coronary anatomy or pathology. The coronary calcium score interpretation by the cardiologist is attached.  COMPARISON:  None.  FINDINGS: Vascular: No acute non-cardiac vascular finding.  Mediastinum/Nodes: Within the visualized portions of the chest there are no pathologically enlarged mediastinal, or hilar lymph nodes, noting limited sensitivity for the detection of hilar adenopathy on this noncontrast study. Small hiatal hernia.  Lungs/Pleura: Clustered nodularity in the right-greater-than-left posterior upper lobes for instance on image 11/604 measuring up to 2 mm. Within the visualized portions of the thorax there is no pleural effusions and no pneumothorax  Upper Abdomen: Visualized portions of the upper abdomen are unremarkable.  Musculoskeletal: No acute osseous abnormality no acute osseous abnormality.  IMPRESSION: 1. Clustered nodularity in the posterior right-greater-than-left lower lobes measuring up to 2 mm likely infectious or inflammatory. Per Fleischner Society Guidelines, if patient is low risk for malignancy, no routine follow-up imaging is recommended; if patient is high risk for malignancy, a non-contrast Chest CT at 12 months is optional. If performed and the nodule is stable at 12 months, no further follow-up is recommended. These guidelines do not apply to immunocompromised patients and patients with cancer. Follow up in patients with significant comorbidities as clinically warranted. For lung cancer  screening, adhere to Lung-RADS guidelines. Reference: Radiology. 2017; 284(1):228-43. 2. Small hiatal hernia.   Electronically Signed By: Maudry Mayhew M.D. On: 05/23/2022 09:38  Narrative CLINICAL DATA:  Cardiovascular Disease Risk stratification  EXAM: Coronary Calcium Score  TECHNIQUE: A gated, non-contrast computed tomography scan of the heart was performed using 3mm slice thickness. Axial images were analyzed on a dedicated workstation. Calcium scoring of the coronary arteries was performed using the Agatston method.  FINDINGS: Coronary Calcium Score:  Left main: 0  Left anterior descending artery: 0  Left circumflex artery: 0  Right coronary artery: 0  Total: 0  Percentile: 0  Pericardium: Normal.  Ascending Aorta: Normal caliber.  Non-cardiac: See separate report from [facility].  IMPRESSION: Coronary calcium score of 0. This was 0 percentile for age-, race-, and sex-matched controls.  RECOMMENDATIONS: Coronary artery calcium (CAC) score is a strong predictor of incident coronary heart disease (CHD) and provides predictive information beyond traditional risk factors. CAC scoring is reasonable to use in the decision to withhold, postpone, or initiate statin therapy in intermediate-risk or selected borderline-risk asymptomatic adults (age 74-75 years and LDL-C >=70 to <190 mg/dL) who do not have diabetes or established atherosclerotic cardiovascular disease (ASCVD).* In intermediate-risk (10-year ASCVD risk >=7.5% to <20%) adults or selected borderline-risk (10-year ASCVD risk >=5% to <7.5%) adults in whom a CAC score is measured for the purpose of making a treatment decision the following recommendations have been made:  If CAC=0, it is reasonable to withhold statin therapy and reassess in 5 to 10 years, as long as higher risk conditions are absent (diabetes mellitus, family history of premature CHD in first degree relatives (males <55  years; females <65 years), cigarette smoking, or LDL >=190 mg/dL).  If CAC is 1 to 99, it is reasonable to initiate statin therapy for patients >=22 years of age.  If CAC is >=100 or >=75th percentile, it is reasonable to initiate statin therapy at any age.  Cardiology referral  should be considered for patients with CAC scores >=400 or >=75th percentile.  *2018 AHA/ACC/AACVPR/AAPA/ABC/ACPM/ADA/AGS/APhA/ASPC/NLA/PCNA Guideline on the Management of Blood Cholesterol: A Report of the American College of Cardiology/American Heart Association Task Force on Clinical Practice Guidelines. J Am Coll Cardiol. 2019;73(24):3168-3209.  Olga Millers, MD  Electronically Signed: By: Olga Millers M.D. On: 05/22/2022 17:17           Recent Labs: 01/24/2023: BUN 15; Creatinine, Ser 0.81; Hemoglobin 12.7; Platelets 193.0; Potassium 3.8; Sodium 139; TSH 1.68  Recent Lipid Panel    Component Value Date/Time   CHOL 161 06/07/2022 0951   TRIG 53.0 06/07/2022 0951   HDL 70.20 06/07/2022 0951   CHOLHDL 2 06/07/2022 0951   VLDL 10.6 06/07/2022 0951   LDLCALC 80 06/07/2022 0951   LDLCALC 77 04/19/2020 1103   LDLDIRECT 122.7 09/27/2011 1123     Physical Exam:    VS:  BP 124/72 (BP Location: Right Arm, Patient Position: Sitting, Cuff Size: Large)   Pulse 63   Resp 16   Ht 5\' 3"  (1.6 m)   Wt 180 lb 6.4 oz (81.8 kg)   SpO2 94%   BMI 31.96 kg/m     Wt Readings from Last 3 Encounters:  06/27/23 180 lb 6.4 oz (81.8 kg)  06/03/23 181 lb (82.1 kg)  01/24/23 181 lb (82.1 kg)    Gen: No distress  Neck: No JVD Cardiac: No Rubs or Gallops, soft systolic murmur RRR +2 radial pulses Respiratory: Clear to auscultation bilaterally, normal effort, normal  respiratory rate GI: Soft, nontender, non-distended  MS: No  edema; moves all extremities Integument: Skin feels warm Neuro:  At time of evaluation, alert and oriented to person/place/time/situation  Psych: Normal affect, patient feels well     ASSESSMENT:    1. MVP (mitral valve prolapse)   2. Pulmonary hypertension, unspecified (HCC)   3. PVC (premature ventricular contraction)   4. Mixed hyperlipidemia     PLAN:    Mitral Regurgitation Mild to Moderate related to MVP without clear MAD Pulmonary Hypertension - WHO Functional Class I, Stage A-B, euvolemic, WHO II in the setting of MR - Diuretic regimen: continue 20 mg PO lasix - echo in July then f/u  Occasional PVCs - asymptomatic and non on EKG today - if further issues, will start low dose AV nodal agent   HLD  Breast arterial calcification  Aortic atherosclerosis (visually but without confirmation on HS per rads read) - we discussed aggressive prevention goals, LDL < 70 to start - continue current statin - will get fasting lipids, LAT, and Lp(a)  Will get 03/2024 f/u post echo  Medication Adjustments/Labs and Tests Ordered: Current medicines are reviewed at length with the patient today.  Concerns regarding medicines are outlined above.  Orders Placed This Encounter  Procedures   EKG 12-Lead   No orders of the defined types were placed in this encounter.   There are no Patient Instructions on file for this visit.   Signed, Christell Constant, MD  06/27/2023 9:05 AM    [facility]

## 2023-06-28 LAB — LIPID PANEL
Chol/HDL Ratio: 2.2 {ratio} (ref 0.0–4.4)
Cholesterol, Total: 176 mg/dL (ref 100–199)
HDL: 79 mg/dL (ref >39)
LDL Chol Calc (NIH): 86 mg/dL (ref 0–99)
Triglycerides: 58 mg/dL (ref 0–149)
VLDL Cholesterol Cal: 11 mg/dL (ref 5–40)

## 2023-06-28 LAB — LIPOPROTEIN A (LPA): Lipoprotein (a): 90.6 nmol/L — ABNORMAL HIGH (ref <75.0)

## 2023-06-28 LAB — ALT: ALT: 20 [IU]/L (ref 0–32)

## 2023-07-01 ENCOUNTER — Telehealth: Payer: Self-pay

## 2023-07-01 DIAGNOSIS — E782 Mixed hyperlipidemia: Secondary | ICD-10-CM

## 2023-07-01 MED ORDER — ATORVASTATIN CALCIUM 20 MG PO TABS: 20.0000 mg | ORAL_TABLET | Freq: Every day | ORAL | 3 refills | Status: DC

## 2023-07-01 NOTE — Telephone Encounter (Signed)
-----   Message from Christell Constant sent at 06/29/2023  1:19 PM EDT ----- Results: Lp(a) elevated, LDL 86 Plan: Double atorvastatin dose as discussed and get follow up labs in 3 months  Christell Constant, MD

## 2023-07-01 NOTE — Telephone Encounter (Signed)
The patient has been notified of the result and verbalized understanding.  All questions (if any) were answered. Macie Burows, RN 07/01/2023 12:49 PM   Pt will come in for f/u lab work on 09/24/22.

## 2023-07-10 DIAGNOSIS — H5203 Hypermetropia, bilateral: Secondary | ICD-10-CM | POA: Diagnosis not present

## 2023-07-10 DIAGNOSIS — H18503 Unspecified hereditary corneal dystrophies, bilateral: Secondary | ICD-10-CM | POA: Diagnosis not present

## 2023-07-10 DIAGNOSIS — D3131 Benign neoplasm of right choroid: Secondary | ICD-10-CM | POA: Diagnosis not present

## 2023-07-10 DIAGNOSIS — H2513 Age-related nuclear cataract, bilateral: Secondary | ICD-10-CM | POA: Diagnosis not present

## 2023-07-10 DIAGNOSIS — H524 Presbyopia: Secondary | ICD-10-CM | POA: Diagnosis not present

## 2023-07-10 DIAGNOSIS — H53143 Visual discomfort, bilateral: Secondary | ICD-10-CM | POA: Diagnosis not present

## 2023-07-10 DIAGNOSIS — H52223 Regular astigmatism, bilateral: Secondary | ICD-10-CM | POA: Diagnosis not present

## 2023-07-30 ENCOUNTER — Other Ambulatory Visit: Payer: Self-pay | Admitting: Internal Medicine

## 2023-07-30 DIAGNOSIS — M722 Plantar fascial fibromatosis: Secondary | ICD-10-CM

## 2023-07-31 ENCOUNTER — Encounter: Payer: Self-pay | Admitting: Internal Medicine

## 2023-08-01 ENCOUNTER — Encounter: Payer: Self-pay | Admitting: Internal Medicine

## 2023-08-01 ENCOUNTER — Ambulatory Visit: Payer: Medicare PPO | Admitting: Internal Medicine

## 2023-08-01 VITALS — BP 122/74 | HR 74 | Temp 97.6°F | Ht 63.0 in | Wt 189.6 lb

## 2023-08-01 DIAGNOSIS — N1831 Chronic kidney disease, stage 3a: Secondary | ICD-10-CM

## 2023-08-01 DIAGNOSIS — M1991 Primary osteoarthritis, unspecified site: Secondary | ICD-10-CM | POA: Diagnosis not present

## 2023-08-01 DIAGNOSIS — I10 Essential (primary) hypertension: Secondary | ICD-10-CM

## 2023-08-01 DIAGNOSIS — E559 Vitamin D deficiency, unspecified: Secondary | ICD-10-CM

## 2023-08-01 DIAGNOSIS — Z23 Encounter for immunization: Secondary | ICD-10-CM

## 2023-08-01 DIAGNOSIS — E785 Hyperlipidemia, unspecified: Secondary | ICD-10-CM

## 2023-08-01 DIAGNOSIS — M810 Age-related osteoporosis without current pathological fracture: Secondary | ICD-10-CM | POA: Diagnosis not present

## 2023-08-01 DIAGNOSIS — M722 Plantar fascial fibromatosis: Secondary | ICD-10-CM

## 2023-08-01 LAB — CBC WITH DIFFERENTIAL/PLATELET
Basophils Absolute: 0.1 10*3/uL (ref 0.0–0.1)
Basophils Relative: 0.7 % (ref 0.0–3.0)
Eosinophils Absolute: 0.1 10*3/uL (ref 0.0–0.7)
Eosinophils Relative: 1.2 % (ref 0.0–5.0)
HCT: 38.6 % (ref 36.0–46.0)
Hemoglobin: 12.8 g/dL (ref 12.0–15.0)
Lymphocytes Relative: 28.6 % (ref 12.0–46.0)
Lymphs Abs: 2.3 10*3/uL (ref 0.7–4.0)
MCHC: 33.1 g/dL (ref 30.0–36.0)
MCV: 88.9 fL (ref 78.0–100.0)
Monocytes Absolute: 0.7 10*3/uL (ref 0.1–1.0)
Monocytes Relative: 9.1 % (ref 3.0–12.0)
Neutro Abs: 4.8 10*3/uL (ref 1.4–7.7)
Neutrophils Relative %: 60.4 % (ref 43.0–77.0)
Platelets: 210 10*3/uL (ref 150.0–400.0)
RBC: 4.35 Mil/uL (ref 3.87–5.11)
RDW: 14.2 % (ref 11.5–15.5)
WBC: 8 10*3/uL (ref 4.0–10.5)

## 2023-08-01 LAB — BASIC METABOLIC PANEL
BUN: 20 mg/dL (ref 6–23)
CO2: 31 meq/L (ref 19–32)
Calcium: 9.9 mg/dL (ref 8.4–10.5)
Chloride: 99 meq/L (ref 96–112)
Creatinine, Ser: 0.86 mg/dL (ref 0.40–1.20)
GFR: 66.42 mL/min (ref >60.00)
Glucose, Bld: 83 mg/dL (ref 70–99)
Potassium: 4 meq/L (ref 3.5–5.1)
Sodium: 138 meq/L (ref 135–145)

## 2023-08-01 LAB — PHOSPHORUS: Phosphorus: 4.4 mg/dL (ref 2.3–4.6)

## 2023-08-01 LAB — CK: Total CK: 126 U/L (ref 7–177)

## 2023-08-01 NOTE — Patient Instructions (Signed)
Furosemide Tablets What is this medication? FUROSEMIDE (fyoor [place] se mide) treats high blood pressure. It may also be used to reduce swelling related to heart, kidney, or liver disease. It helps your kidneys remove more fluid and salt from your blood through the urine. It belongs to a group of medications called diuretics. This medicine may be used for other purposes; ask your health care provider or pharmacist if you have questions. COMMON BRAND NAME(S): Active-Medicated Specimen Kit, Delone, Diuscreen, Lasix, RX Specimen Collection Kit, Specimen Collection Kit, URINX Medicated Specimen Collection What should I tell my care team before I take this medication? They need to know if you have any of these conditions: Diarrhea or vomiting Gout Heart disease High or low levels of electrolytes, such as magnesium, potassium, or sodium in your blood Kidney disease, small amounts of urine, or difficulty passing urine Liver disease Thyroid disease An unusual or allergic reaction to furosemide, sulfa medications, other medications, foods, dyes, or preservatives Pregnant or trying to get pregnant Breast-feeding How should I use this medication? Take this medication by mouth. Take it as directed on the prescription label at the same time every day. You can take it with or without food. If it upsets your stomach, take it with food. Keep taking it unless your care team tells you to stop. Talk to your care team about the use of this medication in children. Special care may be needed. Overdosage: If you think you have taken too much of this medicine contact a poison control center or emergency room at once. NOTE: This medicine is only for you. Do not share this medicine with others. What if I miss a dose? If you miss a dose, take it as soon as you can. If it is almost time for your next dose, take only that dose. Do not take double or extra doses. What may interact with this medication? Aspirin and  aspirin-like medications Certain antibiotics Chloral hydrate Cisplatin Cyclosporine Digoxin Diuretics Laxatives Lithium Medications for blood pressure Medications that relax muscles for surgery Methotrexate NSAIDs, medications for pain and inflammation, such as ibuprofen, naproxen, or indomethacin Phenytoin Steroid medications, such as prednisone or cortisone Sucralfate Thyroid hormones This list may not describe all possible interactions. Give your health care provider a list of all the medicines, herbs, non-prescription drugs, or dietary supplements you use. Also tell them if you smoke, drink alcohol, or use illegal drugs. Some items may interact with your medicine. What should I watch for while using this medication? Visit your care team for regular checks on your progress. Tell your care team if your symptoms do not start to get better or if they get worse. Check your blood pressure as directed. Know what your blood pressure should be and when to contact your care team. This medication may increase the amount of sugar in blood or urine. The risk may be higher in patients who already have diabetes. Ask your care team what you can do to lower your risk of diabetes while taking this medication. You may need to be on a special diet while taking this medication. Check with your care team. Also, ask how many glasses of fluid you need to drink a day. You must not get dehydrated. This medication may affect your coordination, reaction time, or judgment. Do not drive or operate machinery until you know how this medication affects you. Sit up or stand slowly to reduce the risk of dizzy or fainting spells. Drinking alcohol with this medication can increase the risk  of these side effects. This medication can make you more sensitive to the sun. Keep out of the sun. If you cannot avoid being in the sun, wear protective clothing and use sunscreen. Do not use sun lamps or tanning beds/booths. Check with  your care team if you have severe diarrhea, nausea, and vomiting, or if you sweat a lot. The loss of too much body fluid may make it dangerous for you to take this medication. What side effects may I notice from receiving this medication? Side effects that you should report to your care team as soon as possible: Allergic reactions--skin rash, itching, hives, swelling of the face, lips, tongue, or throat Dehydration--increased thirst, dry mouth, feeling faint or lightheaded, headache, dark yellow or brown urine Hearing loss, ringing in ears High blood sugar (hyperglycemia)--increased thirst or amount of urine, unusual weakness or fatigue, blurry vision Low blood pressure--dizziness, feeling faint or lightheaded, blurry vision Low potassium level--muscle pain or cramps, unusual weakness or fatigue, fast or irregular heartbeat, constipation Side effects that usually do not require medical attention (report to your care team if they continue or are bothersome): Burning or tingling sensation in hands or feet Constipation Diarrhea Dizziness Headache This list may not describe all possible side effects. Call your doctor for medical advice about side effects. You may report side effects to FDA at 1-800-FDA-1088. Where should I keep my medication? Keep out of the reach of children and pets. Store at room temperature between 20 and 25 degrees C (68 and 77 degrees F). Protect from light and moisture. Keep the container tightly closed. Throw away any unused medication after the expiration date. NOTE: This sheet is a summary. It may not cover all possible information. If you have questions about this medicine, talk to your doctor, pharmacist, or health care provider.  2024 Elsevier/Gold Standard (2021-07-28 00:00:00)

## 2023-08-01 NOTE — Progress Notes (Signed)
Subjective:  Patient ID: Madison Rowe, female    DOB: 10-05-1950  Age: 72 y.o. MRN: [id number]  CC: Osteoarthritis and Hyperlipidemia   HPI SHIRELY BIALECKI presents for f/up ----  Discussed the use of AI scribe software for clinical note transcription with the patient, who gave verbal consent to proceed.  History of Present Illness   The patient, with a history of pulmonary hypertension, thyroid nodule, and hypercholesterolemia, reports no cardiac or thyroid symptoms. She denies chest pain, shortness of breath, constipation, diarrhea, weight changes, swelling, and fatigue. She attributes her symptom-free status to the use of furosemide.  The patient's cholesterol medication was recently increased from 10 to 20mg , and she reports no new muscle aches, a common side effect of statins. She continues to take meloxicam for joint pain.       Outpatient Medications Prior to Visit  Medication Sig Dispense Refill   atorvastatin (LIPITOR) 20 MG tablet Take 1 tablet (20 mg total) by mouth daily. 90 tablet 3   Cholecalciferol 50 MCG (2000 UT) TABS Take 1 tablet (2,000 Units total) by mouth daily. 90 tablet 1   clobetasol cream (TEMOVATE) 0.05 % APPLY EXTERNALLY TO THE AFFECTED AREA TWICE DAILY (Patient taking differently: as needed (rash).) 60 g 1   Dermatological Products, Misc. (DERMEND BRUISE FORMULA) LOTN as needed. bruising     Dietary Management Product (FOSTEUM PLUS) CAPS Take 1 capsule by mouth 2 (two) times daily. 180 capsule 1   estradiol (ESTRACE) 0.1 MG/GM vaginal cream SMARTSIG:Gram(s) Topical 3 Times a Week     fluticasone (FLONASE) 50 MCG/ACT nasal spray Place 2 sprays into both nostrils daily.     furosemide (LASIX) 20 MG tablet TAKE 1 TABLET(20 MG) BY MOUTH DAILY 90 tablet 0   loratadine (CLARITIN) 10 MG tablet Take 10 mg by mouth daily.     meloxicam (MOBIC) 7.5 MG tablet TAKE 1 TABLET(7.5 MG) BY MOUTH EVERY OTHER DAY (Patient taking differently: Take 7.5 mg by mouth as needed  for pain.) 45 tablet 0   No facility-administered medications prior to visit.    ROS Review of Systems  Constitutional: Negative.  Negative for appetite change, fatigue and unexpected weight change.  HENT: Negative.    Eyes: Negative.   Respiratory:  Negative for cough, chest tightness, shortness of breath and wheezing.   Cardiovascular:  Negative for chest pain, palpitations and leg swelling.  Gastrointestinal:  Negative for abdominal pain, constipation, diarrhea and vomiting.  Endocrine: Negative.   Genitourinary: Negative.  Negative for difficulty urinating.  Musculoskeletal:  Positive for arthralgias. Negative for myalgias.  Neurological: Negative.  Negative for dizziness and weakness.  Hematological:  Negative for adenopathy. Does not bruise/bleed easily.  Psychiatric/Behavioral: Negative.      Objective:  BP 122/74   Pulse 74   Temp 97.6 F (36.4 C) (Oral)   Ht 5\' 3"  (1.6 m)   Wt 189 lb 9.6 oz (86 kg)   SpO2 93%   BMI 33.59 kg/m   BP Readings from Last 3 Encounters:  08/01/23 122/74  06/27/23 124/72  06/03/23 118/78    Wt Readings from Last 3 Encounters:  08/01/23 189 lb 9.6 oz (86 kg)  06/27/23 180 lb 6.4 oz (81.8 kg)  06/03/23 181 lb (82.1 kg)    Physical Exam Vitals reviewed.  Constitutional:      Appearance: Normal appearance.  HENT:     Mouth/Throat:     Mouth: Mucous membranes are moist.  Eyes:     General: No scleral  icterus.    Conjunctiva/sclera: Conjunctivae normal.  Neck:     Thyroid: No thyroid mass, thyromegaly or thyroid tenderness.  Cardiovascular:     Rate and Rhythm: Normal rate and regular rhythm.     Heart sounds: No murmur heard. Pulmonary:     Effort: Pulmonary effort is normal.     Breath sounds: No stridor. No wheezing, rhonchi or rales.  Abdominal:     General: Abdomen is flat.     Palpations: There is no mass.     Tenderness: There is no abdominal tenderness. There is no guarding.     Hernia: No hernia is present.   Musculoskeletal:        General: Normal range of motion.     Cervical back: Neck supple.     Right lower leg: No edema.     Left lower leg: No edema.  Lymphadenopathy:     Cervical: No cervical adenopathy.  Skin:    General: Skin is warm and dry.  Neurological:     General: No focal deficit present.     Mental Status: She is alert. Mental status is at baseline.  Psychiatric:        Mood and Affect: Mood normal.        Behavior: Behavior normal.     Lab Results  Component Value Date   WBC 8.0 08/01/2023   HGB 12.8 08/01/2023   HCT 38.6 08/01/2023   PLT 210.0 08/01/2023   GLUCOSE 83 08/01/2023   CHOL 176 06/27/2023   TRIG 58 06/27/2023   HDL 79 06/27/2023   LDLDIRECT 122.7 09/27/2011   LDLCALC 86 06/27/2023   ALT 20 06/27/2023   AST 17 06/07/2022   NA 138 08/01/2023   K 4.0 08/01/2023   CL 99 08/01/2023   CREATININE 0.86 08/01/2023   BUN 20 08/01/2023   CO2 31 08/01/2023   TSH 1.68 01/24/2023    CT CARDIAC SCORING (SELF PAY ONLY)  Addendum Date: 05/23/2022   ADDENDUM REPORT: 05/23/2022 09:38 ADDENDUM: The following report is an over-read performed by radiologist Dr. Maudry Mayhew of [facility] on May 23, 2022. This over-read does not include interpretation of cardiac or coronary anatomy or pathology. The coronary calcium score interpretation by the cardiologist is attached. COMPARISON:  None. FINDINGS: Vascular: No acute non-cardiac vascular finding. Mediastinum/Nodes: Within the visualized portions of the chest there are no pathologically enlarged mediastinal, or hilar lymph nodes, noting limited sensitivity for the detection of hilar adenopathy on this noncontrast study. Small hiatal hernia. Lungs/Pleura: Clustered nodularity in the right-greater-than-left posterior upper lobes for instance on image 11/604 measuring up to 2 mm. Within the visualized portions of the thorax there is no pleural effusions and no pneumothorax Upper Abdomen: Visualized  portions of the upper abdomen are unremarkable. Musculoskeletal: No acute osseous abnormality no acute osseous abnormality. IMPRESSION: 1. Clustered nodularity in the posterior right-greater-than-left lower lobes measuring up to 2 mm likely infectious or inflammatory. Per Fleischner Society Guidelines, if patient is low risk for malignancy, no routine follow-up imaging is recommended; if patient is high risk for malignancy, a non-contrast Chest CT at 12 months is optional. If performed and the nodule is stable at 12 months, no further follow-up is recommended. These guidelines do not apply to immunocompromised patients and patients with cancer. Follow up in patients with significant comorbidities as clinically warranted. For lung cancer screening, adhere to Lung-RADS guidelines. Reference: Radiology. 2017; 284(1):228-43. 2. Small hiatal hernia. Electronically Signed   By: Christell Constant.D.  On: 05/23/2022 09:38   Result Date: 05/23/2022 CLINICAL DATA:  Cardiovascular Disease Risk stratification EXAM: Coronary Calcium Score TECHNIQUE: A gated, non-contrast computed tomography scan of the heart was performed using 3mm slice thickness. Axial images were analyzed on a dedicated workstation. Calcium scoring of the coronary arteries was performed using the Agatston method. FINDINGS: Coronary Calcium Score: Left main: 0 Left anterior descending artery: 0 Left circumflex artery: 0 Right coronary artery: 0 Total: 0 Percentile: 0 Pericardium: Normal. Ascending Aorta: Normal caliber. Non-cardiac: See separate report from [facility]. IMPRESSION: Coronary calcium score of 0. This was 0 percentile for age-, race-, and sex-matched controls. RECOMMENDATIONS: Coronary artery calcium (CAC) score is a strong predictor of incident coronary heart disease (CHD) and provides predictive information beyond traditional risk factors. CAC scoring is reasonable to use in the decision to withhold, postpone, or initiate statin  therapy in intermediate-risk or selected borderline-risk asymptomatic adults (age 36-75 years and LDL-C >=70 to <190 mg/dL) who do not have diabetes or established atherosclerotic cardiovascular disease (ASCVD).* In intermediate-risk (10-year ASCVD risk >=7.5% to <20%) adults or selected borderline-risk (10-year ASCVD risk >=5% to <7.5%) adults in whom a CAC score is measured for the purpose of making a treatment decision the following recommendations have been made: If CAC=0, it is reasonable to withhold statin therapy and reassess in 5 to 10 years, as long as higher risk conditions are absent (diabetes mellitus, family history of premature CHD in first degree relatives (males <55 years; females <65 years), cigarette smoking, or LDL >=190 mg/dL). If CAC is 1 to 99, it is reasonable to initiate statin therapy for patients >=57 years of age. If CAC is >=100 or >=75th percentile, it is reasonable to initiate statin therapy at any age. Cardiology referral should be considered for patients with CAC scores >=400 or >=75th percentile. *2018 AHA/ACC/AACVPR/AAPA/ABC/ACPM/ADA/AGS/APhA/ASPC/NLA/PCNA Guideline on the Management of Blood Cholesterol: A Report of the American College of Cardiology/American Heart Association Task Force on Clinical Practice Guidelines. J Am Coll Cardiol. 2019;73(24):3168-3209. Olga Millers, MD Electronically Signed: By: Olga Millers M.D. On: 05/22/2022 17:17    Assessment & Plan:  Age-related osteoporosis without current pathological fracture -     Basic metabolic panel; Future -     Phosphorus; Future  Essential hypertension - Her BP is well controlled. -     Basic metabolic panel; Future -     CBC with Differential/Platelet; Future  Vitamin D deficiency -     Phosphorus; Future  Dyslipidemia, goal LDL below 130 - LDL goal achieved. Doing well on the statin  -     CK; Future  Flu vaccine need -     Flu Vaccine Trivalent High Dose (Fluad)  Stage 3a chronic kidney disease  (HCC)- Will discontinue the NSAIDS.  Plantar fasciitis, bilateral -     traMADol HCl; Take 1 tablet (50 mg total) by mouth every 8 (eight) hours as needed.  Dispense: 270 tablet; Refill: 0  Primary osteoarthritis, unspecified site -     traMADol HCl; Take 1 tablet (50 mg total) by mouth every 8 (eight) hours as needed.  Dispense: 270 tablet; Refill: 0     Follow-up: Return in about 6 months (around 01/29/2024).  Sanda Linger, MD

## 2023-08-02 ENCOUNTER — Encounter: Payer: Self-pay | Admitting: Internal Medicine

## 2023-08-02 MED ORDER — TRAMADOL HCL 50 MG PO TABS: 50.0000 mg | ORAL_TABLET | Freq: Three times a day (TID) | ORAL | 0 refills | Status: AC | PRN

## 2023-08-02 MED ORDER — MELOXICAM 7.5 MG PO TABS: 7.5000 mg | ORAL_TABLET | ORAL | 1 refills | Status: DC | PRN

## 2023-08-21 ENCOUNTER — Other Ambulatory Visit: Payer: Self-pay | Admitting: Internal Medicine

## 2023-08-21 DIAGNOSIS — I10 Essential (primary) hypertension: Secondary | ICD-10-CM

## 2023-08-21 DIAGNOSIS — I272 Pulmonary hypertension, unspecified: Secondary | ICD-10-CM

## 2023-08-21 DIAGNOSIS — E785 Hyperlipidemia, unspecified: Secondary | ICD-10-CM

## 2023-08-21 MED ORDER — FUROSEMIDE 20 MG PO TABS: 20.0000 mg | ORAL_TABLET | Freq: Every day | ORAL | 0 refills | Status: DC

## 2023-09-25 ENCOUNTER — Ambulatory Visit: Payer: Medicare PPO | Attending: Internal Medicine

## 2023-09-25 DIAGNOSIS — E782 Mixed hyperlipidemia: Secondary | ICD-10-CM

## 2023-09-25 LAB — LIPID PANEL
Chol/HDL Ratio: 2.1 {ratio} (ref 0.0–4.4)
Cholesterol, Total: 166 mg/dL (ref 100–199)
HDL: 80 mg/dL (ref >39)
LDL Chol Calc (NIH): 76 mg/dL (ref 0–99)
Triglycerides: 50 mg/dL (ref 0–149)
VLDL Cholesterol Cal: 10 mg/dL (ref 5–40)

## 2023-09-25 LAB — ALT: ALT: 26 [IU]/L (ref 0–32)

## 2023-09-30 ENCOUNTER — Telehealth: Payer: Self-pay | Admitting: Internal Medicine

## 2023-09-30 NOTE — Telephone Encounter (Signed)
Copied from CRM [id number]. Topic: Clinical - Medication Refill >> Sep 30, 2023  4:40 PM Thomes Dinning wrote: Most Recent Primary Care Visit:  Provider: Etta Grandchild  Department: [facility]  Visit Type: OFFICE VISIT  Date: 08/01/2023  Medication: Dietary Management Product (FOSTEUM PLUS) CAPS  Has the patient contacted their pharmacy? Yes (Agent: If no, request that the patient contact the pharmacy for the refill. If patient does not wish to contact the pharmacy document the reason why and proceed with request.) (Agent: If yes, when and what did the pharmacy advise?)  Is this the correct pharmacy for this prescription? Yes If no, delete pharmacy and type the correct one.  This is the patient's preferred pharmacy:  [facility] [place] - [phone] [place] [address] Phone: [phone] Fax: [phone]  Has the prescription been filled recently? Yes  Is the patient out of the medication? Yes  Has the patient been seen for an appointment in the last year OR does the patient have an upcoming appointment? Yes  Can we respond through MyChart? Yes  Agent: Please be advised that Rx refills may take up to 3 business days. We ask that you follow-up with your pharmacy.

## 2023-10-01 ENCOUNTER — Other Ambulatory Visit: Payer: Self-pay

## 2023-10-01 DIAGNOSIS — M810 Age-related osteoporosis without current pathological fracture: Secondary | ICD-10-CM

## 2023-10-01 MED ORDER — FOSTEUM PLUS PO CAPS: 1.0000 | ORAL_CAPSULE | Freq: Two times a day (BID) | ORAL | 1 refills | Status: DC

## 2023-10-01 NOTE — Telephone Encounter (Signed)
Request sent to provider.

## 2023-10-03 ENCOUNTER — Encounter: Payer: Self-pay | Admitting: Internal Medicine

## 2023-10-03 DIAGNOSIS — E782 Mixed hyperlipidemia: Secondary | ICD-10-CM

## 2023-10-04 MED ORDER — ATORVASTATIN CALCIUM 40 MG PO TABS: 40.0000 mg | ORAL_TABLET | Freq: Every day | ORAL | 3 refills | Status: AC

## 2023-10-16 DIAGNOSIS — H53143 Visual discomfort, bilateral: Secondary | ICD-10-CM | POA: Diagnosis not present

## 2023-11-11 IMAGING — DX DG CHEST 2V
2 series · 2 of 2 positions shown · non-contrast
Comparison: 05/12/2020

CLINICAL DATA: Productive cough and chest congestion for 4 days.

EXAM:
CHEST - 2 VIEW

[chest pa]
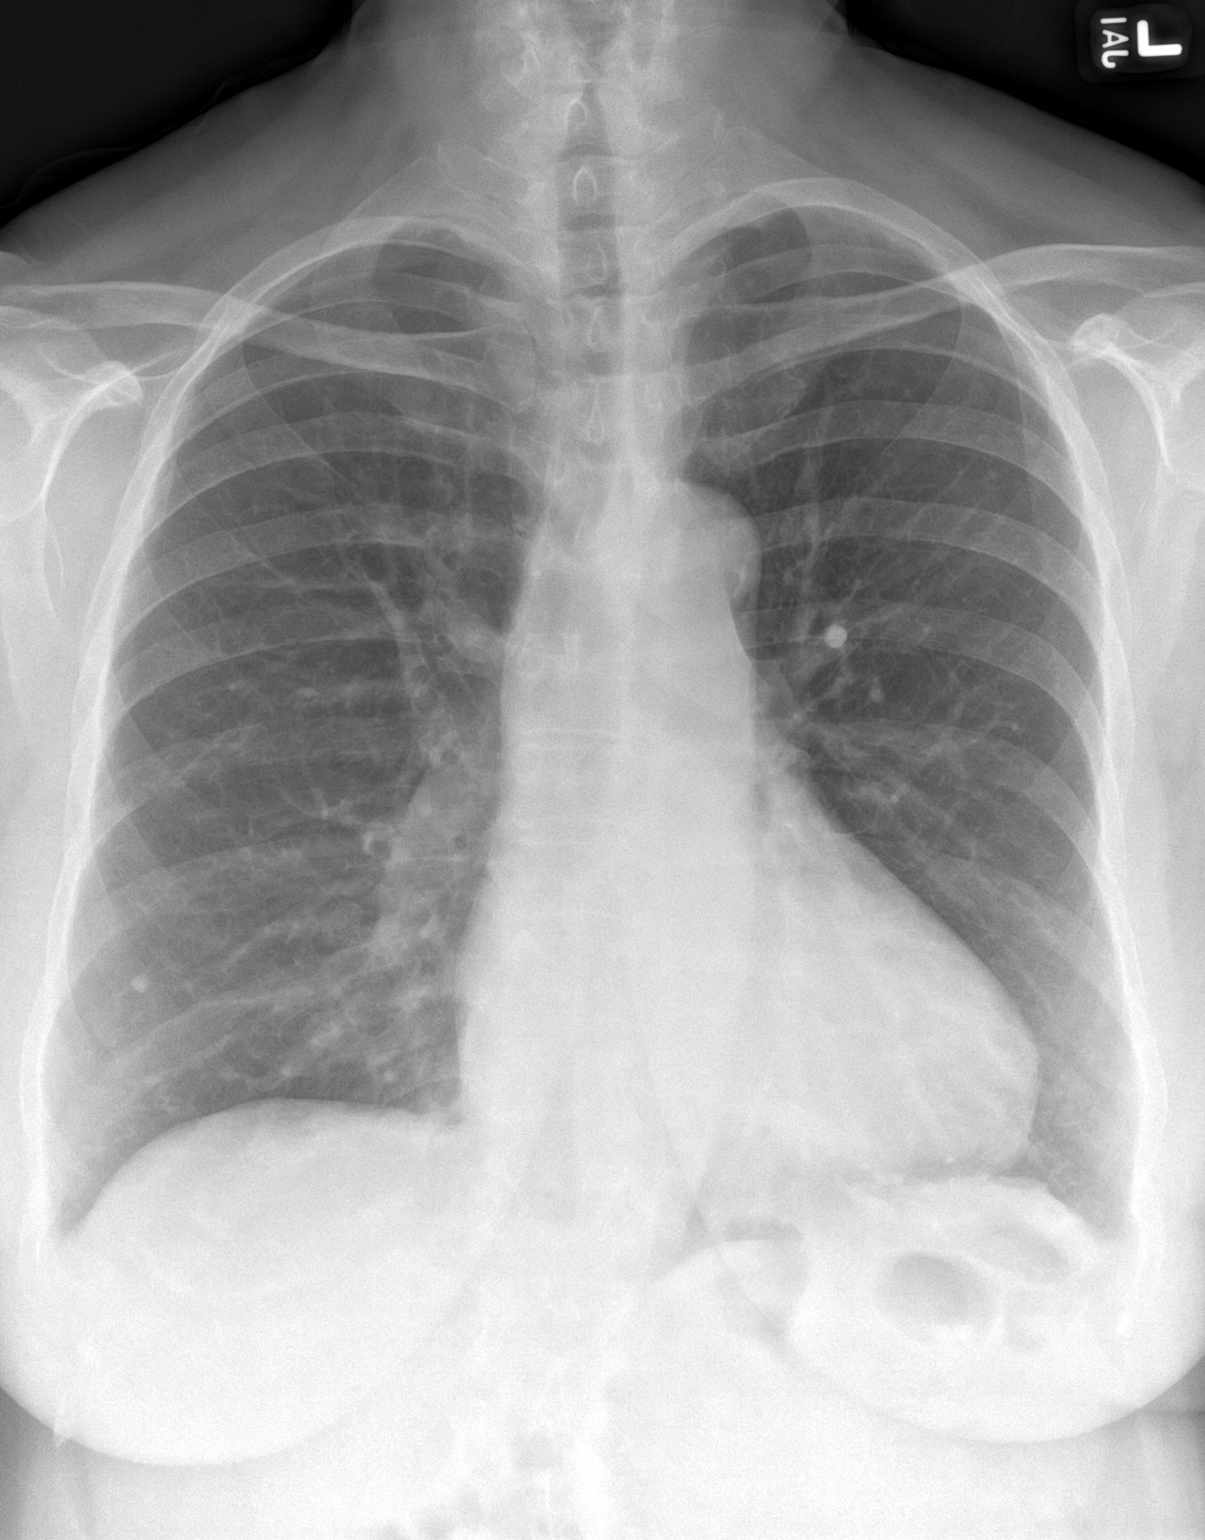

[chest lat]
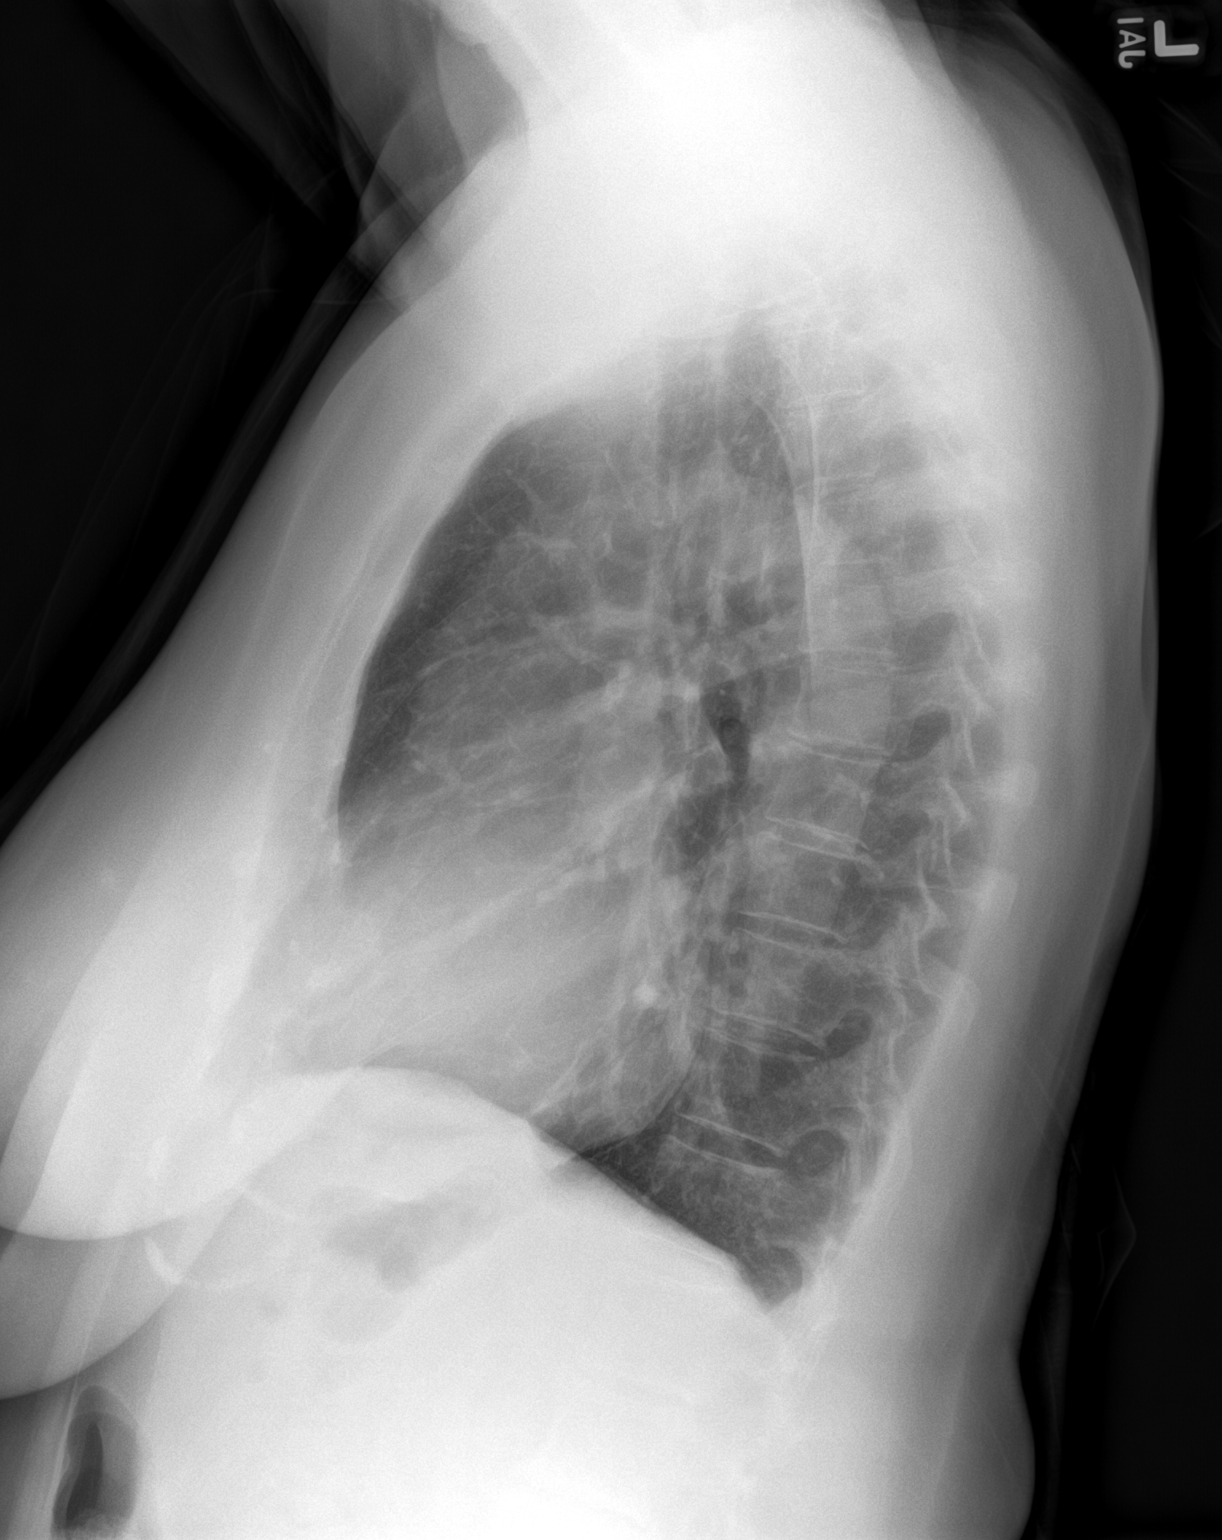

[2 of 2 positions shown; findings below may reference images not displayed]

FINDINGS: Stable mild cardiomegaly. Aortic atherosclerotic calcification
noted. Both lungs are clear. Small calcified granuloma again seen in
the right lower lung.
IMPRESSION: Stable mild cardiomegaly. No active lung disease.

## 2023-11-14 ENCOUNTER — Other Ambulatory Visit: Payer: Self-pay | Admitting: Internal Medicine

## 2023-11-14 DIAGNOSIS — I272 Pulmonary hypertension, unspecified: Secondary | ICD-10-CM

## 2023-11-14 DIAGNOSIS — I10 Essential (primary) hypertension: Secondary | ICD-10-CM

## 2024-01-02 DIAGNOSIS — E782 Mixed hyperlipidemia: Secondary | ICD-10-CM | POA: Diagnosis not present

## 2024-01-02 LAB — LIPID PANEL
Chol/HDL Ratio: 2 ratio (ref 0.0–4.4)
Cholesterol, Total: 149 mg/dL (ref 100–199)
HDL: 73 mg/dL (ref >39)
LDL Chol Calc (NIH): 63 mg/dL (ref 0–99)
Triglycerides: 63 mg/dL (ref 0–149)
VLDL Cholesterol Cal: 13 mg/dL (ref 5–40)

## 2024-01-02 LAB — ALT: ALT: 23 IU/L (ref 0–32)

## 2024-01-25 ENCOUNTER — Other Ambulatory Visit: Payer: Self-pay | Admitting: Internal Medicine

## 2024-01-25 DIAGNOSIS — M722 Plantar fascial fibromatosis: Secondary | ICD-10-CM

## 2024-01-25 DIAGNOSIS — M1991 Primary osteoarthritis, unspecified site: Secondary | ICD-10-CM

## 2024-02-11 ENCOUNTER — Other Ambulatory Visit: Payer: Self-pay | Admitting: Internal Medicine

## 2024-02-11 DIAGNOSIS — I10 Essential (primary) hypertension: Secondary | ICD-10-CM

## 2024-02-11 DIAGNOSIS — I272 Pulmonary hypertension, unspecified: Secondary | ICD-10-CM

## 2024-03-04 ENCOUNTER — Encounter: Payer: Self-pay | Admitting: Podiatry

## 2024-03-04 ENCOUNTER — Ambulatory Visit: Admitting: Podiatry

## 2024-03-04 VITALS — Ht 63.0 in | Wt 189.0 lb

## 2024-03-04 DIAGNOSIS — M779 Enthesopathy, unspecified: Secondary | ICD-10-CM

## 2024-03-04 MED ORDER — TRIAMCINOLONE ACETONIDE 10 MG/ML IJ SUSP
10.0000 mg | Freq: Once | INTRAMUSCULAR | Status: AC
  Administered 2024-03-04: 10 mg via INTRA_ARTICULAR

## 2024-03-05 DIAGNOSIS — L578 Other skin changes due to chronic exposure to nonionizing radiation: Secondary | ICD-10-CM | POA: Diagnosis not present

## 2024-03-05 DIAGNOSIS — L821 Other seborrheic keratosis: Secondary | ICD-10-CM | POA: Diagnosis not present

## 2024-03-05 DIAGNOSIS — L814 Other melanin hyperpigmentation: Secondary | ICD-10-CM | POA: Diagnosis not present

## 2024-03-05 DIAGNOSIS — D2272 Melanocytic nevi of left lower limb, including hip: Secondary | ICD-10-CM | POA: Diagnosis not present

## 2024-03-05 DIAGNOSIS — D1801 Hemangioma of skin and subcutaneous tissue: Secondary | ICD-10-CM | POA: Diagnosis not present

## 2024-03-05 NOTE — Progress Notes (Signed)
 Subjective:   Patient ID: Madison Rowe, female   DOB: 73 y.o.   MRN: [id number]   HPI Patient states she has developed a lot of pain in her right foot over the last several months and it has been over a year and a half since she has been here   ROS      Objective:  Physical Exam  Neurovascular status found to be intact muscle strength was adequate discomfort more around the anterior tibial tendon and around the ankle but mostly around the tendon and there is reduced strength of the tendon itself but it appears to be functioning well it is just painful and I think she is splinting.     Assessment:  Appears to be acute anterior tibial tendinitis right     Plan:  H&P reviewed I explained injection and risk of this and she understands risk of rupture.  Today I did a careful injection along the painful sheath of the tendon 3 mg dexamethasone Kenalog  5 mg Xylocaine and I applied fascial brace to lift up the arch and take pressure off the arch.  I gave strict instructions on support shoes and reduced activity and reappoint as symptoms indicate

## 2024-03-23 ENCOUNTER — Ambulatory Visit: Payer: Self-pay | Admitting: Internal Medicine

## 2024-03-23 ENCOUNTER — Encounter: Payer: Self-pay | Admitting: Internal Medicine

## 2024-03-23 ENCOUNTER — Ambulatory Visit: Admitting: Internal Medicine

## 2024-03-23 VITALS — BP 124/82 | HR 79 | Temp 97.5°F | Resp 16 | Ht 63.0 in | Wt 184.0 lb

## 2024-03-23 DIAGNOSIS — Z0000 Encounter for general adult medical examination without abnormal findings: Secondary | ICD-10-CM | POA: Diagnosis not present

## 2024-03-23 DIAGNOSIS — M810 Age-related osteoporosis without current pathological fracture: Secondary | ICD-10-CM

## 2024-03-23 DIAGNOSIS — I493 Ventricular premature depolarization: Secondary | ICD-10-CM | POA: Diagnosis not present

## 2024-03-23 DIAGNOSIS — E785 Hyperlipidemia, unspecified: Secondary | ICD-10-CM | POA: Diagnosis not present

## 2024-03-23 DIAGNOSIS — L900 Lichen sclerosus et atrophicus: Secondary | ICD-10-CM | POA: Insufficient documentation

## 2024-03-23 DIAGNOSIS — Z0001 Encounter for general adult medical examination with abnormal findings: Secondary | ICD-10-CM

## 2024-03-23 DIAGNOSIS — I10 Essential (primary) hypertension: Secondary | ICD-10-CM

## 2024-03-23 LAB — CBC WITH DIFFERENTIAL/PLATELET
Basophils Absolute: 0 K/uL (ref 0.0–0.1)
Basophils Relative: 0.5 % (ref 0.0–3.0)
Eosinophils Absolute: 0.1 K/uL (ref 0.0–0.7)
Eosinophils Relative: 1.6 % (ref 0.0–5.0)
HCT: 40.9 % (ref 36.0–46.0)
Hemoglobin: 13.5 g/dL (ref 12.0–15.0)
Lymphocytes Relative: 26.2 % (ref 12.0–46.0)
Lymphs Abs: 1.6 K/uL (ref 0.7–4.0)
MCHC: 33.1 g/dL (ref 30.0–36.0)
MCV: 87.3 fl (ref 78.0–100.0)
Monocytes Absolute: 0.5 K/uL (ref 0.1–1.0)
Monocytes Relative: 8.8 % (ref 3.0–12.0)
Neutro Abs: 3.9 K/uL (ref 1.4–7.7)
Neutrophils Relative %: 62.9 % (ref 43.0–77.0)
Platelets: 218 K/uL (ref 150.0–400.0)
RBC: 4.69 Mil/uL (ref 3.87–5.11)
RDW: 15.4 % (ref 11.5–15.5)
WBC: 6.2 K/uL (ref 4.0–10.5)

## 2024-03-23 LAB — URINALYSIS, ROUTINE W REFLEX MICROSCOPIC
Bilirubin Urine: NEGATIVE
Hgb urine dipstick: NEGATIVE
Ketones, ur: NEGATIVE
Leukocytes,Ua: NEGATIVE
Nitrite: NEGATIVE
RBC / HPF: NONE SEEN (ref 0–?)
Specific Gravity, Urine: 1.01 (ref 1.000–1.030)
Total Protein, Urine: NEGATIVE
Urine Glucose: NEGATIVE
Urobilinogen, UA: 0.2 (ref 0.0–1.0)
WBC, UA: NONE SEEN (ref 0–?)
pH: 6 (ref 5.0–8.0)

## 2024-03-23 LAB — BASIC METABOLIC PANEL WITH GFR
BUN: 18 mg/dL (ref 6–23)
CO2: 30 meq/L (ref 19–32)
Calcium: 9.5 mg/dL (ref 8.4–10.5)
Chloride: 100 meq/L (ref 96–112)
Creatinine, Ser: 0.82 mg/dL (ref 0.40–1.20)
GFR: 70.01 mL/min (ref >60.00)
Glucose, Bld: 97 mg/dL (ref 70–99)
Potassium: 3.6 meq/L (ref 3.5–5.1)
Sodium: 138 meq/L (ref 135–145)

## 2024-03-23 LAB — PHOSPHORUS: Phosphorus: 3.5 mg/dL (ref 2.3–4.6)

## 2024-03-23 LAB — TSH: TSH: 1.61 u[IU]/mL (ref 0.35–5.50)

## 2024-03-23 LAB — VITAMIN D 25 HYDROXY (VIT D DEFICIENCY, FRACTURES): VITD: 40.55 ng/mL (ref 30.00–100.00)

## 2024-03-23 MED ORDER — FOSTEUM PLUS PO CAPS: 1.0000 | ORAL_CAPSULE | Freq: Two times a day (BID) | ORAL | 1 refills | Status: DC

## 2024-03-23 NOTE — Patient Instructions (Signed)

## 2024-03-23 NOTE — Progress Notes (Unsigned)
 Subjective:  Patient ID: Madison Rowe, female    DOB: [birth year]  Age: 73 y.o. MRN: [id number]  CC: Annual Exam, Hypertension, and Hyperlipidemia   HPI Madison Rowe presents for a CPX and f/up -----  Discussed the use of AI scribe software for clinical note transcription with the patient, who gave verbal consent to proceed.  History of Present Illness   Madison Rowe is a 73 year old female who presents for medication refills and clarification of her kidney disease status.  She has a history of premature ventricular contractions (PVCs) but has not experienced recent episodes of irregular heartbeats, extra beats, or skipped beats. No abnormal EKGs have been noted in the past, and she is not aware of any irregular heartbeats. She has no symptoms suggestive of high blood pressure such as headaches, blurred vision, or swelling in her legs or feet.  The patient reports that her chart has listed both stage 3A and stage 2 chronic kidney disease at different times, and she is confused about the terminology and what it means. She is concerned about the terminology of 'disease' despite the improvement in her kidney function. She reports that she saw the diagnosis of kidney disease in her chart and was concerned about it, but was told during the visit that it has been removed since her last kidney test was normal. She is worried about the impact of her medications on her kidney health, particularly furosemide , which she takes regularly, and meloxicam , which she uses sparingly.  Her current medications include furosemide , atorvastatin , and meloxicam . She takes meloxicam  once or twice a week, sometimes less, and uses Tylenol Arthritis Strength occasionally for pain. She has a prescription for tramadol  but has not used it due to concerns about its narcotic nature. She also takes Fosteum Plus for bone density but has experienced issues with refilling it due to pharmacy communication problems.  She  reports that her most recent cholesterol results were HDL 73 and LDL 63, and she asked about the meaning of these numbers. She expressed that she does not want to have a heart attack or stroke and asked about her cholesterol numbers.  She reports that the last time she had blood drawn, her blood pressure was in the 120s. No gastrointestinal symptoms such as constipation, diarrhea, or cramping.       Outpatient Medications Prior to Visit  Medication Sig Dispense Refill   atorvastatin  (LIPITOR) 40 MG tablet Take 1 tablet (40 mg total) by mouth daily. 90 tablet 3   Cholecalciferol  50 MCG (2000 UT) TABS Take 1 tablet (2,000 Units total) by mouth daily. 90 tablet 1   clobetasol  cream (TEMOVATE ) 0.05 % APPLY EXTERNALLY TO THE AFFECTED AREA TWICE DAILY (Patient taking differently: as needed (rash).) 60 g 1   Dermatological Products, Misc. (DERMEND BRUISE FORMULA) LOTN as needed. bruising     estradiol (ESTRACE) 0.1 MG/GM vaginal cream SMARTSIG:Gram(s) Topical 3 Times a Week     fluticasone  (FLONASE ) 50 MCG/ACT nasal spray Place 2 sprays into both nostrils daily.     furosemide  (LASIX ) 20 MG tablet TAKE 1 TABLET(20 MG) BY MOUTH DAILY. NEEDS APPOINTMENT WITH DR.Rital Cavey FOR REFILLS 90 tablet 0   loratadine (CLARITIN) 10 MG tablet Take 10 mg by mouth daily.     Dietary Management Product (FOSTEUM PLUS) CAPS Take 1 capsule by mouth 2 (two) times daily. 180 capsule 1   No facility-administered medications prior to visit.    ROS Review of Systems  Constitutional:  Negative  for appetite change, chills, diaphoresis, fatigue and fever.  HENT: Negative.    Eyes: Negative.   Respiratory:  Negative for cough, chest tightness, shortness of breath and wheezing.   Cardiovascular:  Negative for chest pain, palpitations and leg swelling.  Gastrointestinal:  Negative for abdominal pain, constipation, diarrhea, nausea and vomiting.  Genitourinary:  Negative for difficulty urinating.  Musculoskeletal:  Positive for  arthralgias. Negative for joint swelling and myalgias.  Skin: Negative.   Neurological:  Negative for dizziness and weakness.  Hematological:  Negative for adenopathy. Does not bruise/bleed easily.  Psychiatric/Behavioral: Negative.      Objective:  BP 124/82 (BP Location: Left Arm, Patient Position: Sitting, Cuff Size: Normal)   Pulse 79   Temp (!) 97.5 F (36.4 C) (Oral)   Resp 16   Ht 5' 3 (1.6 m)   Wt 184 lb (83.5 kg)   SpO2 96%   BMI 32.59 kg/m   BP Readings from Last 3 Encounters:  03/23/24 124/82  08/01/23 122/74  06/27/23 124/72    Wt Readings from Last 3 Encounters:  03/23/24 184 lb (83.5 kg)  03/04/24 189 lb (85.7 kg)  08/01/23 189 lb 9.6 oz (86 kg)    Physical Exam Vitals reviewed.  Constitutional:      Appearance: Normal appearance.  HENT:     Mouth/Throat:     Mouth: Mucous membranes are moist.  Eyes:     General: No scleral icterus.    Conjunctiva/sclera: Conjunctivae normal.  Cardiovascular:     Rate and Rhythm: Normal rate and regular rhythm.     Heart sounds: No murmur heard.    No friction rub. No gallop.  Pulmonary:     Effort: Pulmonary effort is normal.     Breath sounds: No stridor. No wheezing, rhonchi or rales.  Chest:     Chest wall: No tenderness.  Abdominal:     Palpations: There is no mass.     Tenderness: There is no abdominal tenderness. There is no guarding.     Hernia: No hernia is present.  Musculoskeletal:        General: Normal range of motion.     Cervical back: Neck supple.     Right lower leg: No edema.     Left lower leg: No edema.  Lymphadenopathy:     Cervical: No cervical adenopathy.  Skin:    General: Skin is dry.  Neurological:     General: No focal deficit present.  Psychiatric:        Mood and Affect: Mood normal.     Lab Results  Component Value Date   WBC 6.2 03/23/2024   HGB 13.5 03/23/2024   HCT 40.9 03/23/2024   PLT 218.0 03/23/2024   GLUCOSE 97 03/23/2024   CHOL 149 01/02/2024   TRIG 63  01/02/2024   HDL 73 01/02/2024   LDLDIRECT 122.7 09/27/2011   LDLCALC 63 01/02/2024   ALT 23 01/02/2024   AST 17 06/07/2022   NA 138 03/23/2024   K 3.6 03/23/2024   CL 100 03/23/2024   CREATININE 0.82 03/23/2024   BUN 18 03/23/2024   CO2 30 03/23/2024   TSH 1.61 03/23/2024    CT CARDIAC SCORING (SELF PAY ONLY) Addendum Date: 05/23/2022 ADDENDUM REPORT: 05/23/2022 09:38 ADDENDUM: The following report is an over-read performed by radiologist Dr. Reyes Holder of [facility] on May 23, 2022. This over-read does not include interpretation of cardiac or coronary anatomy or pathology. The coronary calcium  score interpretation by the cardiologist  is attached. COMPARISON:  None. FINDINGS: Vascular: No acute non-cardiac vascular finding. Mediastinum/Nodes: Within the visualized portions of the chest there are no pathologically enlarged mediastinal, or hilar lymph nodes, noting limited sensitivity for the detection of hilar adenopathy on this noncontrast study. Small hiatal hernia. Lungs/Pleura: Clustered nodularity in the right-greater-than-left posterior upper lobes for instance on image 11/604 measuring up to 2 mm. Within the visualized portions of the thorax there is no pleural effusions and no pneumothorax Upper Abdomen: Visualized portions of the upper abdomen are unremarkable. Musculoskeletal: No acute osseous abnormality no acute osseous abnormality. IMPRESSION: 1. Clustered nodularity in the posterior right-greater-than-left lower lobes measuring up to 2 mm likely infectious or inflammatory. Per Fleischner Society Guidelines, if patient is low risk for malignancy, no routine follow-up imaging is recommended; if patient is high risk for malignancy, a non-contrast Chest CT at 12 months is optional. If performed and the nodule is stable at 12 months, no further follow-up is recommended. These guidelines do not apply to immunocompromised patients and patients with cancer. Follow up in  patients with significant comorbidities as clinically warranted. For lung cancer screening, adhere to Lung-RADS guidelines. Reference: Radiology. 2017; 284(1):228-43. 2. Small hiatal hernia. Electronically Signed   By: Reyes Holder M.D.   On: 05/23/2022 09:38   Result Date: 05/23/2022 CLINICAL DATA:  Cardiovascular Disease Risk stratification EXAM: Coronary Calcium  Score TECHNIQUE: A gated, non-contrast computed tomography scan of the heart was performed using 3mm slice thickness. Axial images were analyzed on a dedicated workstation. Calcium  scoring of the coronary arteries was performed using the Agatston method. FINDINGS: Coronary Calcium  Score: Left main: 0 Left anterior descending artery: 0 Left circumflex artery: 0 Right coronary artery: 0 Total: 0 Percentile: 0 Pericardium: Normal. Ascending Aorta: Normal caliber. Non-cardiac: See separate report from [facility]. IMPRESSION: Coronary calcium  score of 0. This was 0 percentile for age-, race-, and sex-matched controls. RECOMMENDATIONS: Coronary artery calcium  (CAC) score is a strong predictor of incident coronary heart disease (CHD) and provides predictive information beyond traditional risk factors. CAC scoring is reasonable to use in the decision to withhold, postpone, or initiate statin therapy in intermediate-risk or selected borderline-risk asymptomatic adults (age 34-75 years and LDL-C >=70 to <190 mg/dL) who do not have diabetes or established atherosclerotic cardiovascular disease (ASCVD).* In intermediate-risk (10-year ASCVD risk >=7.5% to <20%) adults or selected borderline-risk (10-year ASCVD risk >=5% to <7.5%) adults in whom a CAC score is measured for the purpose of making a treatment decision the following recommendations have been made: If CAC=0, it is reasonable to withhold statin therapy and reassess in 5 to 10 years, as long as higher risk conditions are absent (diabetes mellitus, family history of premature CHD in first degree  relatives (males <55 years; females <65 years), cigarette smoking, or LDL >=190 mg/dL). If CAC is 1 to 99, it is reasonable to initiate statin therapy for patients >=20 years of age. If CAC is >=100 or >=75th percentile, it is reasonable to initiate statin therapy at any age. Cardiology referral should be considered for patients with CAC scores >=400 or >=75th percentile. *2018 AHA/ACC/AACVPR/AAPA/ABC/ACPM/ADA/AGS/APhA/ASPC/NLA/PCNA Guideline on the Management of Blood Cholesterol: A Report of the American College of Cardiology/American Heart Association Task Force on Clinical Practice Guidelines. J Am Coll Cardiol. 2019;73(24):3168-3209. Redell Shallow, MD Electronically Signed: By: Redell Shallow M.D. On: 05/22/2022 17:17    Assessment & Plan:  Age-related osteoporosis without current pathological fracture -     VITAMIN D  25 Hydroxy (Vit-D Deficiency, Fractures); Future -  Phosphorus; Future -     Basic metabolic panel with GFR; Future -     Fosteum Plus; Take 1 capsule by mouth 2 (two) times daily.  Dispense: 180 capsule; Refill: 1 -     VITAMIN D  25 Hydroxy (Vit-D Deficiency, Fractures); Future -     Phosphorus; Future -     Basic metabolic panel with GFR; Future  Dyslipidemia, goal LDL below 130- LDL goal achieved. Doing well on the statin  -     TSH; Future -     TSH; Future  PVC (premature ventricular contraction)- She has good R/R control. -     TSH; Future -     TSH; Future  Essential hypertension- BP is well controlled. -     CBC with Differential/Platelet; Future -     Basic metabolic panel with GFR; Future -     Urinalysis, Routine w reflex microscopic; Future -     Urinalysis, Routine w reflex microscopic; Future -     CBC with Differential/Platelet; Future -     TSH; Future  Encounter for general adult medical examination with abnormal findings- Exam completed, labs reviewed, vaccines reviewed and, cancer screenings are UTD, pt ed material was given.      Follow-up:  Return in about 6 months (around 09/23/2024).  Debby Molt, MD

## 2024-03-24 ENCOUNTER — Ambulatory Visit (HOSPITAL_COMMUNITY)
Admission: RE | Admit: 2024-03-24 | Discharge: 2024-03-24 | Disposition: A | Discharge status: Home / Self Care | Payer: Medicare PPO | Source: Ambulatory Visit | Attending: Internal Medicine | Admitting: Internal Medicine

## 2024-03-24 DIAGNOSIS — I341 Nonrheumatic mitral (valve) prolapse: Secondary | ICD-10-CM | POA: Insufficient documentation

## 2024-03-24 DIAGNOSIS — E782 Mixed hyperlipidemia: Secondary | ICD-10-CM | POA: Insufficient documentation

## 2024-03-25 LAB — ECHOCARDIOGRAM COMPLETE
Area-P 1/2: 2.5 cm2
S' Lateral: 3 cm

## 2024-03-28 ENCOUNTER — Ambulatory Visit: Payer: Self-pay | Admitting: Internal Medicine

## 2024-05-12 ENCOUNTER — Other Ambulatory Visit: Payer: Self-pay | Admitting: Internal Medicine

## 2024-05-12 DIAGNOSIS — I272 Pulmonary hypertension, unspecified: Secondary | ICD-10-CM

## 2024-05-12 DIAGNOSIS — I10 Essential (primary) hypertension: Secondary | ICD-10-CM

## 2024-05-19 ENCOUNTER — Ambulatory Visit (INDEPENDENT_AMBULATORY_CARE_PROVIDER_SITE_OTHER)

## 2024-05-19 VITALS — Ht 63.0 in | Wt 184.0 lb

## 2024-05-19 DIAGNOSIS — Z0000 Encounter for general adult medical examination without abnormal findings: Secondary | ICD-10-CM

## 2024-05-19 NOTE — Progress Notes (Signed)
 Subjective:   Madison Rowe is a 73 y.o. who presents for a Medicare Wellness preventive visit.  As a reminder, Annual Wellness Visits don't include a physical exam, and some assessments may be limited, especially if this visit is performed virtually. We may recommend an in-person follow-up visit with your provider if needed.  Visit Complete: Virtual I connected with  Madison Rowe on 05/19/24 by a audio enabled telemedicine application and verified that I am speaking with the correct person using two identifiers.  Patient Location: Home  Provider Location: Home Office  I discussed the limitations of evaluation and management by telemedicine. The patient expressed understanding and agreed to proceed.  Vital Signs: Because this visit was a virtual/telehealth visit, some criteria may be missing or patient reported. Any vitals not documented were not able to be obtained and vitals that have been documented are patient reported.  VideoDeclined- This patient declined Librarian, academic. Therefore the visit was completed with audio only.  Persons Participating in Visit: Patient.  AWV Questionnaire: No: Patient Medicare AWV questionnaire was not completed prior to this visit.  Cardiac Risk Factors include: advanced age (>45men, >39 women);hypertension;dyslipidemia;obesity (BMI >30kg/m2)     Objective:    Today's Vitals   05/19/24 1459  Weight: 184 lb (83.5 kg)  Height: 5' 3 (1.6 m)   Body mass index is 32.59 kg/m.     05/19/2024    3:06 PM 06/03/2023   10:46 AM 05/07/2022    3:53 PM 05/05/2021    9:28 AM 01/30/2017    4:30 PM  Advanced Directives  Does Patient Have a Medical Advance Directive? Yes Yes Yes Yes Yes   Type of Estate agent of [place];Living will  Healthcare Power of [place];Living will Living will;Healthcare Power of [employer] Power of [place];Living will  Does patient want to make changes to medical  advance directive?  No - Patient declined  No - Guardian declined   Copy of Healthcare Power of Attorney in Chart? No - copy requested  No - copy requested No - copy requested      Data saved with a previous flowsheet row definition    Current Medications (verified) Outpatient Encounter Medications as of 05/19/2024  Medication Sig   atorvastatin  (LIPITOR) 40 MG tablet Take 1 tablet (40 mg total) by mouth daily.   Cholecalciferol  50 MCG (2000 UT) TABS Take 1 tablet (2,000 Units total) by mouth daily.   clobetasol  cream (TEMOVATE ) 0.05 % APPLY EXTERNALLY TO THE AFFECTED AREA TWICE DAILY (Patient taking differently: as needed (rash).)   Dermatological Products, Misc. (DERMEND BRUISE FORMULA) LOTN as needed. bruising   Dietary Management Product (FOSTEUM PLUS) CAPS Take 1 capsule by mouth 2 (two) times daily.   estradiol (ESTRACE) 0.1 MG/GM vaginal cream SMARTSIG:Gram(s) Topical 3 Times a Week   fluticasone  (FLONASE ) 50 MCG/ACT nasal spray Place 2 sprays into both nostrils daily.   furosemide  (LASIX ) 20 MG tablet TAKE 1 TABLET(20 MG) BY MOUTH DAILY   loratadine (CLARITIN) 10 MG tablet Take 10 mg by mouth daily.   No facility-administered encounter medications on file as of 05/19/2024.    Allergies (verified) Alendronate sodium [alendronate sodium], Olmesartan , and Codeine   History: Past Medical History:  Diagnosis Date   Allergy    Anemia    Arthritis    Osteoporosis 2010   Vitamin D  deficient osteomalacia 2010   Past Surgical History:  Procedure Laterality Date   BREAST SURGERY     COLONOSCOPY  07/09/2022   2018-polyp   ECTOPIC PREGNANCY SURGERY     FOOT SURGERY Left    laproscopy  1982   TONSILLECTOMY     Family History  Problem Relation Age of Onset   Hypertension Mother    Kidney disease Father    Cancer Sister    Kidney disease Maternal Grandfather    Heart disease Neg Hx    Colon cancer Neg Hx    Esophageal cancer Neg Hx    Rectal cancer Neg Hx    Stomach cancer  Neg Hx    Colon polyps Neg Hx    Social History   Socioeconomic History   Marital status: Married    Spouse name: Madison Rowe   Number of children: 2   Years of education: Not on file   Highest education level: Master's degree (e.g., MA, MS, MEng, MEd, MSW, MBA)  Occupational History   Occupation: RETIRED  Tobacco Use   Smoking status: Never   Smokeless tobacco: Never  Vaping Use   Vaping status: Never Used  Substance and Sexual Activity   Alcohol use: Yes    Comment: Occasional   Drug use: No   Sexual activity: Yes    Birth control/protection: Surgical  Other Topics Concern   Not on file  Social History Narrative   Lives with husband/2025 and 1 dog   Social Drivers of Health   Financial Resource Strain: Low Risk  (05/19/2024)   Overall Financial Resource Strain (CARDIA)    Difficulty of Paying Living Expenses: Not hard at all  Food Insecurity: No Food Insecurity (05/19/2024)   Hunger Vital Sign    Worried About Running Out of Food in the Last Year: Never true    Ran Out of Food in the Last Year: Never true  Transportation Needs: No Transportation Needs (05/19/2024)   PRAPARE - Administrator, Civil Service (Medical): No    Lack of Transportation (Non-Medical): No  Physical Activity: Sufficiently Active (05/19/2024)   Exercise Vital Sign    Days of Exercise per Week: 3 days    Minutes of Exercise per Session: 50 min  Stress: No Stress Concern Present (05/19/2024)   [employer] of Occupational Health - Occupational Stress Questionnaire    Feeling of Stress: Not at all  Social Connections: Socially Integrated (05/19/2024)   Social Connection and Isolation Panel    Frequency of Communication with Friends and Family: More than three times a week    Frequency of Social Gatherings with Friends and Family: Three times a week    Attends Religious Services: More than 4 times per year    Active Member of Clubs or Organizations: Yes    Attends Hospital doctor: More than 4 times per year    Marital Status: Married    Tobacco Counseling Counseling given: Not Answered    Clinical Intake:  Pre-visit preparation completed: Yes  Pain : No/denies pain     BMI - recorded: 32.59 Nutritional Status: BMI > 30  Obese Nutritional Risks: None Diabetes: No  No results found for: HGBA1C   How often do you need to have someone help you when you read instructions, pamphlets, or other written materials from your doctor or pharmacy?: 1 - Never  Interpreter Needed?: No  Information entered by :: Madison Rowe, Madison Rowe   Activities of Daily Living     05/19/2024    3:01 PM 06/03/2023   10:46 AM  In your present state of health, do you have  any difficulty performing the following activities:  Hearing? 0 0  Vision? 0 0  Difficulty concentrating or making decisions? 0 0  Walking or climbing stairs? 0 0  Dressing or bathing? 0 0  Doing errands, shopping? 0 0  Preparing Food and eating ? N N  Using the Toilet? N N  In the past six months, have you accidently leaked urine? N N  Do you have problems with loss of bowel control? N N  Managing your Medications? N N  Managing your Finances? N N  Housekeeping or managing your Housekeeping? N N    Patient Care Team: Joshua Debby CROME, MD as PCP - General (Internal Medicine) Santo Stanly LABOR, MD as PCP - Cardiology (Cardiology) Cleotilde Sewer, OD as Consulting Physician (Optometry)  I have updated your Care Teams any recent Medical Services you may have received from other providers in the past year.     Assessment:   This is a routine wellness examination for Madison Rowe.  Hearing/Vision screen Hearing Screening - Comments:: Denies hearing difficulties   Vision Screening - Comments:: Wears contacts/Dr. Cleotilde   Goals Addressed             This Visit's Progress    Patient Stated   On track    I would like to lose a little more weight.       Depression Screen      05/19/2024    3:09 PM 06/03/2023   10:45 AM 01/24/2023    1:28 PM 08/16/2022    2:47 PM 07/13/2022    1:34 PM 07/13/2022    1:33 PM 06/20/2022    8:39 AM  PHQ 2/9 Scores  PHQ - 2 Score 0 0 0 1 0 0 1  PHQ- 9 Score 0   2 1      Fall Risk     05/19/2024    3:07 PM 06/03/2023   10:46 AM 06/02/2023    1:25 PM 01/24/2023    1:28 PM 08/16/2022    2:47 PM  Fall Risk   Falls in the past year? 0 0 0 0 0  Number falls in past yr: 0 0  0 0  Injury with Fall? 0 0 0 0 0  Risk for fall due to :  No Fall Risks  No Fall Risks No Fall Risks  Follow up Falls evaluation completed;Falls prevention discussed Falls evaluation completed  Falls evaluation completed Falls evaluation completed      Data saved with a previous flowsheet row definition    MEDICARE RISK AT HOME:  Medicare Risk at Home Any stairs in or around the home?: Yes If so, are there any without handrails?: No Home free of loose throw rugs in walkways, pet beds, electrical cords, etc?: Yes Adequate lighting in your home to reduce risk of falls?: Yes Life alert?: No Use of a cane, walker or w/c?: No Grab bars in the bathroom?: No Shower chair or bench in shower?: Yes Elevated toilet seat or a handicapped toilet?: No  TIMED UP AND GO:  Was the test performed?  No  Cognitive Function: Declined/Normal: No cognitive concerns noted by patient or family. Patient alert, oriented, able to answer questions appropriately and recall recent events. No signs of memory loss or confusion.        06/03/2023   10:47 AM 05/07/2022    3:53 PM  6CIT Screen  What Year? 0 points 0 points  What month? 0 points 0 points  What time? 0 points 0  points  Count back from 20 0 points 0 points  Months in reverse 0 points 0 points  Repeat phrase 0 points 0 points  Total Score 0 points 0 points    Immunizations Immunization History  Administered Date(s) Administered   Fluad Quad(high Dose 65+) 06/16/2019, 07/13/2022   Fluad Trivalent(High Dose 65+)  08/01/2023   INFLUENZA, HIGH DOSE SEASONAL PF 07/11/2016, 06/17/2017, 07/19/2020   Influenza-Unspecified 06/21/2014, 07/30/2018, 07/25/2021   PFIZER(Purple Top)SARS-COV-2 Vaccination 09/30/2019, 10/22/2019, 06/15/2020, 01/23/2021   Pfizer Covid-19 Vaccine Bivalent Booster 75yrs & up 07/25/2021   Pneumococcal Conjugate-13 07/11/2016   Pneumococcal Polysaccharide-23 07/11/2017   Tdap 09/20/2010, 03/07/2021   Zoster Recombinant(Shingrix) 01/21/2017, 07/11/2017   Zoster, Live 10/19/2009    Screening Tests Health Maintenance  Topic Date Due   Influenza Vaccine  04/10/2024   Medicare Annual Wellness (AWV)  06/02/2024   MAMMOGRAM  05/27/2024   Colonoscopy  07/10/2027   DTaP/Tdap/Td (3 - Td or Tdap) 03/08/2031   Pneumococcal Vaccine: 50+ Years  Completed   DEXA SCAN  Completed   Hepatitis C Screening  Completed   Zoster Vaccines- Shingrix  Completed   HPV VACCINES  Aged Out   Meningococcal B Vaccine  Aged Out   COVID-19 Vaccine  Discontinued    Health Maintenance Items Addressed: Mammogram scheduled, DEXA scheduled, See Nurse Notes at the end of this note  Additional Screening:  Vision Screening: Recommended annual ophthalmology exams for early detection of glaucoma and other disorders of the eye. Is the patient up to date with their annual eye exam?  No  Who is the provider or what is the name of the office in which the patient attends annual eye exams? Dr. Ginnie Pinal  Dental Screening: Recommended annual dental exams for proper oral hygiene  Community Resource Referral / Chronic Care Management: CRR required this visit?  No   CCM required this visit?  No   Plan:    I have personally reviewed and noted the following in the patient's chart:   Medical and social history Use of alcohol, tobacco or illicit drugs  Current medications and supplements including opioid prescriptions. Patient is not currently taking opioid prescriptions. Functional ability and  status Nutritional status Physical activity Advanced directives List of other physicians Hospitalizations, surgeries, and ER visits in previous 12 months Vitals Screenings to include cognitive, depression, and falls Referrals and appointments  In addition, I have reviewed and discussed with patient certain preventive protocols, quality metrics, and best practice recommendations. A written personalized care plan for preventive services as well as general preventive health recommendations were provided to patient.   Madison Rowe, CMA   05/19/2024   After Visit Summary: (MyChart) Due to this being a telephonic visit, the after visit summary with patients personalized plan was offered to patient via MyChart   Notes: Patient is due for a Flu vaccine.  She stated that he had started feeling a sore throat and now has some drainage and slight headache x 1day.  Patient stated that she took a Covid home test and it came back negative.   She is currently taking throat loungers and will take some Nyquil.  I informed patient if symptoms worsen within 24 hr then she would need to be evaluated by a doctor. She had no other concerns to address today.

## 2024-05-19 NOTE — Patient Instructions (Signed)
 Ms. Madison Rowe,  Thank you for taking the time for your Medicare Wellness Visit. I appreciate your continued commitment to your health goals. Please review the care plan we discussed, and feel free to reach out if I can assist you further.  Medicare recommends these wellness visits once per year to help you and your care team stay ahead of potential health issues. These visits are designed to focus on prevention, allowing your provider to concentrate on managing your acute and chronic conditions during your regular appointments.  Please note that Annual Wellness Visits do not include a physical exam. Some assessments may be limited, especially if the visit was conducted virtually. If needed, we may recommend a separate in-person follow-up with your provider.  Ongoing Care Seeing your primary care provider every 3 to 6 months helps us  monitor your health and provide consistent, personalized care. Last office visit on 03/23/2024.  You are due for a flu vaccine and can get that done at your local pharmacy.  Referrals If a referral was made during today's visit and you haven't received any updates within two weeks, please contact the referred provider directly to check on the status.  Recommended Screenings:  Health Maintenance  Topic Date Due   Flu Shot  04/10/2024   Medicare Annual Wellness Visit  06/02/2024   Mammogram  05/27/2024   DTaP/Tdap/Td vaccine (3 - Td or Tdap) 03/08/2031   Pneumococcal Vaccine for age over 14  Completed   DEXA scan (bone density measurement)  Completed   Hepatitis C Screening  Completed   Zoster (Shingles) Vaccine  Completed   HPV Vaccine  Aged Out   Meningitis B Vaccine  Aged Out   Colon Cancer Screening  Discontinued   COVID-19 Vaccine  Discontinued       05/19/2024    3:06 PM  Advanced Directives  Does Patient Have a Medical Advance Directive? Yes  Type of Estate agent of [place];Living will  Copy of Healthcare Power of Attorney in  Chart? No - copy requested   Advance Care Planning is important because it: Ensures you receive medical care that aligns with your values, goals, and preferences. Provides guidance to your family and loved ones, reducing the emotional burden of decision-making during critical moments.  Vision: Annual vision screenings are recommended for early detection of glaucoma, cataracts, and diabetic retinopathy. These exams can also reveal signs of chronic conditions such as diabetes and high blood pressure.  Dental: Annual dental screenings help detect early signs of oral cancer, gum disease, and other conditions linked to overall health, including heart disease and diabetes.  Please see the attached documents for additional preventive care recommendations.

## 2024-05-20 ENCOUNTER — Ambulatory Visit: Payer: Self-pay

## 2024-05-20 NOTE — Telephone Encounter (Signed)
 FYI Only or Action Required?: FYI only for provider.  Patient was last seen in primary care on 03/23/2024 by Joshua Debby CROME, MD.  Called Nurse Triage reporting Headache.  Symptoms began Sunday.  Interventions attempted: OTC medications: OTC cold medication & allergy medication.  Symptoms are: gradually worsening.  Triage Disposition: See PCP When Office is Open (Within 3 Days)  Patient/caregiver understands and will follow disposition?: Yes   Copied from CRM #[id number]. Topic: Clinical - Red Word Triage >> May 20, 2024  3:15 PM [place] J wrote: [facility] that prompted transfer to Nurse Triage: Patient is having congestion, headaches, and body pain. This has been going on since Sunday. Reason for Disposition  [1] MILD-MODERATE headache AND [2] present > 3 days (72 hours) AND [3] no improvement after using Care Advice  Answer Assessment - Initial Assessment Questions 1. LOCATION: Where does it hurt?      Forehead over eye brows 2. ONSET: When did the headache start? (e.g., minutes, hours, days)      Sunday 3. PATTERN: Does the pain come and go, or has it been constant since it started?     constant 4. SEVERITY: How bad is the pain? and What does it keep you from doing?  (e.g., Scale 1-10; mild, moderate, or severe)     5/10 5. RECURRENT SYMPTOM: Have you ever had headaches before? If Yes, ask: When was the last time? and What happened that time?      unsure 6. CAUSE: What do you think is causing the headache?     unknown 7. MIGRAINE: Have you been diagnosed with migraine headaches? If Yes, ask: Is this headache similar?      no 8. HEAD INJURY: Has there been any recent injury to your head?      no 9. OTHER SYMPTOMS: Do you have any other symptoms? (e.g., fever, stiff neck, eye pain, sore throat, cold symptoms)    Low grade fever at times, congestion, mucous- yellow, nasal congestion, body pains 10. PREGNANCY: Is there any chance you are pregnant? When  was your last menstrual period?       na  Took COVID Monday was negative and pt stated she will take another tonight  Protocols used: [facility]

## 2024-05-21 ENCOUNTER — Ambulatory Visit: Admitting: Internal Medicine

## 2024-05-21 ENCOUNTER — Encounter: Payer: Self-pay | Admitting: Internal Medicine

## 2024-05-21 VITALS — HR 80 | Temp 98.4°F | Ht 63.0 in | Wt 187.0 lb

## 2024-05-21 DIAGNOSIS — U071 COVID-19: Secondary | ICD-10-CM

## 2024-05-21 MED ORDER — NIRMATRELVIR/RITONAVIR (PAXLOVID)TABLET: 3.0000 | ORAL_TABLET | Freq: Two times a day (BID) | ORAL | 0 refills | Status: AC

## 2024-05-21 NOTE — Progress Notes (Signed)
 Subjective:    Patient ID: Madison Rowe, female    DOB: 02/26/1951, 73 y.o.   MRN: [id number]      HPI Tahliyah is here for  Chief Complaint  Patient presents with   Covid Positive    Started feeling bad on Sunday; Tested positive on yesterday and was positive; body aches and headaches today.   Discussed the use of AI scribe software for clinical note transcription with the patient, who gave verbal consent to proceed.  History of Present Illness Madison Rowe is a 73 year old female who presents with symptoms of COVID-19.  Symptoms began on Sunday after returning from a trip to [place]. Initially, she experienced fatigue, which she attributed to travel, but her condition progressed to include body aches, headache, sore throat, and nasal congestion. She initially thought it was a cold, as her colds typically start with a sore throat.  She tested positive for COVID-19 on Monday night and tested positive again on Tuesday night. She reports a low-grade fever of 99.3F yesterday. No ear pain, stomach symptoms, significant cough, shortness of breath, or wheezing. She experiences sinus pain and pressure associated with her headache.  Current medications for symptom relief include Nyquil at night, Sudafed during the day, Tylenol, and throat lozenges, which provide only temporary relief. She had difficulty sleeping last night due to body aches, although she slept well the first night.  She had COVID-19 previously in May 2022, which presented as a bad cold with sore throat and congestion, but this time she notes more significant body aches and headache. Her husband is not currently sick, and she has been sleeping in separate rooms to prevent transmission.        Medications and allergies reviewed with patient and updated if appropriate.  Current Outpatient Medications on File Prior to Visit  Medication Sig Dispense Refill   atorvastatin  (LIPITOR) 40 MG tablet Take 1 tablet (40 mg  total) by mouth daily. 90 tablet 3   Cholecalciferol  50 MCG (2000 UT) TABS Take 1 tablet (2,000 Units total) by mouth daily. 90 tablet 1   clobetasol  cream (TEMOVATE ) 0.05 % APPLY EXTERNALLY TO THE AFFECTED AREA TWICE DAILY (Patient taking differently: as needed (rash).) 60 g 1   Dermatological Products, Misc. (DERMEND BRUISE FORMULA) LOTN as needed. bruising     Dietary Management Product (FOSTEUM PLUS) CAPS Take 1 capsule by mouth 2 (two) times daily. 180 capsule 1   estradiol (ESTRACE) 0.1 MG/GM vaginal cream SMARTSIG:Gram(s) Topical 3 Times a Week     fluticasone  (FLONASE ) 50 MCG/ACT nasal spray Place 2 sprays into both nostrils daily.     furosemide  (LASIX ) 20 MG tablet TAKE 1 TABLET(20 MG) BY MOUTH DAILY 90 tablet 0   loratadine (CLARITIN) 10 MG tablet Take 10 mg by mouth daily.     No current facility-administered medications on file prior to visit.    Review of Systems  Constitutional:  Positive for fatigue and fever (99.9).  HENT:  Positive for congestion, sinus pain (frontal), sore throat and voice change. Negative for ear pain.   Respiratory:  Positive for cough (little). Negative for shortness of breath and wheezing.   Gastrointestinal:  Negative for diarrhea and nausea.  Musculoskeletal:  Positive for myalgias.  Neurological:  Positive for headaches. Negative for light-headedness.       Objective:   Vitals:   05/21/24 1125  Pulse: 80  Temp: 98.4 F (36.9 C)  SpO2: 98%   BP Readings from Last 3  Encounters:  03/23/24 124/82  08/01/23 122/74  06/27/23 124/72   Wt Readings from Last 3 Encounters:  05/21/24 187 lb (84.8 kg)  05/19/24 184 lb (83.5 kg)  03/23/24 184 lb (83.5 kg)   Body mass index is 33.13 kg/m.    Physical Exam Constitutional:      General: She is not in acute distress.    Appearance: Normal appearance. She is not ill-appearing.  HENT:     Head: Normocephalic and atraumatic.     Right Ear: Tympanic membrane, ear canal and external ear normal.      Left Ear: Tympanic membrane, ear canal and external ear normal.     Mouth/Throat:     Mouth: Mucous membranes are moist.     Pharynx: No oropharyngeal exudate or posterior oropharyngeal erythema.  Eyes:     Conjunctiva/sclera: Conjunctivae normal.  Cardiovascular:     Rate and Rhythm: Normal rate and regular rhythm.  Pulmonary:     Effort: Pulmonary effort is normal. No respiratory distress.     Breath sounds: Normal breath sounds. No wheezing or rales.  Musculoskeletal:     Cervical back: Neck supple. No tenderness.  Lymphadenopathy:     Cervical: No cervical adenopathy.  Skin:    General: Skin is warm and dry.  Neurological:     Mental Status: She is alert.            Assessment & Plan:    Assessment and Plan Assessment & Plan COVID-19 infection Confirmed COVID-19 infection with mild-mod symptoms.  Given age she is at an increased risk of consequences from COVID Discussed treatment options  - Prescribe Paxlovid  for 5 days. Hold atorvastatin  during treatment. - Maximize symptomatic treatment with acetaminophen and OTC medications. - Encourage rest and increased fluid intake. - Advise isolation for at least 5 days from symptom onset, followed by masking if around others. - Call with any questions or concerns

## 2024-05-21 NOTE — Patient Instructions (Addendum)
      Medications changes include :   Paxlovid   x 5 day.  Hold the atorvastatin  while on paxlovid       Return if symptoms worsen or fail to improve.

## 2024-06-08 DIAGNOSIS — Z1231 Encounter for screening mammogram for malignant neoplasm of breast: Secondary | ICD-10-CM | POA: Diagnosis not present

## 2024-06-08 DIAGNOSIS — M8589 Other specified disorders of bone density and structure, multiple sites: Secondary | ICD-10-CM | POA: Diagnosis not present

## 2024-06-08 LAB — HM DEXA SCAN

## 2024-06-08 LAB — HM MAMMOGRAPHY

## 2024-06-16 ENCOUNTER — Encounter

## 2024-06-18 DIAGNOSIS — Z01419 Encounter for gynecological examination (general) (routine) without abnormal findings: Secondary | ICD-10-CM | POA: Diagnosis not present

## 2024-06-18 DIAGNOSIS — L900 Lichen sclerosus et atrophicus: Secondary | ICD-10-CM | POA: Diagnosis not present

## 2024-06-18 DIAGNOSIS — Z780 Asymptomatic menopausal state: Secondary | ICD-10-CM | POA: Diagnosis not present

## 2024-06-18 DIAGNOSIS — M8580 Other specified disorders of bone density and structure, unspecified site: Secondary | ICD-10-CM | POA: Diagnosis not present

## 2024-06-19 ENCOUNTER — Ambulatory Visit: Admitting: Family Medicine

## 2024-06-19 ENCOUNTER — Encounter: Payer: Self-pay | Admitting: Family Medicine

## 2024-06-19 VITALS — BP 102/70 | HR 76 | Temp 98.2°F | Ht 63.0 in | Wt 185.8 lb

## 2024-06-19 DIAGNOSIS — R059 Cough, unspecified: Secondary | ICD-10-CM | POA: Diagnosis not present

## 2024-06-19 DIAGNOSIS — J029 Acute pharyngitis, unspecified: Secondary | ICD-10-CM

## 2024-06-19 DIAGNOSIS — B9689 Other specified bacterial agents as the cause of diseases classified elsewhere: Secondary | ICD-10-CM

## 2024-06-19 DIAGNOSIS — R0989 Other specified symptoms and signs involving the circulatory and respiratory systems: Secondary | ICD-10-CM

## 2024-06-19 DIAGNOSIS — J069 Acute upper respiratory infection, unspecified: Secondary | ICD-10-CM

## 2024-06-19 LAB — POC COVID19 BINAXNOW: SARS Coronavirus 2 Ag: NEGATIVE

## 2024-06-19 LAB — POCT INFLUENZA A/B
Influenza A, POC: NEGATIVE
Influenza B, POC: NEGATIVE

## 2024-06-19 MED ORDER — AMOXICILLIN-POT CLAVULANATE 875-125 MG PO TABS: 1.0000 | ORAL_TABLET | Freq: Two times a day (BID) | ORAL | 0 refills | Status: DC

## 2024-06-19 NOTE — Patient Instructions (Signed)
 I have sent in Augmentin  for you to take twice a day for 7 days.  This medication can upset your stomach, so I tell everyone to take it with a meal.  Recommend waiting until Sunday to start this medication.  Follow-up with me for new or worsening symptoms.

## 2024-06-19 NOTE — Progress Notes (Signed)
 Acute Office Visit  Subjective:     Patient ID: Madison Rowe, female    DOB: [birth year], 73 y.o.   MRN: [id number]  Chief Complaint  Patient presents with   Acute Visit    Cough, runny nose, sore throat s-th, HA, tired, no energy. Took at home Covid test on Mon or Tues, results were negative     HPI  Discussed the use of AI scribe software for clinical note transcription with the patient, who gave verbal consent to proceed.  History of Present Illness Madison Rowe is a 73 year old female who presents with upper respiratory symptoms since Sunday.  Upper respiratory symptoms - Onset of symptoms since Sunday (5 days duration) - Nasal congestion present, fatigue - Green nasal discharge, darker in color this morning - Frequent nose blowing - Post-nasal drip leading to cough - No fever - No gastrointestinal upset  Otolaryngologic symptoms - Ear pain associated with blowing her nose - Popping sensation in ears when blowing nose - Ear pain not severe otherwise  Recent infectious disease testing and treatment - Negative influenza and COVID-19 tests - Previously took Paxlovid  in September, which caused metallic taste and nausea - Reluctant to take Paxlovid  again due to prior side effects     ROS Per HPI      Objective:    BP 102/70 (BP Location: Left Arm, Patient Position: Sitting)   Pulse 76   Temp 98.2 F (36.8 C) (Oral)   Ht 5' 3 (1.6 m)   Wt 185 lb 12.8 oz (84.3 kg)   SpO2 97%   BMI 32.91 kg/m    Physical Exam Vitals and nursing note reviewed.  Constitutional:      General: She is not in acute distress.    Comments: Appears fatigued  HENT:     Head: Normocephalic and atraumatic.     Right Ear: External ear normal.     Left Ear: External ear normal.     Nose: Congestion present.     Right Sinus: Frontal sinus tenderness present.     Left Sinus: Frontal sinus tenderness present.     Comments: Mild frontal sinus tenderness    Mouth/Throat:      Mouth: Mucous membranes are moist.     Comments: Oropharyngeal cobblestoning   Eyes:     Extraocular Movements: Extraocular movements intact.  Cardiovascular:     Rate and Rhythm: Normal rate and regular rhythm.     Pulses: Normal pulses.     Heart sounds: Normal heart sounds.  Pulmonary:     Effort: Pulmonary effort is normal. No respiratory distress.     Breath sounds: Normal breath sounds. No wheezing, rhonchi or rales.  Musculoskeletal:        General: Normal range of motion.     Cervical back: Normal range of motion.     Right lower leg: No edema.     Left lower leg: No edema.  Lymphadenopathy:     Cervical: Cervical adenopathy present.  Neurological:     General: No focal deficit present.     Mental Status: She is alert and oriented to person, place, and time.  Psychiatric:        Mood and Affect: Mood normal.        Thought Content: Thought content normal.     Results for orders placed or performed in visit on 06/19/24  POC COVID-19 BinaxNow  Result Value Ref Range   SARS Coronavirus 2 Ag Negative Negative  POCT Influenza A/B  Result Value Ref Range   Influenza A, POC Negative Negative   Influenza B, POC Negative Negative        Assessment & Plan:   Assessment and Plan Assessment & Plan Acute upper respiratory infection, cough, pharyngitis Symptoms consistent with prolonged cold, potential bacterial infection. COVID-19 and influenza tests negative. - Prescribed Augmentin  to be filled at [facility]. - Advised to start Augmentin  if symptoms do not improve by tomorrow afternoon. - Instructed to monitor symptoms and contact if not improved by Monday or Tuesday.     Orders Placed This Encounter  Procedures   POC COVID-19 BinaxNow   POCT Influenza A/B     Meds ordered this encounter  Medications   amoxicillin -clavulanate (AUGMENTIN ) 875-125 MG tablet    Sig: Take 1 tablet by mouth 2 (two) times daily.    Dispense:  14 tablet     Refill:  0    Return if symptoms worsen or fail to improve.  Madison LITTIE Ku, FNP

## 2024-07-06 ENCOUNTER — Ambulatory Visit: Payer: Self-pay

## 2024-07-06 ENCOUNTER — Ambulatory Visit: Admitting: Family Medicine

## 2024-07-06 ENCOUNTER — Encounter: Payer: Self-pay | Admitting: Family Medicine

## 2024-07-06 VITALS — BP 124/78 | HR 92 | Temp 97.8°F | Resp 18 | Ht 63.0 in | Wt 188.0 lb

## 2024-07-06 DIAGNOSIS — J0110 Acute frontal sinusitis, unspecified: Secondary | ICD-10-CM | POA: Diagnosis not present

## 2024-07-06 MED ORDER — DOXYCYCLINE HYCLATE 100 MG PO TABS: 100.0000 mg | ORAL_TABLET | Freq: Two times a day (BID) | ORAL | 0 refills | Status: AC

## 2024-07-06 MED ORDER — PREDNISONE 10 MG (21) PO TBPK: ORAL_TABLET | ORAL | 0 refills | Status: DC

## 2024-07-06 NOTE — Telephone Encounter (Signed)
 FYI Only or Action Required?: FYI only for provider.  Patient was last seen in primary care on 06/19/2024 by Alvia Corean CROME, FNP.  Called Nurse Triage reporting Cough.  Symptoms began several weeks ago.  Interventions attempted: Rest, hydration, or home remedies.  Symptoms are: gradually worsening.  Triage Disposition: See Physician Within 24 Hours  Patient/caregiver understands and will follow disposition?: Yes   Copied from CRM [id number]. Topic: Clinical - Red Word Triage >> Jul 06, 2024  8:58 AM Gustabo D wrote: Pt was seen and given antibiotics but still can't get over the cough, sore throat,headache,  and shortness of breath Its been over 2 weeks Reason for Disposition  SEVERE coughing spells (e.g., whooping sound after coughing, vomiting after coughing)  Answer Assessment - Initial Assessment Questions 1. ONSET: When did the cough begin?      Patient was seen in office for this over 2 weeks ago 2. SEVERITY: How bad is the cough today?      Patient states cough is worsened 3. SPUTUM: Describe the color of your sputum (e.g., none, dry cough; clear, white, yellow, green)     Patient gets tiny bit of sputum 4. HEMOPTYSIS: Are you coughing up any blood? If Yes, ask: How much? (e.g., flecks, streaks, tablespoons, etc.)     No 5. DIFFICULTY BREATHING: Are you having difficulty breathing? If Yes, ask: How bad is it? (e.g., mild, moderate, severe)      Patient states that her breathing is harder and she notices it when she goes up the stairs 6. FEVER: Do you have a fever? If Yes, ask: What is your temperature, how was it measured, and when did it start?     Patient does not think so 7. CARDIAC HISTORY: Do you have any history of heart disease? (e.g., heart attack, congestive heart failure)      HTN, pulmonary hypertension,  8. LUNG HISTORY: Do you have any history of lung disease?  (e.g., pulmonary embolus, asthma, emphysema)     No 10. OTHER  SYMPTOMS: Do you have any other symptoms? (e.g., runny nose, wheezing, chest pain)       Coughing, hoarseness, shortness of breath  Protocols used: Cough - Acute Non-Productive-A-AH

## 2024-07-06 NOTE — Progress Notes (Signed)
 Assessment & Plan Acute non-recurrent frontal sinusitis - Education provided on sinus infections.  - Possible reinfection or persistent pulmonary inflammation. Orders: .  doxycycline  (VIBRA -TABS) 100 MG tablet; Take 1 tablet (100 mg total) by mouth 2 (two) times daily for 7 days. .  predniSONE  (STERAPRED UNI-PAK 21 TAB) 10 MG (21) TBPK tablet; As directed x 6 days    Follow up plan: Return in 3 months (on 09/23/2024), or if symptoms worsen or fail to improve, for chronic follow-up with PCP.  Niki Rung, MSN, APRN, FNP-C  Subjective:  HPI: Madison Rowe is a 73 y.o. female presenting on 07/06/2024 for Cough (Cough, HA, hoarse, Scratchy throat, fatigued, nasal congestion (slight brown in color), sob with exertion /Covid 05/20/24 - then URI first of Oct. - seen 10/10 at [facility] - completed Augmentin  that was given.)  Discussed the use of AI scribe software for clinical note transcription with the patient, who gave verbal consent to proceed.  She has been experiencing a persistent cough, hoarseness, and nasal congestion since an upper respiratory infection at the beginning of October, following a COVID-19 infection last month. The cough has worsened over time, and hoarseness has been present for approximately three weeks.  She was previously tested for flu and COVID-19, both of which were negative. She completed a course of Augmentin , taking it twice a day for seven days, but her symptoms have not fully resolved. She has been managing her symptoms with Mucinex DM, Sudafed during the day, and NyQuil at night. Recently, she started taking Claritin after her husband, who also had a bad cough, was advised to take it by his doctor, but she did not notice significant improvement.  She experiences shortness of breath with exertion, particularly when playing with her granddaughter. Over the weekend, she felt 'something funny' when breathing deeply, but does not currently feel it. No wheezing or current  shortness of breath. She reports experiencing pressure in her eyebrows.  Her husband became ill on October 18th, and she wonders if she might have been reinfected, although she was told he was not contagious. Despite her symptoms, she reports sleeping well at night, although she wakes up with nasal congestion and drainage.        ROS: Negative unless specifically indicated above in HPI.   Relevant past medical history reviewed and updated as indicated.   Allergies and medications reviewed and updated.   Current Outpatient Medications:  .  alendronate (FOSAMAX) 70 MG tablet, Take 70 mg by mouth once a week., Disp: , Rfl:  .  atorvastatin  (LIPITOR) 40 MG tablet, Take 1 tablet (40 mg total) by mouth daily., Disp: 90 tablet, Rfl: 3 .  Cholecalciferol  50 MCG (2000 UT) TABS, Take 1 tablet (2,000 Units total) by mouth daily., Disp: 90 tablet, Rfl: 1 .  clobetasol  cream (TEMOVATE ) 0.05 %, APPLY EXTERNALLY TO THE AFFECTED AREA TWICE DAILY, Disp: 60 g, Rfl: 1 .  Dermatological Products, Misc. (DERMEND BRUISE FORMULA) LOTN, as needed. bruising, Disp: , Rfl:  .  Dietary Management Product (FOSTEUM PLUS) CAPS, Take 1 capsule by mouth 2 (two) times daily., Disp: 180 capsule, Rfl: 1 .  doxycycline  (VIBRA -TABS) 100 MG tablet, Take 1 tablet (100 mg total) by mouth 2 (two) times daily for 7 days., Disp: 14 tablet, Rfl: 0 .  estradiol (ESTRACE) 0.1 MG/GM vaginal cream, SMARTSIG:Gram(s) Topical 3 Times a Week, Disp: , Rfl:  .  fluticasone  (FLONASE ) 50 MCG/ACT nasal spray, Place 2 sprays into both nostrils daily., Disp: , Rfl:  .  furosemide  (LASIX ) 20 MG tablet, TAKE 1 TABLET(20 MG) BY MOUTH DAILY, Disp: 90 tablet, Rfl: 0 .  loratadine (CLARITIN) 10 MG tablet, Take 10 mg by mouth daily., Disp: , Rfl:  .  predniSONE  (STERAPRED UNI-PAK 21 TAB) 10 MG (21) TBPK tablet, As directed x 6 days, Disp: 21 tablet, Rfl: 0  Allergies  Allergen Reactions  . Alendronate Sodium [Alendronate Sodium]     nausea  .  Olmesartan  Other (See Comments)    quizzy  . Codeine Nausea And Vomiting    Objective:   BP 124/78   Pulse 92   Temp 97.8 F (36.6 C)   Resp 18   Ht 5' 3 (1.6 m)   Wt 188 lb (85.3 kg)   SpO2 95%   BMI 33.30 kg/m    Physical Exam Vitals reviewed.  Constitutional:      General: She is not in acute distress.    Appearance: Normal appearance. She is not ill-appearing, toxic-appearing or diaphoretic.  HENT:     Head: Normocephalic and atraumatic.     Right Ear: Tympanic membrane, ear canal and external ear normal. There is impacted cerumen.     Left Ear: Tympanic membrane, ear canal and external ear normal. There is no impacted cerumen.     Nose: Rhinorrhea present. No congestion. Rhinorrhea is clear.     Right Sinus: Frontal sinus tenderness present. No maxillary sinus tenderness.     Left Sinus: Frontal sinus tenderness present. No maxillary sinus tenderness.     Mouth/Throat:     Mouth: Mucous membranes are moist.     Pharynx: Oropharynx is clear. No oropharyngeal exudate or posterior oropharyngeal erythema.  Eyes:     General: No scleral icterus.       Right eye: No discharge.        Left eye: No discharge.     Conjunctiva/sclera: Conjunctivae normal.  Cardiovascular:     Rate and Rhythm: Normal rate and regular rhythm.     Heart sounds: Normal heart sounds. No murmur heard.    No friction rub. No gallop.  Pulmonary:     Effort: Pulmonary effort is normal. No respiratory distress.     Breath sounds: Normal breath sounds. No stridor. No wheezing, rhonchi or rales.  Musculoskeletal:        General: Normal range of motion.     Cervical back: Normal range of motion.  Lymphadenopathy:     Cervical: No cervical adenopathy.  Skin:    General: Skin is warm and dry.     Capillary Refill: Capillary refill takes less than 2 seconds.  Neurological:     General: No focal deficit present.     Mental Status: She is alert and oriented to person, place, and time. Mental status  is at baseline.  Psychiatric:        Mood and Affect: Mood normal.        Behavior: Behavior normal.        Thought Content: Thought content normal.        Judgment: Judgment normal.        l

## 2024-07-13 ENCOUNTER — Other Ambulatory Visit: Payer: Self-pay | Admitting: Internal Medicine

## 2024-07-30 ENCOUNTER — Ambulatory Visit: Admitting: Podiatry

## 2024-07-30 ENCOUNTER — Encounter: Payer: Self-pay | Admitting: Podiatry

## 2024-07-30 DIAGNOSIS — M21619 Bunion of unspecified foot: Secondary | ICD-10-CM | POA: Diagnosis not present

## 2024-07-30 DIAGNOSIS — M7752 Other enthesopathy of left foot: Secondary | ICD-10-CM | POA: Diagnosis not present

## 2024-07-30 DIAGNOSIS — Z780 Asymptomatic menopausal state: Secondary | ICD-10-CM | POA: Diagnosis not present

## 2024-07-30 DIAGNOSIS — M779 Enthesopathy, unspecified: Secondary | ICD-10-CM

## 2024-07-30 DIAGNOSIS — M8580 Other specified disorders of bone density and structure, unspecified site: Secondary | ICD-10-CM | POA: Diagnosis not present

## 2024-07-30 DIAGNOSIS — M2041 Other hammer toe(s) (acquired), right foot: Secondary | ICD-10-CM | POA: Diagnosis not present

## 2024-07-30 MED ORDER — TRIAMCINOLONE ACETONIDE 10 MG/ML IJ SUSP
10.0000 mg | Freq: Once | INTRAMUSCULAR | Status: AC
  Administered 2024-07-30: 10 mg via INTRA_ARTICULAR

## 2024-07-30 NOTE — Progress Notes (Signed)
 Subjective:   Patient ID: Madison Rowe, female   DOB: 73 y.o.   MRN: [id number]   HPI Patient states continuing to have pain on the top of the left foot and states she is not sure how much relief she had but at least several months.  Also has discussions about her bunion and hammertoe right foot and lesion forming between the   ROS      Objective:  Physical Exam  Neurovascular status intact with significant discomfort into the midfoot left and extensor expansion with patient not a good historian to how much relief she has achieved with medication.  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  I am then referring to physician in 2 months and if symptoms have returned she wants to consider nerve resection or other procedure that might be of benefit.  All questions answered today.  I also discussed hammertoe bunion and at this point I dispensed a cushion for the second toe and did discuss surgery could be done but would like to avoid that if possible

## 2024-08-11 ENCOUNTER — Other Ambulatory Visit: Payer: Self-pay | Admitting: Internal Medicine

## 2024-08-11 DIAGNOSIS — I10 Essential (primary) hypertension: Secondary | ICD-10-CM

## 2024-08-11 DIAGNOSIS — I272 Pulmonary hypertension, unspecified: Secondary | ICD-10-CM

## 2024-09-14 ENCOUNTER — Other Ambulatory Visit: Payer: Self-pay | Admitting: Internal Medicine

## 2024-09-14 DIAGNOSIS — M810 Age-related osteoporosis without current pathological fracture: Secondary | ICD-10-CM

## 2024-09-29 ENCOUNTER — Ambulatory Visit

## 2024-09-29 DIAGNOSIS — M19072 Primary osteoarthritis, left ankle and foot: Secondary | ICD-10-CM | POA: Diagnosis not present

## 2024-09-29 DIAGNOSIS — L600 Ingrowing nail: Secondary | ICD-10-CM

## 2024-09-29 DIAGNOSIS — M7752 Other enthesopathy of left foot: Secondary | ICD-10-CM | POA: Diagnosis not present

## 2024-09-29 NOTE — Progress Notes (Signed)
 "  Subjective:  Patient ID: Madison Rowe, female    DOB: [birth year],  MRN: [id number]  Chief Complaint  Patient presents with   Arthritis    Rm11 Right foot pain     Discussed the use of AI scribe software for clinical note transcription with the patient, who gave verbal consent to proceed.  History of Present Illness Madison Rowe is a 74 year old female with left midfoot osteoarthritis and distal ingrowing nail of the left hallux who presents for follow-up of left foot pain and discussion of long-term management.  She received a corticosteroid injection to the left midfoot in December 2025 with ongoing relief and no significant current pain. Symptoms fluctuate but remain well controlled with supportive footwear.  Her lateral left hallux ingrown nail was treated with nail trimming in December 2025 with good relief. She is now minimally symptomatic with mild sensation possibly from nail regrowth and trims the nail herself. She has noticed progressive curling of her toenails with age.      Review of Systems: Negative except as noted in the HPI. Denies N/V/F/Ch.  Past Medical History:  Diagnosis Date   Allergy    Anemia    Arthritis    Osteoporosis 2010   Vitamin D  deficient osteomalacia 2010   Current Medications[1]  Tobacco Use History[2]  Allergies[3] Objective:   Constitutional Well developed. Well nourished. Oriented to person, place, and time.  Vascular Dorsalis pedis pulses palpable bilaterally. Posterior tibial pulses palpable bilaterally. Capillary refill normal to all digits.  No cyanosis or clubbing noted. Pedal hair growth normal.  Neurologic Normal speech. Epicritic sensation to light touch grossly present bilaterally. Negative tinel sign at tarsal tunnel bilaterally.   Dermatologic Skin texture and turgor are within normal limits.  Left hallux nail lateral border has distal ingrowing with pain to palpation at distal margin of nail. No pain to palpation  of proximal nail fold. No erythema, edema, or signs of infection.  No open wounds. No skin lesions. Well healed scar left 2nd interspace from previous neuroma excision.   Musculoskeletal: 5/5 strength to pedal invertors, evertors, plantarflexors, and dorsiflexors. Hindfoot rectus. Hindfoot ROM without pain and WNL.  Normal ankle joint ROM without Equinus. Ankle joint ROM pain free.  Midfoot ROM decreased and painful. Patient has pain to palpation of dorsal midfoot most prominent at 2/3 TMT. Metatarsus adductus present. Mild, asymptomatic hammertoe deformity.    Radiographs: Taken and reviewed. Decreased joint space with subchondral sclerosis noted at 2/3 TMT. Minimal arthritic changes to 1st TMT. Remaining pedal joints have preserved joint space without arthritic changes. Metatarsus adductus deformity present. No fractures or other acute osseous abnormalities identified.    Assessment:   1. Tendinitis of left foot   2. Ingrowing toenail of left foot [L60.0]   3. Arthritis of left midfoot      Plan:  Patient was evaluated and treated and all questions answered.  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 No significant pain currently. Discussed deep peroneal neurectomy as a surgical option if pain recurs, with diagnostic nerve block as a preliminary step. Reviewed risks including permanent numbness, increased hammer toe risk, and potential joint breakdown. - Discussed deep peroneal neurectomy as a long-term option if pain recurs, including rationale and expected outcomes. - Explained the process and purpose of a diagnostic nerve block prior to considering surgery. She will come to clinic when she has recurrent pain for DPN injection.  - Reviewed  risks of neurectomy: permanent numbness between the first and second toes, possible increased risk of hammer toes, and theoretical risk of joint  breakdown. - Clarified that the procedure would be performed under general anesthesia as outpatient surgery if pursued.  Distal ingrowing nail of left hallux Distal ingrowing nail at the distal lateral aspect of the left hallux. Mild awareness, no significant discomfort. Nail debrided without incident. - Debrided the distal lateral aspect of the left hallux nail without incident.      No follow-ups on file.   Prentice Ovens, DPM AACFAS Fellowship Trained Podiatric Surgeon [facility]     [1]  Current Outpatient Medications:    alendronate (FOSAMAX) 70 MG tablet, Take 70 mg by mouth once a week., Disp: , Rfl:    atorvastatin  (LIPITOR) 40 MG tablet, Take 1 tablet (40 mg total) by mouth daily., Disp: 90 tablet, Rfl: 3   Cholecalciferol  50 MCG (2000 UT) TABS, Take 1 tablet (2,000 Units total) by mouth daily., Disp: 90 tablet, Rfl: 1   clobetasol  cream (TEMOVATE ) 0.05 %, APPLY EXTERNALLY TO THE AFFECTED AREA TWICE DAILY, Disp: 60 g, Rfl: 1   Dermatological Products, Misc. (DERMEND BRUISE FORMULA) LOTN, as needed. bruising, Disp: , Rfl:    Dietary Management Product (FOSTEUM PLUS) CAPS, Take 1 capsule by mouth 2 times daily., Disp: 180 capsule, Rfl: 1   estradiol (ESTRACE) 0.1 MG/GM vaginal cream, SMARTSIG:Gram(s) Topical 3 Times a Week, Disp: , Rfl:    fluticasone  (FLONASE ) 50 MCG/ACT nasal spray, Place 2 sprays into both nostrils daily., Disp: , Rfl:    furosemide  (LASIX ) 20 MG tablet, TAKE 1 TABLET(20 MG) BY MOUTH DAILY, Disp: 90 tablet, Rfl: 0   loratadine (CLARITIN) 10 MG tablet, Take 10 mg by mouth daily., Disp: , Rfl:    predniSONE  (STERAPRED UNI-PAK 21 TAB) 10 MG (21) TBPK tablet, As directed x 6 days, Disp: 21 tablet, Rfl: 0 [2]  Social History Tobacco Use  Smoking Status Never  Smokeless Tobacco Never  [3]  Allergies Allergen Reactions   Alendronate Sodium [Alendronate Sodium]     nausea   Olmesartan  Other (See Comments)    quizzy   Codeine Nausea And  Vomiting   "

## 2024-10-12 ENCOUNTER — Ambulatory Visit: Payer: Self-pay

## 2024-10-12 NOTE — Telephone Encounter (Signed)
 FYI Only or Action Required?: FYI only for provider: appointment scheduled on 10/13/24.  Patient was last seen in primary care on 07/06/2024 by Merlynn Niki FALCON, FNP.  Called Nurse Triage reporting Cough.  Symptoms began several days ago.  Interventions attempted: OTC medications: zicam.  Symptoms are: unchanged.  Triage Disposition: See Physician Within 24 Hours  Patient/caregiver understands and will follow disposition?: Yes   Message from Alfonso HERO sent at 10/12/2024 12:18 PM EST  Reason for Triage: patient complaining of sore throat,laryngitis, green mucus , blowing nose sometime blood comes out     Reason for Disposition  SEVERE throat pain (e.g., excruciating)  Answer Assessment - Initial Assessment Questions No available appts with pcp. Scheduled 10/13/24  Advised call back or ED/911 if symptoms occur/worsen: severe diff breathing, chest pain > 5 min, faint, trouble swallowing/drooling. Patient verbalized understanding.   1. ONSET: When did the throat start hurting? (Hours or days ago)      wednesday 2. SEVERITY: How bad is the sore throat? (Scale 1-10; mild, moderate or severe)     3/10 3. STREP EXPOSURE: Has there been any exposure to strep within the past week? If Yes, ask: What type of contact occurred?      no 4.  VIRAL SYMPTOMS: Are there any symptoms of a cold, such as a runny nose, cough, hoarse voice or red eyes?      Sore throat, runny nose cough greenish yellow  Blood in phlegm streaks, denies active nose bleeds 5. FEVER: Do you have a fever? If Yes, ask: What is your temperature, how was it measured, and when did it start?     Denies fever chills n/v 6. PUS ON THE TONSILS: Is there pus on the tonsils in the back of your throat?     no 7. OTHER SYMPTOMS: Do you have any other symptoms? (e.g., difficulty breathing, headache, rash)  Denies diff breath, chest pain, faint, diff swallowing/drooling, fever chills n/v  Protocols used: Sore  Throat-A-AH

## 2024-10-13 ENCOUNTER — Ambulatory Visit: Admitting: Family

## 2024-10-13 VITALS — BP 120/72 | HR 80 | Temp 97.6°F | Ht 63.0 in | Wt 189.0 lb

## 2024-10-13 DIAGNOSIS — J069 Acute upper respiratory infection, unspecified: Secondary | ICD-10-CM

## 2024-10-13 DIAGNOSIS — R051 Acute cough: Secondary | ICD-10-CM

## 2024-10-13 MED ORDER — AMOXICILLIN-POT CLAVULANATE 875-125 MG PO TABS: 1.0000 | ORAL_TABLET | Freq: Two times a day (BID) | ORAL | 0 refills | Status: AC

## 2024-10-13 MED ORDER — METHYLPREDNISOLONE 4 MG PO TBPK: ORAL_TABLET | ORAL | 0 refills | Status: AC

## 2024-10-14 NOTE — Progress Notes (Signed)
 "  Acute Office Visit  Subjective:     Patient ID: Madison Rowe, female    DOB: 04-13-1951, 74 y.o.   MRN: [id number]  Chief Complaint  Patient presents with   Sore Throat    Sore throat since wednesday    HPI Patient is in today with c/o sore throat, cough, stuffy nose, congestion x 9 days.  Has been taking over-the-counter DayQuil, NyQuil, and Sudafed that has helped some.  However, has not resolved her symptoms.  Denies any sick contacts.  Review of Systems  HENT:  Positive for congestion and sore throat.   Respiratory:  Positive for cough and sputum production.   Cardiovascular: Negative.   Musculoskeletal: Negative.   Neurological: Negative.   Endo/Heme/Allergies: Negative.   Psychiatric/Behavioral: Negative.    All other systems reviewed and are negative.  Past Medical History:  Diagnosis Date   Allergy    Anemia    Arthritis    Osteoporosis 2010   Vitamin D  deficient osteomalacia 2010    Social History   Socioeconomic History   Marital status: Married    Spouse name: Helayne   Number of children: 2   Years of education: Not on file   Highest education level: Master's degree (e.g., MA, MS, MEng, MEd, MSW, MBA)  Occupational History   Occupation: RETIRED  Tobacco Use   Smoking status: Never   Smokeless tobacco: Never  Vaping Use   Vaping status: Never Used  Substance and Sexual Activity   Alcohol use: Yes    Comment: Occasional   Drug use: No   Sexual activity: Yes    Birth control/protection: Surgical  Other Topics Concern   Not on file  Social History Narrative   Lives with husband/2025 and 1 dog   Social Drivers of Health   Tobacco Use: Low Risk (09/29/2024)   Patient History    Smoking Tobacco Use: Never    Smokeless Tobacco Use: Never    Passive Exposure: Not on file  Financial Resource Strain: Low Risk (05/19/2024)   Overall Financial Resource Strain (CARDIA)    Difficulty of Paying Living Expenses: Not hard at all  Food Insecurity: No  Food Insecurity (05/19/2024)   Epic    Worried About Programme Researcher, Broadcasting/film/video in the Last Year: Never true    Ran Out of Food in the Last Year: Never true  Transportation Needs: No Transportation Needs (05/19/2024)   Epic    Lack of Transportation (Medical): No    Lack of Transportation (Non-Medical): No  Physical Activity: Sufficiently Active (05/19/2024)   Exercise Vital Sign    Days of Exercise per Week: 3 days    Minutes of Exercise per Session: 50 min  Stress: No Stress Concern Present (05/19/2024)   [employer] of Occupational Health - Occupational Stress Questionnaire    Feeling of Stress: Not at all  Social Connections: Socially Integrated (05/19/2024)   Social Connection and Isolation Panel    Frequency of Communication with Friends and Family: More than three times a week    Frequency of Social Gatherings with Friends and Family: Three times a week    Attends Religious Services: More than 4 times per year    Active Member of Clubs or Organizations: Yes    Attends Banker Meetings: More than 4 times per year    Marital Status: Married  Catering Manager Violence: Not At Risk (05/19/2024)   Epic    Fear of Current or Ex-Partner: No    Emotionally  Abused: No    Physically Abused: No    Sexually Abused: No  Depression (PHQ2-9): Low Risk (07/06/2024)   Depression (PHQ2-9)    PHQ-2 Score: 0  Alcohol Screen: Low Risk (05/19/2024)   Alcohol Screen    Last Alcohol Screening Score (AUDIT): 2  Housing: Low Risk (05/19/2024)   Epic    Unable to Pay for Housing in the Last Year: No    Number of Times Moved in the Last Year: 0    Homeless in the Last Year: No  Utilities: Not At Risk (05/19/2024)   Epic    Threatened with loss of utilities: No  Health Literacy: Adequate Health Literacy (05/19/2024)   B1300 Health Literacy    Frequency of need for help with medical instructions: Never    Past Surgical History:  Procedure Laterality Date   BREAST SURGERY     COLONOSCOPY   07/09/2022   2018-polyp   ECTOPIC PREGNANCY SURGERY     FOOT SURGERY Left    laproscopy  1982   TONSILLECTOMY      Family History  Problem Relation Age of Onset   Hypertension Mother    Kidney disease Father    Cancer Sister    Kidney disease Maternal Grandfather    Heart disease Neg Hx    Colon cancer Neg Hx    Esophageal cancer Neg Hx    Rectal cancer Neg Hx    Stomach cancer Neg Hx    Colon polyps Neg Hx     Allergies[1]  Medications Ordered Prior to Encounter[2]  BP 120/72   Pulse 80   Temp 97.6 F (36.4 C) (Temporal)   Ht 5' 3 (1.6 m)   Wt 189 lb (85.7 kg)   SpO2 99%   BMI 33.48 kg/m chart      Objective:    BP 120/72   Pulse 80   Temp 97.6 F (36.4 C) (Temporal)   Ht 5' 3 (1.6 m)   Wt 189 lb (85.7 kg)   SpO2 99%   BMI 33.48 kg/m    Physical Exam Vitals and nursing note reviewed.  Constitutional:      Appearance: She is well-developed.  HENT:     Right Ear: Tympanic membrane and ear canal normal.     Left Ear: Tympanic membrane and ear canal normal.     Nose: Congestion present.     Mouth/Throat:     Mouth: Mucous membranes are moist. Mucous membranes are pale.  Cardiovascular:     Rate and Rhythm: Normal rate and regular rhythm.  Pulmonary:     Effort: Pulmonary effort is normal.     Breath sounds: Normal breath sounds.  Abdominal:     Palpations: Abdomen is soft.  Skin:    General: Skin is warm and dry.  Neurological:     General: No focal deficit present.     Mental Status: She is alert and oriented to person, place, and time.  Psychiatric:        Mood and Affect: Mood normal.        Behavior: Behavior normal.     No results found for any visits on 10/13/24.      Assessment & Plan:   Problem List Items Addressed This Visit   None Visit Diagnoses       Bacterial upper respiratory infection    -  Primary     Acute cough           Meds ordered this encounter  Medications   amoxicillin -clavulanate (AUGMENTIN )  875-125 MG tablet    Sig: Take 1 tablet by mouth 2 (two) times daily.    Dispense:  20 tablet    Refill:  0   methylPREDNISolone  (MEDROL  DOSEPAK) 4 MG TBPK tablet    Sig: As directed    Dispense:  21 tablet    Refill:  0   Call the office if symptoms worsen or persist. Recheck as scheduled and sooner as needed.  No follow-ups on file.  Elany Felix B Tracker Mance, FNP      [1]  Allergies Allergen Reactions   Alendronate Sodium [Alendronate Sodium]     nausea   Olmesartan  Other (See Comments)    quizzy   Codeine Nausea And Vomiting  [2]  Current Outpatient Medications on File Prior to Visit  Medication Sig Dispense Refill   alendronate (FOSAMAX) 70 MG tablet Take 70 mg by mouth once a week.     atorvastatin  (LIPITOR) 40 MG tablet Take 1 tablet (40 mg total) by mouth daily. 90 tablet 3   Cholecalciferol  50 MCG (2000 UT) TABS Take 1 tablet (2,000 Units total) by mouth daily. 90 tablet 1   clobetasol  cream (TEMOVATE ) 0.05 % APPLY EXTERNALLY TO THE AFFECTED AREA TWICE DAILY 60 g 1   Dermatological Products, Misc. (DERMEND BRUISE FORMULA) LOTN as needed. bruising     Dietary Management Product (FOSTEUM PLUS) CAPS Take 1 capsule by mouth 2 times daily. 180 capsule 1   estradiol (ESTRACE) 0.1 MG/GM vaginal cream SMARTSIG:Gram(s) Topical 3 Times a Week     furosemide  (LASIX ) 20 MG tablet TAKE 1 TABLET(20 MG) BY MOUTH DAILY 90 tablet 0   loratadine (CLARITIN) 10 MG tablet Take 10 mg by mouth daily.     No current facility-administered medications on file prior to visit.   "
